# Patient Record
Sex: Female | Born: 1935
Health system: Southern US, Community
[De-identification: ages and names within clinical notes are randomized; demographics above are authoritative.]

## PROBLEM LIST (undated history)

## (undated) DIAGNOSIS — F32A Depression, unspecified: Secondary | ICD-10-CM

## (undated) DIAGNOSIS — F419 Anxiety disorder, unspecified: Secondary | ICD-10-CM

## (undated) DIAGNOSIS — M8580 Other specified disorders of bone density and structure, unspecified site: Secondary | ICD-10-CM

## (undated) DIAGNOSIS — M419 Scoliosis, unspecified: Secondary | ICD-10-CM

## (undated) DIAGNOSIS — Z8719 Personal history of other diseases of the digestive system: Secondary | ICD-10-CM

## (undated) DIAGNOSIS — K52832 Lymphocytic colitis: Secondary | ICD-10-CM

## (undated) DIAGNOSIS — I10 Essential (primary) hypertension: Secondary | ICD-10-CM

## (undated) DIAGNOSIS — K219 Gastro-esophageal reflux disease without esophagitis: Secondary | ICD-10-CM

## (undated) DIAGNOSIS — G8929 Other chronic pain: Secondary | ICD-10-CM

## (undated) DIAGNOSIS — D472 Monoclonal gammopathy: Secondary | ICD-10-CM

## (undated) DIAGNOSIS — M549 Dorsalgia, unspecified: Secondary | ICD-10-CM

## (undated) DIAGNOSIS — E785 Hyperlipidemia, unspecified: Secondary | ICD-10-CM

## (undated) DIAGNOSIS — F329 Major depressive disorder, single episode, unspecified: Secondary | ICD-10-CM

## (undated) HISTORY — DX: Other specified disorders of bone density and structure, unspecified site: M85.80

## (undated) HISTORY — DX: Hyperlipidemia, unspecified: E78.5

## (undated) HISTORY — DX: Gastro-esophageal reflux disease without esophagitis: K21.9

## (undated) HISTORY — DX: Scoliosis, unspecified: M41.9

## (undated) HISTORY — PX: HERNIA REPAIR: SHX51

## (undated) HISTORY — PX: NECK SURGERY: SHX720

## (undated) HISTORY — DX: Personal history of other diseases of the digestive system: Z87.19

## (undated) HISTORY — DX: Monoclonal gammopathy: D47.2

## (undated) HISTORY — PX: NISSEN FUNDOPLICATION: SHX2091

## (undated) HISTORY — DX: Lymphocytic colitis: K52.832

---

## 1971-07-27 HISTORY — PX: ABDOMINAL HYSTERECTOMY: SHX81

## 2011-07-08 DIAGNOSIS — K52832 Lymphocytic colitis: Secondary | ICD-10-CM | POA: Insufficient documentation

## 2011-07-22 DIAGNOSIS — Z87898 Personal history of other specified conditions: Secondary | ICD-10-CM | POA: Insufficient documentation

## 2011-07-29 DIAGNOSIS — E538 Deficiency of other specified B group vitamins: Secondary | ICD-10-CM | POA: Diagnosis not present

## 2011-08-02 DIAGNOSIS — G609 Hereditary and idiopathic neuropathy, unspecified: Secondary | ICD-10-CM | POA: Diagnosis not present

## 2011-08-02 DIAGNOSIS — R55 Syncope and collapse: Secondary | ICD-10-CM | POA: Diagnosis not present

## 2011-08-02 DIAGNOSIS — R269 Unspecified abnormalities of gait and mobility: Secondary | ICD-10-CM | POA: Diagnosis not present

## 2011-08-03 DIAGNOSIS — Z85828 Personal history of other malignant neoplasm of skin: Secondary | ICD-10-CM | POA: Diagnosis not present

## 2011-08-03 DIAGNOSIS — R21 Rash and other nonspecific skin eruption: Secondary | ICD-10-CM | POA: Diagnosis not present

## 2011-08-03 DIAGNOSIS — L570 Actinic keratosis: Secondary | ICD-10-CM | POA: Diagnosis not present

## 2011-08-04 DIAGNOSIS — R55 Syncope and collapse: Secondary | ICD-10-CM | POA: Diagnosis not present

## 2011-08-08 DIAGNOSIS — Z8719 Personal history of other diseases of the digestive system: Secondary | ICD-10-CM | POA: Insufficient documentation

## 2011-08-08 HISTORY — DX: Personal history of other diseases of the digestive system: Z87.19

## 2011-08-12 DIAGNOSIS — R55 Syncope and collapse: Secondary | ICD-10-CM | POA: Diagnosis not present

## 2011-08-18 DIAGNOSIS — R55 Syncope and collapse: Secondary | ICD-10-CM | POA: Diagnosis not present

## 2011-08-20 DIAGNOSIS — R269 Unspecified abnormalities of gait and mobility: Secondary | ICD-10-CM | POA: Diagnosis not present

## 2011-08-20 DIAGNOSIS — R55 Syncope and collapse: Secondary | ICD-10-CM | POA: Diagnosis not present

## 2011-08-20 DIAGNOSIS — G609 Hereditary and idiopathic neuropathy, unspecified: Secondary | ICD-10-CM | POA: Diagnosis not present

## 2011-08-23 DIAGNOSIS — I10 Essential (primary) hypertension: Secondary | ICD-10-CM | POA: Diagnosis not present

## 2011-08-23 DIAGNOSIS — R55 Syncope and collapse: Secondary | ICD-10-CM | POA: Diagnosis not present

## 2011-08-24 DIAGNOSIS — I10 Essential (primary) hypertension: Secondary | ICD-10-CM | POA: Diagnosis not present

## 2011-09-06 DIAGNOSIS — I10 Essential (primary) hypertension: Secondary | ICD-10-CM | POA: Diagnosis not present

## 2011-09-06 DIAGNOSIS — I672 Cerebral atherosclerosis: Secondary | ICD-10-CM | POA: Diagnosis not present

## 2011-09-06 DIAGNOSIS — R404 Transient alteration of awareness: Secondary | ICD-10-CM | POA: Diagnosis not present

## 2011-09-06 DIAGNOSIS — R269 Unspecified abnormalities of gait and mobility: Secondary | ICD-10-CM | POA: Diagnosis not present

## 2011-09-06 DIAGNOSIS — M5137 Other intervertebral disc degeneration, lumbosacral region: Secondary | ICD-10-CM | POA: Diagnosis not present

## 2011-09-07 DIAGNOSIS — D485 Neoplasm of uncertain behavior of skin: Secondary | ICD-10-CM | POA: Diagnosis not present

## 2011-09-07 DIAGNOSIS — L918 Other hypertrophic disorders of the skin: Secondary | ICD-10-CM | POA: Diagnosis not present

## 2011-09-07 DIAGNOSIS — R21 Rash and other nonspecific skin eruption: Secondary | ICD-10-CM | POA: Diagnosis not present

## 2011-09-20 DIAGNOSIS — T887XXA Unspecified adverse effect of drug or medicament, initial encounter: Secondary | ICD-10-CM | POA: Diagnosis not present

## 2011-09-27 DIAGNOSIS — E538 Deficiency of other specified B group vitamins: Secondary | ICD-10-CM | POA: Diagnosis not present

## 2011-11-08 DIAGNOSIS — Z87898 Personal history of other specified conditions: Secondary | ICD-10-CM | POA: Diagnosis not present

## 2011-11-08 DIAGNOSIS — I10 Essential (primary) hypertension: Secondary | ICD-10-CM | POA: Diagnosis not present

## 2011-11-10 DIAGNOSIS — E042 Nontoxic multinodular goiter: Secondary | ICD-10-CM | POA: Diagnosis not present

## 2011-11-10 DIAGNOSIS — R946 Abnormal results of thyroid function studies: Secondary | ICD-10-CM | POA: Diagnosis not present

## 2011-11-11 DIAGNOSIS — K5289 Other specified noninfective gastroenteritis and colitis: Secondary | ICD-10-CM | POA: Diagnosis not present

## 2011-11-11 DIAGNOSIS — I10 Essential (primary) hypertension: Secondary | ICD-10-CM | POA: Diagnosis not present

## 2011-11-11 DIAGNOSIS — N390 Urinary tract infection, site not specified: Secondary | ICD-10-CM | POA: Diagnosis not present

## 2011-11-11 DIAGNOSIS — F329 Major depressive disorder, single episode, unspecified: Secondary | ICD-10-CM | POA: Diagnosis not present

## 2011-11-15 DIAGNOSIS — R946 Abnormal results of thyroid function studies: Secondary | ICD-10-CM | POA: Diagnosis not present

## 2011-11-15 DIAGNOSIS — E042 Nontoxic multinodular goiter: Secondary | ICD-10-CM | POA: Diagnosis not present

## 2011-12-13 DIAGNOSIS — Z79899 Other long term (current) drug therapy: Secondary | ICD-10-CM | POA: Diagnosis not present

## 2011-12-13 DIAGNOSIS — I10 Essential (primary) hypertension: Secondary | ICD-10-CM | POA: Diagnosis not present

## 2011-12-14 DIAGNOSIS — E538 Deficiency of other specified B group vitamins: Secondary | ICD-10-CM | POA: Diagnosis not present

## 2011-12-14 DIAGNOSIS — D472 Monoclonal gammopathy: Secondary | ICD-10-CM | POA: Diagnosis not present

## 2011-12-14 DIAGNOSIS — E8809 Other disorders of plasma-protein metabolism, not elsewhere classified: Secondary | ICD-10-CM | POA: Diagnosis not present

## 2011-12-15 DIAGNOSIS — Z85828 Personal history of other malignant neoplasm of skin: Secondary | ICD-10-CM | POA: Diagnosis not present

## 2011-12-15 DIAGNOSIS — L570 Actinic keratosis: Secondary | ICD-10-CM | POA: Diagnosis not present

## 2011-12-15 DIAGNOSIS — C44320 Squamous cell carcinoma of skin of unspecified parts of face: Secondary | ICD-10-CM | POA: Diagnosis not present

## 2011-12-21 DIAGNOSIS — C9000 Multiple myeloma not having achieved remission: Secondary | ICD-10-CM | POA: Diagnosis not present

## 2011-12-24 DIAGNOSIS — C9000 Multiple myeloma not having achieved remission: Secondary | ICD-10-CM | POA: Diagnosis not present

## 2011-12-24 DIAGNOSIS — E860 Dehydration: Secondary | ICD-10-CM | POA: Diagnosis not present

## 2011-12-24 DIAGNOSIS — R110 Nausea: Secondary | ICD-10-CM | POA: Diagnosis not present

## 2011-12-24 DIAGNOSIS — Z5111 Encounter for antineoplastic chemotherapy: Secondary | ICD-10-CM | POA: Diagnosis not present

## 2011-12-27 DIAGNOSIS — H2510 Age-related nuclear cataract, unspecified eye: Secondary | ICD-10-CM | POA: Diagnosis not present

## 2011-12-27 DIAGNOSIS — H2589 Other age-related cataract: Secondary | ICD-10-CM | POA: Diagnosis not present

## 2011-12-27 DIAGNOSIS — H40039 Anatomical narrow angle, unspecified eye: Secondary | ICD-10-CM | POA: Diagnosis not present

## 2012-01-04 DIAGNOSIS — H40039 Anatomical narrow angle, unspecified eye: Secondary | ICD-10-CM | POA: Diagnosis not present

## 2012-01-04 DIAGNOSIS — H3540 Unspecified peripheral retinal degeneration: Secondary | ICD-10-CM | POA: Diagnosis not present

## 2012-01-04 DIAGNOSIS — H35019 Changes in retinal vascular appearance, unspecified eye: Secondary | ICD-10-CM | POA: Diagnosis not present

## 2012-02-07 DIAGNOSIS — E042 Nontoxic multinodular goiter: Secondary | ICD-10-CM | POA: Diagnosis not present

## 2012-02-10 DIAGNOSIS — M899 Disorder of bone, unspecified: Secondary | ICD-10-CM | POA: Diagnosis not present

## 2012-02-10 DIAGNOSIS — M949 Disorder of cartilage, unspecified: Secondary | ICD-10-CM | POA: Diagnosis not present

## 2012-02-10 DIAGNOSIS — E538 Deficiency of other specified B group vitamins: Secondary | ICD-10-CM | POA: Diagnosis not present

## 2012-02-10 DIAGNOSIS — C9000 Multiple myeloma not having achieved remission: Secondary | ICD-10-CM | POA: Diagnosis not present

## 2012-02-21 DIAGNOSIS — I10 Essential (primary) hypertension: Secondary | ICD-10-CM | POA: Diagnosis not present

## 2012-02-21 DIAGNOSIS — M5137 Other intervertebral disc degeneration, lumbosacral region: Secondary | ICD-10-CM | POA: Diagnosis not present

## 2012-02-21 DIAGNOSIS — I672 Cerebral atherosclerosis: Secondary | ICD-10-CM | POA: Diagnosis not present

## 2012-02-21 DIAGNOSIS — R269 Unspecified abnormalities of gait and mobility: Secondary | ICD-10-CM | POA: Diagnosis not present

## 2012-02-21 DIAGNOSIS — G4710 Hypersomnia, unspecified: Secondary | ICD-10-CM | POA: Diagnosis not present

## 2012-03-09 DIAGNOSIS — G4710 Hypersomnia, unspecified: Secondary | ICD-10-CM | POA: Diagnosis not present

## 2012-03-20 DIAGNOSIS — R946 Abnormal results of thyroid function studies: Secondary | ICD-10-CM | POA: Diagnosis not present

## 2012-03-20 DIAGNOSIS — E042 Nontoxic multinodular goiter: Secondary | ICD-10-CM | POA: Diagnosis not present

## 2012-04-06 DIAGNOSIS — C9000 Multiple myeloma not having achieved remission: Secondary | ICD-10-CM | POA: Diagnosis not present

## 2012-04-06 DIAGNOSIS — E538 Deficiency of other specified B group vitamins: Secondary | ICD-10-CM | POA: Diagnosis not present

## 2012-04-07 DIAGNOSIS — Z1231 Encounter for screening mammogram for malignant neoplasm of breast: Secondary | ICD-10-CM | POA: Diagnosis not present

## 2012-04-10 DIAGNOSIS — G4710 Hypersomnia, unspecified: Secondary | ICD-10-CM | POA: Diagnosis not present

## 2012-04-10 DIAGNOSIS — M5137 Other intervertebral disc degeneration, lumbosacral region: Secondary | ICD-10-CM | POA: Diagnosis not present

## 2012-04-10 DIAGNOSIS — I10 Essential (primary) hypertension: Secondary | ICD-10-CM | POA: Diagnosis not present

## 2012-04-10 DIAGNOSIS — I672 Cerebral atherosclerosis: Secondary | ICD-10-CM | POA: Diagnosis not present

## 2012-04-10 DIAGNOSIS — R269 Unspecified abnormalities of gait and mobility: Secondary | ICD-10-CM | POA: Diagnosis not present

## 2012-04-24 DIAGNOSIS — G4710 Hypersomnia, unspecified: Secondary | ICD-10-CM | POA: Diagnosis not present

## 2012-05-01 DIAGNOSIS — Z23 Encounter for immunization: Secondary | ICD-10-CM | POA: Diagnosis not present

## 2012-05-09 DIAGNOSIS — I10 Essential (primary) hypertension: Secondary | ICD-10-CM | POA: Diagnosis not present

## 2012-05-09 DIAGNOSIS — G4700 Insomnia, unspecified: Secondary | ICD-10-CM | POA: Diagnosis not present

## 2012-05-09 DIAGNOSIS — F411 Generalized anxiety disorder: Secondary | ICD-10-CM | POA: Diagnosis not present

## 2012-05-15 DIAGNOSIS — R269 Unspecified abnormalities of gait and mobility: Secondary | ICD-10-CM | POA: Diagnosis not present

## 2012-05-15 DIAGNOSIS — G4710 Hypersomnia, unspecified: Secondary | ICD-10-CM | POA: Diagnosis not present

## 2012-05-15 DIAGNOSIS — I10 Essential (primary) hypertension: Secondary | ICD-10-CM | POA: Diagnosis not present

## 2012-05-15 DIAGNOSIS — H8190 Unspecified disorder of vestibular function, unspecified ear: Secondary | ICD-10-CM | POA: Diagnosis not present

## 2012-05-15 DIAGNOSIS — M5137 Other intervertebral disc degeneration, lumbosacral region: Secondary | ICD-10-CM | POA: Diagnosis not present

## 2012-05-15 DIAGNOSIS — I672 Cerebral atherosclerosis: Secondary | ICD-10-CM | POA: Diagnosis not present

## 2012-06-06 DIAGNOSIS — D472 Monoclonal gammopathy: Secondary | ICD-10-CM | POA: Diagnosis not present

## 2012-06-06 DIAGNOSIS — C9000 Multiple myeloma not having achieved remission: Secondary | ICD-10-CM | POA: Diagnosis not present

## 2012-06-06 DIAGNOSIS — E538 Deficiency of other specified B group vitamins: Secondary | ICD-10-CM | POA: Diagnosis not present

## 2012-06-13 DIAGNOSIS — Z85828 Personal history of other malignant neoplasm of skin: Secondary | ICD-10-CM | POA: Diagnosis not present

## 2012-06-15 DIAGNOSIS — C9000 Multiple myeloma not having achieved remission: Secondary | ICD-10-CM | POA: Diagnosis not present

## 2012-06-15 DIAGNOSIS — D472 Monoclonal gammopathy: Secondary | ICD-10-CM | POA: Diagnosis not present

## 2012-06-15 DIAGNOSIS — E538 Deficiency of other specified B group vitamins: Secondary | ICD-10-CM | POA: Diagnosis not present

## 2012-07-04 DIAGNOSIS — H2589 Other age-related cataract: Secondary | ICD-10-CM | POA: Diagnosis not present

## 2012-07-04 DIAGNOSIS — H40039 Anatomical narrow angle, unspecified eye: Secondary | ICD-10-CM | POA: Diagnosis not present

## 2012-07-04 DIAGNOSIS — H2510 Age-related nuclear cataract, unspecified eye: Secondary | ICD-10-CM | POA: Diagnosis not present

## 2012-07-06 DIAGNOSIS — H8190 Unspecified disorder of vestibular function, unspecified ear: Secondary | ICD-10-CM | POA: Diagnosis not present

## 2012-07-10 DIAGNOSIS — H6120 Impacted cerumen, unspecified ear: Secondary | ICD-10-CM | POA: Diagnosis not present

## 2012-07-10 DIAGNOSIS — H902 Conductive hearing loss, unspecified: Secondary | ICD-10-CM | POA: Diagnosis not present

## 2012-07-13 DIAGNOSIS — D485 Neoplasm of uncertain behavior of skin: Secondary | ICD-10-CM | POA: Diagnosis not present

## 2012-07-13 DIAGNOSIS — L821 Other seborrheic keratosis: Secondary | ICD-10-CM | POA: Diagnosis not present

## 2012-07-13 DIAGNOSIS — D492 Neoplasm of unspecified behavior of bone, soft tissue, and skin: Secondary | ICD-10-CM | POA: Diagnosis not present

## 2012-08-03 DIAGNOSIS — H8190 Unspecified disorder of vestibular function, unspecified ear: Secondary | ICD-10-CM | POA: Diagnosis not present

## 2012-08-14 DIAGNOSIS — E538 Deficiency of other specified B group vitamins: Secondary | ICD-10-CM | POA: Diagnosis not present

## 2012-08-14 DIAGNOSIS — H2510 Age-related nuclear cataract, unspecified eye: Secondary | ICD-10-CM | POA: Diagnosis not present

## 2012-08-14 DIAGNOSIS — H2000 Unspecified acute and subacute iridocyclitis: Secondary | ICD-10-CM | POA: Diagnosis not present

## 2012-08-14 DIAGNOSIS — C9000 Multiple myeloma not having achieved remission: Secondary | ICD-10-CM | POA: Diagnosis not present

## 2012-08-14 DIAGNOSIS — H04129 Dry eye syndrome of unspecified lacrimal gland: Secondary | ICD-10-CM | POA: Diagnosis not present

## 2012-08-14 DIAGNOSIS — H2589 Other age-related cataract: Secondary | ICD-10-CM | POA: Diagnosis not present

## 2012-08-15 DIAGNOSIS — K589 Irritable bowel syndrome without diarrhea: Secondary | ICD-10-CM | POA: Diagnosis not present

## 2012-08-15 DIAGNOSIS — Z79899 Other long term (current) drug therapy: Secondary | ICD-10-CM | POA: Diagnosis not present

## 2012-08-17 DIAGNOSIS — R269 Unspecified abnormalities of gait and mobility: Secondary | ICD-10-CM | POA: Diagnosis not present

## 2012-08-17 DIAGNOSIS — I672 Cerebral atherosclerosis: Secondary | ICD-10-CM | POA: Diagnosis not present

## 2012-08-17 DIAGNOSIS — G4710 Hypersomnia, unspecified: Secondary | ICD-10-CM | POA: Diagnosis not present

## 2012-08-17 DIAGNOSIS — I10 Essential (primary) hypertension: Secondary | ICD-10-CM | POA: Diagnosis not present

## 2012-08-17 DIAGNOSIS — M5137 Other intervertebral disc degeneration, lumbosacral region: Secondary | ICD-10-CM | POA: Diagnosis not present

## 2012-08-17 DIAGNOSIS — H8190 Unspecified disorder of vestibular function, unspecified ear: Secondary | ICD-10-CM | POA: Diagnosis not present

## 2012-08-21 DIAGNOSIS — Z87898 Personal history of other specified conditions: Secondary | ICD-10-CM | POA: Diagnosis not present

## 2012-08-21 DIAGNOSIS — R011 Cardiac murmur, unspecified: Secondary | ICD-10-CM | POA: Diagnosis not present

## 2012-08-21 DIAGNOSIS — W19XXXA Unspecified fall, initial encounter: Secondary | ICD-10-CM | POA: Insufficient documentation

## 2012-08-21 DIAGNOSIS — I10 Essential (primary) hypertension: Secondary | ICD-10-CM | POA: Diagnosis not present

## 2012-08-22 DIAGNOSIS — H2000 Unspecified acute and subacute iridocyclitis: Secondary | ICD-10-CM | POA: Diagnosis not present

## 2012-08-29 DIAGNOSIS — Z87898 Personal history of other specified conditions: Secondary | ICD-10-CM | POA: Diagnosis not present

## 2012-08-29 DIAGNOSIS — R011 Cardiac murmur, unspecified: Secondary | ICD-10-CM | POA: Diagnosis not present

## 2012-10-20 DIAGNOSIS — Q675 Congenital deformity of spine: Secondary | ICD-10-CM | POA: Diagnosis not present

## 2012-10-20 DIAGNOSIS — F411 Generalized anxiety disorder: Secondary | ICD-10-CM | POA: Diagnosis not present

## 2012-10-20 DIAGNOSIS — I10 Essential (primary) hypertension: Secondary | ICD-10-CM | POA: Diagnosis not present

## 2012-10-20 DIAGNOSIS — Z23 Encounter for immunization: Secondary | ICD-10-CM | POA: Diagnosis not present

## 2012-12-05 DIAGNOSIS — D472 Monoclonal gammopathy: Secondary | ICD-10-CM | POA: Diagnosis not present

## 2012-12-05 DIAGNOSIS — C9000 Multiple myeloma not having achieved remission: Secondary | ICD-10-CM | POA: Diagnosis not present

## 2012-12-05 DIAGNOSIS — M810 Age-related osteoporosis without current pathological fracture: Secondary | ICD-10-CM | POA: Diagnosis not present

## 2012-12-05 DIAGNOSIS — E538 Deficiency of other specified B group vitamins: Secondary | ICD-10-CM | POA: Diagnosis not present

## 2012-12-12 DIAGNOSIS — M545 Low back pain: Secondary | ICD-10-CM | POA: Diagnosis not present

## 2012-12-19 DIAGNOSIS — D472 Monoclonal gammopathy: Secondary | ICD-10-CM | POA: Diagnosis not present

## 2012-12-19 DIAGNOSIS — C9000 Multiple myeloma not having achieved remission: Secondary | ICD-10-CM | POA: Diagnosis not present

## 2012-12-19 DIAGNOSIS — M810 Age-related osteoporosis without current pathological fracture: Secondary | ICD-10-CM | POA: Diagnosis not present

## 2012-12-19 DIAGNOSIS — E538 Deficiency of other specified B group vitamins: Secondary | ICD-10-CM | POA: Diagnosis not present

## 2012-12-25 ENCOUNTER — Ambulatory Visit: Payer: Self-pay | Admitting: Family Medicine

## 2012-12-25 DIAGNOSIS — M4120 Other idiopathic scoliosis, site unspecified: Secondary | ICD-10-CM | POA: Diagnosis not present

## 2012-12-25 DIAGNOSIS — M25559 Pain in unspecified hip: Secondary | ICD-10-CM | POA: Diagnosis not present

## 2012-12-25 DIAGNOSIS — M545 Low back pain, unspecified: Secondary | ICD-10-CM | POA: Diagnosis not present

## 2012-12-28 DIAGNOSIS — F341 Dysthymic disorder: Secondary | ICD-10-CM | POA: Diagnosis not present

## 2013-01-04 DIAGNOSIS — R1013 Epigastric pain: Secondary | ICD-10-CM | POA: Diagnosis not present

## 2013-01-04 DIAGNOSIS — M545 Low back pain: Secondary | ICD-10-CM | POA: Diagnosis not present

## 2013-01-08 ENCOUNTER — Ambulatory Visit: Payer: Self-pay | Admitting: Family Medicine

## 2013-01-08 DIAGNOSIS — R1013 Epigastric pain: Secondary | ICD-10-CM | POA: Diagnosis not present

## 2013-01-10 DIAGNOSIS — R1013 Epigastric pain: Secondary | ICD-10-CM | POA: Diagnosis not present

## 2013-01-16 DIAGNOSIS — F341 Dysthymic disorder: Secondary | ICD-10-CM | POA: Diagnosis not present

## 2013-01-16 DIAGNOSIS — G4700 Insomnia, unspecified: Secondary | ICD-10-CM | POA: Diagnosis not present

## 2013-01-16 DIAGNOSIS — K219 Gastro-esophageal reflux disease without esophagitis: Secondary | ICD-10-CM | POA: Diagnosis not present

## 2013-01-25 DIAGNOSIS — F411 Generalized anxiety disorder: Secondary | ICD-10-CM | POA: Diagnosis not present

## 2013-01-25 DIAGNOSIS — F329 Major depressive disorder, single episode, unspecified: Secondary | ICD-10-CM | POA: Diagnosis not present

## 2013-02-01 DIAGNOSIS — H2510 Age-related nuclear cataract, unspecified eye: Secondary | ICD-10-CM | POA: Diagnosis not present

## 2013-02-01 DIAGNOSIS — H2589 Other age-related cataract: Secondary | ICD-10-CM | POA: Diagnosis not present

## 2013-02-01 DIAGNOSIS — H35019 Changes in retinal vascular appearance, unspecified eye: Secondary | ICD-10-CM | POA: Diagnosis not present

## 2013-02-08 DIAGNOSIS — K219 Gastro-esophageal reflux disease without esophagitis: Secondary | ICD-10-CM | POA: Diagnosis not present

## 2013-02-22 DIAGNOSIS — F329 Major depressive disorder, single episode, unspecified: Secondary | ICD-10-CM | POA: Diagnosis not present

## 2013-02-22 DIAGNOSIS — F411 Generalized anxiety disorder: Secondary | ICD-10-CM | POA: Diagnosis not present

## 2013-04-02 DIAGNOSIS — F329 Major depressive disorder, single episode, unspecified: Secondary | ICD-10-CM | POA: Diagnosis not present

## 2013-04-02 DIAGNOSIS — F411 Generalized anxiety disorder: Secondary | ICD-10-CM | POA: Diagnosis not present

## 2013-04-04 DIAGNOSIS — Z23 Encounter for immunization: Secondary | ICD-10-CM | POA: Diagnosis not present

## 2013-05-11 DIAGNOSIS — R0789 Other chest pain: Secondary | ICD-10-CM | POA: Diagnosis not present

## 2013-06-18 DIAGNOSIS — M545 Low back pain: Secondary | ICD-10-CM | POA: Diagnosis not present

## 2013-06-18 DIAGNOSIS — J329 Chronic sinusitis, unspecified: Secondary | ICD-10-CM | POA: Diagnosis not present

## 2013-06-18 DIAGNOSIS — G8929 Other chronic pain: Secondary | ICD-10-CM | POA: Diagnosis not present

## 2013-06-19 ENCOUNTER — Ambulatory Visit: Payer: Self-pay | Admitting: Family Medicine

## 2013-06-19 DIAGNOSIS — G8929 Other chronic pain: Secondary | ICD-10-CM | POA: Diagnosis not present

## 2013-06-19 DIAGNOSIS — M4120 Other idiopathic scoliosis, site unspecified: Secondary | ICD-10-CM | POA: Diagnosis not present

## 2013-06-19 DIAGNOSIS — M545 Low back pain, unspecified: Secondary | ICD-10-CM | POA: Diagnosis not present

## 2013-06-19 DIAGNOSIS — M47814 Spondylosis without myelopathy or radiculopathy, thoracic region: Secondary | ICD-10-CM | POA: Diagnosis not present

## 2013-06-28 DIAGNOSIS — R0789 Other chest pain: Secondary | ICD-10-CM | POA: Diagnosis not present

## 2013-06-28 DIAGNOSIS — I359 Nonrheumatic aortic valve disorder, unspecified: Secondary | ICD-10-CM | POA: Diagnosis not present

## 2013-06-28 DIAGNOSIS — F321 Major depressive disorder, single episode, moderate: Secondary | ICD-10-CM | POA: Diagnosis not present

## 2013-06-28 DIAGNOSIS — R55 Syncope and collapse: Secondary | ICD-10-CM | POA: Diagnosis not present

## 2013-06-28 DIAGNOSIS — I119 Hypertensive heart disease without heart failure: Secondary | ICD-10-CM | POA: Diagnosis not present

## 2013-06-28 DIAGNOSIS — F411 Generalized anxiety disorder: Secondary | ICD-10-CM | POA: Diagnosis not present

## 2013-07-10 DIAGNOSIS — H6120 Impacted cerumen, unspecified ear: Secondary | ICD-10-CM | POA: Diagnosis not present

## 2013-07-10 DIAGNOSIS — J0110 Acute frontal sinusitis, unspecified: Secondary | ICD-10-CM | POA: Diagnosis not present

## 2013-07-30 DIAGNOSIS — I209 Angina pectoris, unspecified: Secondary | ICD-10-CM | POA: Diagnosis not present

## 2013-07-30 DIAGNOSIS — R55 Syncope and collapse: Secondary | ICD-10-CM | POA: Diagnosis not present

## 2013-07-30 DIAGNOSIS — I059 Rheumatic mitral valve disease, unspecified: Secondary | ICD-10-CM | POA: Diagnosis not present

## 2013-07-30 DIAGNOSIS — R0789 Other chest pain: Secondary | ICD-10-CM | POA: Diagnosis not present

## 2013-07-30 DIAGNOSIS — I119 Hypertensive heart disease without heart failure: Secondary | ICD-10-CM | POA: Diagnosis not present

## 2013-07-30 DIAGNOSIS — I359 Nonrheumatic aortic valve disorder, unspecified: Secondary | ICD-10-CM | POA: Diagnosis not present

## 2013-07-30 DIAGNOSIS — R0602 Shortness of breath: Secondary | ICD-10-CM | POA: Diagnosis not present

## 2013-08-20 DIAGNOSIS — M545 Low back pain, unspecified: Secondary | ICD-10-CM | POA: Diagnosis not present

## 2013-08-20 DIAGNOSIS — R0989 Other specified symptoms and signs involving the circulatory and respiratory systems: Secondary | ICD-10-CM | POA: Diagnosis not present

## 2013-09-06 DIAGNOSIS — H2510 Age-related nuclear cataract, unspecified eye: Secondary | ICD-10-CM | POA: Diagnosis not present

## 2013-09-06 DIAGNOSIS — H2589 Other age-related cataract: Secondary | ICD-10-CM | POA: Diagnosis not present

## 2013-10-31 DIAGNOSIS — H2510 Age-related nuclear cataract, unspecified eye: Secondary | ICD-10-CM | POA: Diagnosis not present

## 2013-11-22 DIAGNOSIS — M4120 Other idiopathic scoliosis, site unspecified: Secondary | ICD-10-CM | POA: Diagnosis not present

## 2013-11-22 DIAGNOSIS — F341 Dysthymic disorder: Secondary | ICD-10-CM | POA: Diagnosis not present

## 2013-11-22 DIAGNOSIS — G8929 Other chronic pain: Secondary | ICD-10-CM | POA: Diagnosis not present

## 2013-11-22 DIAGNOSIS — M545 Low back pain, unspecified: Secondary | ICD-10-CM | POA: Diagnosis not present

## 2014-02-25 DIAGNOSIS — I10 Essential (primary) hypertension: Secondary | ICD-10-CM | POA: Diagnosis not present

## 2014-02-25 DIAGNOSIS — R198 Other specified symptoms and signs involving the digestive system and abdomen: Secondary | ICD-10-CM | POA: Diagnosis not present

## 2014-03-11 DIAGNOSIS — I10 Essential (primary) hypertension: Secondary | ICD-10-CM | POA: Diagnosis not present

## 2014-03-21 DIAGNOSIS — L820 Inflamed seborrheic keratosis: Secondary | ICD-10-CM | POA: Diagnosis not present

## 2014-03-21 DIAGNOSIS — L578 Other skin changes due to chronic exposure to nonionizing radiation: Secondary | ICD-10-CM | POA: Diagnosis not present

## 2014-03-21 DIAGNOSIS — Z85828 Personal history of other malignant neoplasm of skin: Secondary | ICD-10-CM | POA: Diagnosis not present

## 2014-03-21 DIAGNOSIS — Z1283 Encounter for screening for malignant neoplasm of skin: Secondary | ICD-10-CM | POA: Diagnosis not present

## 2014-03-21 DIAGNOSIS — L570 Actinic keratosis: Secondary | ICD-10-CM | POA: Diagnosis not present

## 2014-04-23 DIAGNOSIS — Z23 Encounter for immunization: Secondary | ICD-10-CM | POA: Diagnosis not present

## 2014-05-13 DIAGNOSIS — M25472 Effusion, left ankle: Secondary | ICD-10-CM | POA: Diagnosis not present

## 2014-05-14 ENCOUNTER — Ambulatory Visit: Payer: Self-pay | Admitting: Family Medicine

## 2014-05-14 DIAGNOSIS — M25472 Effusion, left ankle: Secondary | ICD-10-CM | POA: Diagnosis not present

## 2014-05-14 DIAGNOSIS — M25572 Pain in left ankle and joints of left foot: Secondary | ICD-10-CM | POA: Diagnosis not present

## 2014-05-14 DIAGNOSIS — M7989 Other specified soft tissue disorders: Secondary | ICD-10-CM | POA: Diagnosis not present

## 2014-05-16 DIAGNOSIS — R600 Localized edema: Secondary | ICD-10-CM | POA: Diagnosis not present

## 2014-05-16 DIAGNOSIS — I10 Essential (primary) hypertension: Secondary | ICD-10-CM | POA: Diagnosis not present

## 2014-05-23 DIAGNOSIS — M79605 Pain in left leg: Secondary | ICD-10-CM | POA: Diagnosis not present

## 2014-05-23 DIAGNOSIS — M25572 Pain in left ankle and joints of left foot: Secondary | ICD-10-CM | POA: Diagnosis not present

## 2014-05-23 DIAGNOSIS — T148 Other injury of unspecified body region: Secondary | ICD-10-CM | POA: Diagnosis not present

## 2014-05-28 ENCOUNTER — Ambulatory Visit: Payer: Self-pay | Admitting: Family Medicine

## 2014-05-28 DIAGNOSIS — M79662 Pain in left lower leg: Secondary | ICD-10-CM | POA: Diagnosis not present

## 2014-05-28 DIAGNOSIS — M79605 Pain in left leg: Secondary | ICD-10-CM | POA: Diagnosis not present

## 2014-05-28 DIAGNOSIS — M25572 Pain in left ankle and joints of left foot: Secondary | ICD-10-CM | POA: Diagnosis not present

## 2014-05-31 ENCOUNTER — Emergency Department: Payer: Self-pay | Admitting: Emergency Medicine

## 2014-05-31 DIAGNOSIS — I10 Essential (primary) hypertension: Secondary | ICD-10-CM | POA: Diagnosis not present

## 2014-05-31 DIAGNOSIS — R112 Nausea with vomiting, unspecified: Secondary | ICD-10-CM | POA: Diagnosis not present

## 2014-05-31 DIAGNOSIS — R51 Headache: Secondary | ICD-10-CM | POA: Diagnosis not present

## 2014-05-31 DIAGNOSIS — Z79899 Other long term (current) drug therapy: Secondary | ICD-10-CM | POA: Diagnosis not present

## 2014-05-31 DIAGNOSIS — Z79891 Long term (current) use of opiate analgesic: Secondary | ICD-10-CM | POA: Diagnosis not present

## 2014-05-31 LAB — CBC WITH DIFFERENTIAL/PLATELET
BASOS PCT: 0.4 %
Basophil #: 0 10*3/uL (ref 0.0–0.1)
EOS PCT: 0.1 %
Eosinophil #: 0 10*3/uL (ref 0.0–0.7)
HCT: 46.8 % (ref 35.0–47.0)
HGB: 15.7 g/dL (ref 12.0–16.0)
LYMPHS ABS: 1.3 10*3/uL (ref 1.0–3.6)
LYMPHS PCT: 14.7 %
MCH: 33.8 pg (ref 26.0–34.0)
MCHC: 33.6 g/dL (ref 32.0–36.0)
MCV: 101 fL — ABNORMAL HIGH (ref 80–100)
MONO ABS: 0.3 x10 3/mm (ref 0.2–0.9)
MONOS PCT: 3.7 %
NEUTROS ABS: 7.1 10*3/uL — AB (ref 1.4–6.5)
Neutrophil %: 81.1 %
PLATELETS: 272 10*3/uL (ref 150–440)
RBC: 4.65 10*6/uL (ref 3.80–5.20)
RDW: 13.2 % (ref 11.5–14.5)
WBC: 8.7 10*3/uL (ref 3.6–11.0)

## 2014-05-31 LAB — COMPREHENSIVE METABOLIC PANEL
ALBUMIN: 3.4 g/dL (ref 3.4–5.0)
ALK PHOS: 65 U/L
ANION GAP: 9 (ref 7–16)
BILIRUBIN TOTAL: 0.5 mg/dL (ref 0.2–1.0)
BUN: 22 mg/dL — ABNORMAL HIGH (ref 7–18)
CHLORIDE: 99 mmol/L (ref 98–107)
Calcium, Total: 9.4 mg/dL (ref 8.5–10.1)
Co2: 29 mmol/L (ref 21–32)
Creatinine: 0.75 mg/dL (ref 0.60–1.30)
EGFR (African American): >60
Glucose: 132 mg/dL — ABNORMAL HIGH (ref 65–99)
OSMOLALITY: 279 (ref 275–301)
POTASSIUM: 4 mmol/L (ref 3.5–5.1)
SGOT(AST): 22 U/L (ref 15–37)
SGPT (ALT): 18 U/L
Sodium: 137 mmol/L (ref 136–145)
Total Protein: 7.6 g/dL (ref 6.4–8.2)

## 2014-05-31 LAB — TROPONIN I: Troponin-I: <0.02 ng/mL

## 2014-05-31 LAB — LIPASE, BLOOD: Lipase: 58 U/L — ABNORMAL LOW (ref 73–393)

## 2014-06-11 DIAGNOSIS — B351 Tinea unguium: Secondary | ICD-10-CM | POA: Diagnosis not present

## 2014-06-11 DIAGNOSIS — F418 Other specified anxiety disorders: Secondary | ICD-10-CM | POA: Diagnosis not present

## 2014-07-01 ENCOUNTER — Encounter: Payer: Self-pay | Admitting: Podiatry

## 2014-07-01 ENCOUNTER — Ambulatory Visit (INDEPENDENT_AMBULATORY_CARE_PROVIDER_SITE_OTHER): Payer: Medicare Other | Admitting: Podiatry

## 2014-07-01 VITALS — BP 119/72 | HR 97 | Resp 16 | Ht <= 58 in | Wt 161.0 lb

## 2014-07-01 DIAGNOSIS — B351 Tinea unguium: Secondary | ICD-10-CM | POA: Diagnosis not present

## 2014-07-01 DIAGNOSIS — M79676 Pain in unspecified toe(s): Secondary | ICD-10-CM | POA: Diagnosis not present

## 2014-07-01 NOTE — Progress Notes (Signed)
   Subjective:    Patient ID: Cindy Estes, female    DOB: 02-Mar-1924, 80 y.o.   MRN: [id number]  HPI Comments: All of her toenails need a cut. They are thick and have been like this for a while. They hurt with shoes. i have tried nail fungus stuff over the counter. My daughter trims my nails.     Review of Systems  Cardiovascular: Positive for leg swelling.  Genitourinary: Positive for frequency.  Musculoskeletal: Positive for back pain.       Difficulty walking Muscle pain  Skin:       Change in nails  Hematological: Bruises/bleeds easily.  All other systems reviewed and are negative.      Objective:   Physical Exam: I have reviewed her past medical history medications allergies surgery social history and review of systems. Pulses are strongly palpable bilateral. Neurologic sensorium is intact per Semmes-Weinstein monofilament. Deep tendon reflexes are intact bilateral muscle strength +5 over 5 dorsiflexion plantar flexors and inverters and everters on the musculature is intact. Orthopedic evaluation demonstrates all joints distal to the ankle have full range of motion without crepitation. She has pain on palpation and range of motion of the lesser toes due to hammertoe deformities. She also has thick yellow dystrophic onychomycotic nails were sharply incurvated margins. No signs of clinical bacterial infection for one infection can be ruled out.        Assessment & Plan:  Assessment: Hammertoe deformities with pain limb secondary to onychomycosis bilateral.  Plan: Debridement of all reactive hyperkeratosis and debridement of nails 1 through 5 bilateral.

## 2014-08-06 DIAGNOSIS — M545 Low back pain: Secondary | ICD-10-CM | POA: Diagnosis not present

## 2014-08-06 DIAGNOSIS — E538 Deficiency of other specified B group vitamins: Secondary | ICD-10-CM | POA: Diagnosis not present

## 2014-08-06 DIAGNOSIS — R238 Other skin changes: Secondary | ICD-10-CM | POA: Diagnosis not present

## 2014-08-06 DIAGNOSIS — G8929 Other chronic pain: Secondary | ICD-10-CM | POA: Diagnosis not present

## 2014-08-06 DIAGNOSIS — F418 Other specified anxiety disorders: Secondary | ICD-10-CM | POA: Diagnosis not present

## 2014-09-30 ENCOUNTER — Ambulatory Visit (INDEPENDENT_AMBULATORY_CARE_PROVIDER_SITE_OTHER): Payer: Medicare Other | Admitting: Podiatry

## 2014-09-30 DIAGNOSIS — M79676 Pain in unspecified toe(s): Secondary | ICD-10-CM | POA: Diagnosis not present

## 2014-09-30 DIAGNOSIS — B351 Tinea unguium: Secondary | ICD-10-CM

## 2014-09-30 NOTE — Progress Notes (Signed)
S: Pt.. Returns noting thick/uncomfortable nails.  O: Exam reveals multiple thick/dystrophic/mycotic tender nails 1-5 B;      Pulses intact with some mild pitting edema in ankles. No ulcers or skin breakdown.  A: multiple elongated/painful mycotic nails  1-5 B.  P: debridement of painful mycotic nails 1-5 B. No iatrogenic bleeding seen.

## 2014-10-25 DIAGNOSIS — R011 Cardiac murmur, unspecified: Secondary | ICD-10-CM | POA: Diagnosis not present

## 2014-10-25 DIAGNOSIS — M545 Low back pain: Secondary | ICD-10-CM | POA: Diagnosis not present

## 2014-10-25 DIAGNOSIS — N3941 Urge incontinence: Secondary | ICD-10-CM | POA: Diagnosis not present

## 2014-10-25 DIAGNOSIS — G8929 Other chronic pain: Secondary | ICD-10-CM | POA: Diagnosis not present

## 2014-11-11 DIAGNOSIS — R312 Other microscopic hematuria: Secondary | ICD-10-CM | POA: Diagnosis not present

## 2014-11-11 DIAGNOSIS — R32 Unspecified urinary incontinence: Secondary | ICD-10-CM | POA: Diagnosis not present

## 2014-11-11 DIAGNOSIS — N362 Urethral caruncle: Secondary | ICD-10-CM | POA: Diagnosis not present

## 2014-11-11 DIAGNOSIS — E669 Obesity, unspecified: Secondary | ICD-10-CM | POA: Diagnosis not present

## 2014-11-11 DIAGNOSIS — I10 Essential (primary) hypertension: Secondary | ICD-10-CM | POA: Diagnosis not present

## 2014-11-25 DIAGNOSIS — G8929 Other chronic pain: Secondary | ICD-10-CM | POA: Diagnosis not present

## 2014-11-25 DIAGNOSIS — M545 Low back pain: Secondary | ICD-10-CM | POA: Diagnosis not present

## 2014-12-02 DIAGNOSIS — I10 Essential (primary) hypertension: Secondary | ICD-10-CM | POA: Diagnosis not present

## 2014-12-02 DIAGNOSIS — R312 Other microscopic hematuria: Secondary | ICD-10-CM | POA: Diagnosis not present

## 2014-12-02 DIAGNOSIS — R32 Unspecified urinary incontinence: Secondary | ICD-10-CM | POA: Diagnosis not present

## 2014-12-02 DIAGNOSIS — E669 Obesity, unspecified: Secondary | ICD-10-CM | POA: Diagnosis not present

## 2014-12-02 DIAGNOSIS — N362 Urethral caruncle: Secondary | ICD-10-CM | POA: Diagnosis not present

## 2014-12-05 ENCOUNTER — Emergency Department
Admission: EM | Admit: 2014-12-05 | Discharge: 2014-12-05 | Disposition: A | Discharge status: Home / Self Care | Payer: Medicare Other | Attending: Emergency Medicine | Admitting: Emergency Medicine

## 2014-12-05 ENCOUNTER — Encounter: Payer: Self-pay | Admitting: Emergency Medicine

## 2014-12-05 DIAGNOSIS — G8929 Other chronic pain: Secondary | ICD-10-CM | POA: Insufficient documentation

## 2014-12-05 DIAGNOSIS — R110 Nausea: Secondary | ICD-10-CM | POA: Diagnosis not present

## 2014-12-05 DIAGNOSIS — R109 Unspecified abdominal pain: Secondary | ICD-10-CM | POA: Diagnosis not present

## 2014-12-05 DIAGNOSIS — K5900 Constipation, unspecified: Secondary | ICD-10-CM | POA: Diagnosis not present

## 2014-12-05 DIAGNOSIS — Z7982 Long term (current) use of aspirin: Secondary | ICD-10-CM | POA: Diagnosis not present

## 2014-12-05 DIAGNOSIS — Z79899 Other long term (current) drug therapy: Secondary | ICD-10-CM | POA: Diagnosis not present

## 2014-12-05 DIAGNOSIS — I10 Essential (primary) hypertension: Secondary | ICD-10-CM | POA: Diagnosis not present

## 2014-12-05 DIAGNOSIS — R1084 Generalized abdominal pain: Secondary | ICD-10-CM | POA: Insufficient documentation

## 2014-12-05 HISTORY — DX: Anxiety disorder, unspecified: F41.9

## 2014-12-05 HISTORY — DX: Other chronic pain: G89.29

## 2014-12-05 HISTORY — DX: Major depressive disorder, single episode, unspecified: F32.9

## 2014-12-05 HISTORY — DX: Dorsalgia, unspecified: M54.9

## 2014-12-05 HISTORY — DX: Depression, unspecified: F32.A

## 2014-12-05 HISTORY — DX: Essential (primary) hypertension: I10

## 2014-12-05 NOTE — ED Provider Notes (Signed)
[facility] Emergency Department Provider Note  ____________________________________________  Time seen: Approximately 6:22 PM  I have reviewed the triage vital signs and the nursing notes.   HISTORY  Chief Complaint Constipation  History by patient and her daughter.  HPI Cindy Estes is a 81 y.o. female with a history that includes severe scoliosis on chronic pain medicines who resents with several days of decreased frequency of bowel movements and some abdominal discomfort that she describes as moderate this morning but is much better now.  She recently increased the amount of outpatient pain medicine she takes and was placed on a stool softener, but this was only over the last several days.  She had a bowel movement yesterday and the day before that but it was small volume.  She states that she feels much better now and her abdomen is not hurting.  She has not had any other symptoms and has no other complaints at this time.   Past Medical History  Diagnosis Date  . Hypertension   . Depression unk  . Anxiety unk  . Chronic back pain unk    There are no active problems to display for this patient.   History reviewed. No pertinent past surgical history.  Current Outpatient Rx  Name  Route  Sig  Dispense  Refill  . acetaminophen-codeine (TYLENOL #3) 300-30 MG per tablet   Oral   Take 1 tablet by mouth as needed for moderate pain.         Marland Kitchen ALPRAZolam (XANAX) 0.5 MG tablet   Oral   Take by mouth.         Marland Kitchen aspirin EC 81 MG tablet   Oral   Take by mouth.         . Cholecalciferol (VITAMIN D-1000 MAX ST) 1000 UNITS tablet   Oral   Take by mouth.         . valsartan-hydrochlorothiazide (DIOVAN-HCT) 160-12.5 MG per tablet   Oral   Take by mouth.         . venlafaxine XR (EFFEXOR-XR) 75 MG 24 hr capsule                 Allergies Review of patient's allergies indicates no known allergies.  No family history on  file.  Social History History  Substance Use Topics  . Smoking status: Never Smoker   . Smokeless tobacco: Not on file  . Alcohol Use: No    Review of Systems Constitutional: No fever/chills Eyes: No visual changes. ENT: No sore throat. Cardiovascular: Denies chest pain. Respiratory: Denies shortness of breath. Gastrointestinal: No abdominal pain currently but she did have some earlier this morning.  No nausea, no vomiting.  No diarrhea.  No constipation. Genitourinary: Negative for dysuria. Musculoskeletal: Negative for back pain. Skin: Negative for rash. Neurological: Chronic right-sided sciatica  10-point ROS otherwise negative.  ____________________________________________   PHYSICAL EXAM:  VITAL SIGNS: ED Triage Vitals  Enc Vitals Group     BP 12/05/14 1405 144/73 mmHg     Pulse Rate 12/05/14 1405 95     Resp 12/05/14 1405 20     Temp 12/05/14 1405 97.9 F (36.6 C)     Temp Source 12/05/14 1405 Oral     SpO2 12/05/14 1405 96 %     Weight 12/05/14 1405 165 lb (74.844 kg)     Height 12/05/14 1405 4\' 10"  (1.473 m)     Head Cir --      Peak Flow --  Pain Score 12/05/14 1406 5     Pain Loc --      Pain Edu? --      Excl. in [place]? --     Constitutional: Alert and oriented. Well appearing and in no acute distress. Eyes: Conjunctivae are normal. PERRL. EOMI. Head: Atraumatic. Nose: No congestion/rhinnorhea. Mouth/Throat: Mucous membranes are moist.  Oropharynx non-erythematous. Neck: No stridor.   Cardiovascular: Normal rate, regular rhythm. Grossly normal heart sounds.  Good peripheral circulation. Respiratory: Normal respiratory effort.  No retractions. Lungs CTAB. Gastrointestinal: Soft and nontender. No distention. No abdominal bruits. No CVA tenderness. Musculoskeletal: No lower extremity tenderness nor edema.  No joint effusions.  Severe scoliosis Neurologic:  Normal speech and language. No gross focal neurologic deficits are appreciated. Speech is  normal. No gait instability. Skin:  Skin is warm, dry and intact. No rash noted. Psychiatric: Mood and affect are normal. Speech and behavior are normal.  ____________________________________________   LABS (all labs ordered are listed, but only abnormal results are displayed)  Labs Reviewed - No data to display ____________________________________________   PROCEDURES  Procedure(s) performed: None  Critical Care performed: No  ____________________________________________   INITIAL IMPRESSION / ASSESSMENT AND PLAN / ED COURSE  Pertinent labs & imaging results that were available during my care of the patient were reviewed by me and considered in my medical decision making (see chart for details).  When I evaluated the patient she stated that she felt much better.  Her abdominal exam was reassuring.  I do not believe that she needs a CT scan, though I offered one, but her daughter agrees that it is not necessary.  I offered rectal exam with disimpaction, plain films, oral laxatives, and/or an enema, and the patient and her daughter declined all interventions.  I will discharge them with my usual constipation and abdominal pain information and return precautions.  She will follow up with her primary care doctor at the next available appointment. ____________________________________________   FINAL CLINICAL IMPRESSION(S) / ED DIAGNOSES  Final diagnoses:  Constipation, unspecified constipation type  Generalized abdominal pain     Hinda Kehr, MD 12/05/14 1740

## 2014-12-05 NOTE — Discharge Instructions (Signed)
You were seen in the emergency department today for constipation.  We recommend that you use one or more of the following over-the-counter medications in the order described:   1)  Colace (or Dulcolax) 100 mg:  This is a stool softener, and you may take it once or twice a day as needed. 2)  Senna tablets:  This is a bowel stimulant that will help "push" out your stool. It is the next step to add after you have tried a stool softener. 3)  Miralax (powder):  This medication works by drawing additional fluid into your intestines and helps to flush out your stool.  Mix the powder with water or juice according to label instructions.  It may help if the Colace and Senna are not sufficient, but you must be sure to use the recommended amount of water or juice when you mix up the powder. Remember that narcotic pain medications are constipating, so avoid them or minimize their use.  Drink plenty of fluids.  Please return to the Emergency Department immediately if you develop new or worsening symptoms that concern you, such as (but not limited to) fever > 101 degrees, severe abdominal pain, or persistent vomiting.   Abdominal Pain Many things can cause abdominal pain. Usually, abdominal pain is not caused by a disease and will improve without treatment. It can often be observed and treated at home. Your health care provider will do a physical exam and possibly order blood tests and X-rays to help determine the seriousness of your pain. However, in many cases, more time must pass before a clear cause of the pain can be found. Before that point, your health care provider may not know if you need more testing or further treatment. HOME CARE INSTRUCTIONS  Monitor your abdominal pain for any changes. The following actions may help to alleviate any discomfort you are experiencing:  Only take over-the-counter or prescription medicines as directed by your health care provider.  Do not take laxatives unless directed to  do so by your health care provider.  Try a clear liquid diet (broth, tea, or water) as directed by your health care provider. Slowly move to a bland diet as tolerated. SEEK MEDICAL CARE IF:  You have unexplained abdominal pain.  You have abdominal pain associated with nausea or diarrhea.  You have pain when you urinate or have a bowel movement.  You experience abdominal pain that wakes you in the night.  You have abdominal pain that is worsened or improved by eating food.  You have abdominal pain that is worsened with eating fatty foods.  You have a fever. SEEK IMMEDIATE MEDICAL CARE IF:   Your pain does not go away within 2 hours.  You keep throwing up (vomiting).  Your pain is felt only in portions of the abdomen, such as the right side or the left lower portion of the abdomen.  You pass bloody or black tarry stools. MAKE SURE YOU:  Understand these instructions.   Will watch your condition.   Will get help right away if you are not doing well or get worse.  Document Released: 04/21/2005 Document Revised: 07/17/2013 Document Reviewed: 03/21/2013 [facility] Patient Information 2015 [place], [place]. This information is not intended to replace advice given to you by your health care provider. Make sure you discuss any questions you have with your health care provider.  Constipation Constipation is when a person:  Poops (has a bowel movement) less than 3 times a week.  Has a hard  time pooping.  Has poop that is dry, hard, or bigger than normal. HOME CARE   Eat foods with a lot of fiber in them. This includes fruits, vegetables, beans, and whole grains such as brown rice.  Avoid fatty foods and foods with a lot of sugar. This includes french fries, hamburgers, cookies, candy, and soda.  If you are not getting enough fiber from food, take products with added fiber in them (supplements).  Drink enough fluid to keep your pee (urine) clear or pale yellow.  Exercise on  a regular basis, or as told by your doctor.  Go to the restroom when you feel like you need to poop. Do not hold it.  Only take medicine as told by your doctor. Do not take medicines that help you poop (laxatives) without talking to your doctor first. GET HELP RIGHT AWAY IF:   You have bright red blood in your poop (stool).  Your constipation lasts more than 4 days or gets worse.  You have belly (abdominal) or butt (rectal) pain.  You have thin poop (as thin as a pencil).  You lose weight, and it cannot be explained. MAKE SURE YOU:   Understand these instructions.  Will watch your condition.  Will get help right away if you are not doing well or get worse. Document Released: 12/29/2007 Document Revised: 07/17/2013 Document Reviewed: 04/23/2013 [facility] Patient Information 2015 [place], [place]. This information is not intended to replace advice given to you by your health care provider. Make sure you discuss any questions you have with your health care provider.

## 2014-12-05 NOTE — ED Notes (Signed)
Brought in via [employer].  States she has not had bowel movement for several days. Has been taking some pain meds recently

## 2015-01-20 ENCOUNTER — Ambulatory Visit (INDEPENDENT_AMBULATORY_CARE_PROVIDER_SITE_OTHER): Payer: Medicare Other | Admitting: Podiatry

## 2015-01-20 DIAGNOSIS — M79676 Pain in unspecified toe(s): Secondary | ICD-10-CM

## 2015-01-20 DIAGNOSIS — B351 Tinea unguium: Secondary | ICD-10-CM | POA: Diagnosis not present

## 2015-01-20 NOTE — Progress Notes (Signed)
S: Pt.. Returns noting thick/uncomfortable nails.  O: Exam reveals multiple thick/dystrophic/mycotic tender nails 1-5 B;      Pulses intact with some mild pitting edema in ankles. No ulcers or skin breakdown.  A: multiple elongated/painful mycotic nails  1-5 B.  P: debridement of painful mycotic nails 1-5 B. No iatrogenic bleeding seen.

## 2015-01-31 DIAGNOSIS — H2513 Age-related nuclear cataract, bilateral: Secondary | ICD-10-CM | POA: Diagnosis not present

## 2015-02-04 ENCOUNTER — Other Ambulatory Visit: Payer: Self-pay | Admitting: Family Medicine

## 2015-02-04 DIAGNOSIS — F419 Anxiety disorder, unspecified: Principal | ICD-10-CM

## 2015-02-04 DIAGNOSIS — F329 Major depressive disorder, single episode, unspecified: Secondary | ICD-10-CM

## 2015-02-11 DIAGNOSIS — L821 Other seborrheic keratosis: Secondary | ICD-10-CM | POA: Diagnosis not present

## 2015-02-11 DIAGNOSIS — X32XXXA Exposure to sunlight, initial encounter: Secondary | ICD-10-CM | POA: Diagnosis not present

## 2015-02-11 DIAGNOSIS — L570 Actinic keratosis: Secondary | ICD-10-CM | POA: Diagnosis not present

## 2015-02-14 ENCOUNTER — Other Ambulatory Visit: Payer: Self-pay | Admitting: Family Medicine

## 2015-02-14 DIAGNOSIS — G8929 Other chronic pain: Secondary | ICD-10-CM

## 2015-02-14 DIAGNOSIS — M545 Low back pain: Principal | ICD-10-CM

## 2015-02-19 ENCOUNTER — Telehealth: Payer: Self-pay | Admitting: Family Medicine

## 2015-02-19 NOTE — Telephone Encounter (Signed)
Dawn from [facility] states that they never received the tylenol 3 prescription please refax [phone]

## 2015-02-19 NOTE — Telephone Encounter (Signed)
Spoke with patient and let her know that prescription has been printed and is here at front desk ready for pickup also informed patient to bring a photo ID. Patient verbalized understanding.

## 2015-02-26 ENCOUNTER — Telehealth: Payer: Self-pay | Admitting: Family Medicine

## 2015-02-26 NOTE — Telephone Encounter (Signed)
[employer] and spoke with Pharmacist confirmed refill for Tylenol #3 300-30mg  has been refilled

## 2015-02-26 NOTE — Telephone Encounter (Signed)
Dawn from [facility] that they received a message that a new prescription for Tylenol 4 was to follow however they never received the fax. Is it possible to refax the prescription to [phone]

## 2015-03-07 ENCOUNTER — Encounter: Payer: Self-pay | Admitting: Family Medicine

## 2015-03-07 ENCOUNTER — Ambulatory Visit (INDEPENDENT_AMBULATORY_CARE_PROVIDER_SITE_OTHER): Payer: Medicare Other | Admitting: Family Medicine

## 2015-03-07 VITALS — BP 140/78 | HR 75 | Temp 97.9°F | Resp 19 | Ht <= 58 in | Wt 164.2 lb

## 2015-03-07 DIAGNOSIS — F419 Anxiety disorder, unspecified: Secondary | ICD-10-CM | POA: Diagnosis not present

## 2015-03-07 MED ORDER — ALPRAZOLAM 1 MG PO TABS: 1.0000 mg | ORAL_TABLET | Freq: Every evening | ORAL | Status: DC | PRN

## 2015-03-07 NOTE — Progress Notes (Signed)
Name: Cindy Estes   MRN: [id number]    DOB: 09-Oct-1923   Date:03/07/2015       Progress Note  Subjective  Chief Complaint  Chief Complaint  Patient presents with  . Follow-up    3 mo/ medication refill  . Depression  . Hypertension    Anxiety Presents for follow-up visit. Symptoms include insomnia, nervous/anxious behavior and panic (1 episode of panic attack at [facility] last week.). Patient reports no chest pain, irritability or palpitations. The severity of symptoms is moderate. The quality of sleep is fair.   Her past medical history is significant for anxiety/panic attacks. Past treatments include benzodiazephines. Compliance with prior treatments has been good.      Past Medical History  Diagnosis Date  . Hypertension   . Depression unk  . Anxiety unk  . Chronic back pain unk    Past Surgical History  Procedure Laterality Date  . Abdominal hysterectomy  1973    Family History  Problem Relation Age of Onset  . Dementia Father   . Heart disease Maternal Grandmother   . Diabetes Maternal Grandfather     Social History   Social History  . Marital Status: Widowed    Spouse Name: N/A  . Number of Children: N/A  . Years of Education: N/A   Occupational History  . Not on file.   Social History Main Topics  . Smoking status: Never Smoker   . Smokeless tobacco: Not on file  . Alcohol Use: No  . Drug Use: Not on file  . Sexual Activity: Not on file   Other Topics Concern  . Not on file   Social History Narrative     Current outpatient prescriptions:  .  acetaminophen-codeine (TYLENOL #3) 300-30 MG per tablet, Take 1 tablet by mouth every 8 (eight) hours as needed for moderate pain or severe pain., Disp: 90 tablet, Rfl: 0 .  ALPRAZolam (XANAX) 1 MG tablet, Take 1 tablet by mouth daily., Disp: , Rfl:  .  aspirin EC 81 MG tablet, Take by mouth., Disp: , Rfl:  .  Cholecalciferol (VITAMIN D-1000 MAX ST) 1000 UNITS tablet, Take by mouth.,  Disp: , Rfl:  .  valsartan-hydrochlorothiazide (DIOVAN-HCT) 160-12.5 MG per tablet, Take by mouth., Disp: , Rfl:  .  venlafaxine XR (EFFEXOR-XR) 75 MG 24 hr capsule, Take 1 capsule (75 mg total) by mouth at bedtime., Disp: 90 capsule, Rfl: 1  No Known Allergies   Review of Systems  Constitutional: Negative for irritability.  Cardiovascular: Negative for chest pain and palpitations.  Musculoskeletal: Positive for back pain.  Psychiatric/Behavioral: Positive for depression. The patient is nervous/anxious and has insomnia.       Objective  Filed Vitals:   03/07/15 1049  BP: 140/78  Pulse: 75  Temp: 97.9 F (36.6 C)  TempSrc: Oral  Resp: 19  Height: 4\' 10"  (1.473 m)  Weight: 164 lb 3.2 oz (74.481 kg)  SpO2: 94%    Physical Exam  Constitutional: She is oriented to person, place, and time and well-developed, well-nourished, and in no distress.  Cardiovascular: Normal rate and regular rhythm.   Pulmonary/Chest: Effort normal and breath sounds normal.  Neurological: She is alert and oriented to person, place, and time.  Nursing note and vitals reviewed.  Assessment & Plan  1. Anxiety Symptoms are stable and controlled on alprazolam 1 mg at bedtime as needed. Patient is aware of the dependence potential of benzodiazepines. She is taking the medication as directed. Refills provided  and follow-up in 3 months. - ALPRAZolam (XANAX) 1 MG tablet; Take 1 tablet (1 mg total) by mouth at bedtime as needed for anxiety.  Dispense: 30 tablet; Refill: 2 .  Taria Castrillo [facility] 03/07/2015 11:15 AM

## 2015-04-17 DIAGNOSIS — L821 Other seborrheic keratosis: Secondary | ICD-10-CM | POA: Diagnosis not present

## 2015-04-22 ENCOUNTER — Ambulatory Visit: Payer: Medicare Other

## 2015-04-22 ENCOUNTER — Ambulatory Visit (INDEPENDENT_AMBULATORY_CARE_PROVIDER_SITE_OTHER): Payer: Medicare Other | Admitting: Podiatry

## 2015-04-22 DIAGNOSIS — B351 Tinea unguium: Secondary | ICD-10-CM

## 2015-04-22 DIAGNOSIS — M2040 Other hammer toe(s) (acquired), unspecified foot: Secondary | ICD-10-CM

## 2015-04-22 DIAGNOSIS — M79676 Pain in unspecified toe(s): Secondary | ICD-10-CM

## 2015-04-22 NOTE — Progress Notes (Signed)
Subjective: 81 y.o. returns the office today for painful, elongated, thickened toenails which she is unable to trim herself. Denies any redness or drainage around the nails. Denies any acute changes since last appointment and no new complaints today. Denies any systemic complaints such as fevers, chills, nausea, vomiting.   Objective: AAO 3, NAD DP/PT pulses palpable, CRT less than 3 seconds Nails hypertrophic, dystrophic, elongated, brittle, discolored 10. There is tenderness overlying the nails 1-5 bilaterally. There is no surrounding erythema or drainage along the nail sites. No open lesions or pre-ulcerative lesions are identified. No other areas of tenderness bilateral lower extremities. No overlying edema, erythema, increased warmth. Hammertoe contractures bilaterally. There is slight erythema around the dorsal PIPJ of the left second toe from irritation in shoe gear. No open lesion. No pain with calf compression, swelling, warmth, erythema.  Assessment: Patient presents with symptomatic onychomycosis; hammertoe  Plan: -Treatment options including alternatives, risks, complications were discussed -Nails sharply debrided 10 without complication/bleeding. -Offloading pads dispensed for hammertoes. -Discussed daily foot inspection. If there are any changes, to call the office immediately.  -Follow-up in 3 months or sooner if any problems are to arise. In the meantime, encouraged to call the office with any questions, concerns, changes symptoms.  Celesta Gentile, DPM

## 2015-04-28 ENCOUNTER — Other Ambulatory Visit: Payer: Self-pay | Admitting: Family Medicine

## 2015-05-16 DIAGNOSIS — Z23 Encounter for immunization: Secondary | ICD-10-CM | POA: Diagnosis not present

## 2015-06-09 ENCOUNTER — Encounter: Payer: Self-pay | Admitting: Family Medicine

## 2015-06-09 ENCOUNTER — Ambulatory Visit (INDEPENDENT_AMBULATORY_CARE_PROVIDER_SITE_OTHER): Payer: Medicare Other | Admitting: Family Medicine

## 2015-06-09 VITALS — BP 138/80 | HR 89 | Temp 98.5°F | Resp 19 | Ht <= 58 in | Wt 164.5 lb

## 2015-06-09 DIAGNOSIS — M549 Dorsalgia, unspecified: Secondary | ICD-10-CM

## 2015-06-09 DIAGNOSIS — I10 Essential (primary) hypertension: Secondary | ICD-10-CM | POA: Insufficient documentation

## 2015-06-09 DIAGNOSIS — E538 Deficiency of other specified B group vitamins: Secondary | ICD-10-CM | POA: Insufficient documentation

## 2015-06-09 DIAGNOSIS — G8929 Other chronic pain: Secondary | ICD-10-CM | POA: Insufficient documentation

## 2015-06-09 DIAGNOSIS — F419 Anxiety disorder, unspecified: Secondary | ICD-10-CM | POA: Diagnosis not present

## 2015-06-09 DIAGNOSIS — R0789 Other chest pain: Secondary | ICD-10-CM | POA: Insufficient documentation

## 2015-06-09 DIAGNOSIS — M419 Scoliosis, unspecified: Secondary | ICD-10-CM

## 2015-06-09 DIAGNOSIS — M5440 Lumbago with sciatica, unspecified side: Secondary | ICD-10-CM | POA: Insufficient documentation

## 2015-06-09 DIAGNOSIS — M4124 Other idiopathic scoliosis, thoracic region: Secondary | ICD-10-CM

## 2015-06-09 DIAGNOSIS — R011 Cardiac murmur, unspecified: Secondary | ICD-10-CM | POA: Insufficient documentation

## 2015-06-09 DIAGNOSIS — N3941 Urge incontinence: Secondary | ICD-10-CM | POA: Insufficient documentation

## 2015-06-09 DIAGNOSIS — M545 Low back pain: Secondary | ICD-10-CM

## 2015-06-09 NOTE — Progress Notes (Signed)
Name: Cindy Estes   MRN: [id number]    DOB: 05/27/1924   Date:06/09/2015       Progress Note  Subjective  Chief Complaint  Chief Complaint  Patient presents with  . Follow-up    3 mo  . Anxiety  . Hypertension    Anxiety Presents for follow-up visit. The problem has been unchanged. Symptoms include insomnia and nervous/anxious behavior. Patient reports no chest pain, depressed mood, excessive worry, palpitations, panic or shortness of breath. Primary symptoms comment: symptoms controlled as long as she is on medication.   Her past medical history is significant for anxiety/panic attacks. There is no history of depression. Past treatments include benzodiazephines. The treatment provided significant relief. Compliance with prior treatments has been good.  Hypertension This is a chronic problem. The problem is unchanged. The problem is controlled. Associated symptoms include anxiety. Pertinent negatives include no blurred vision, chest pain, headaches, orthopnea, palpitations, shortness of breath or sweats. Past treatments include angiotensin blockers and diuretics.  Back Pain This is a chronic problem. Episode onset: chronic, thoracic scoliosis. The problem occurs daily. The problem is unchanged. The pain is present in the thoracic spine (thoracic scoliosis). The pain does not radiate. The pain is moderate. The symptoms are aggravated by lying down. Pertinent negatives include no bladder incontinence, bowel incontinence, chest pain, headaches or leg pain. She has tried analgesics (Tylenol # 3) for the symptoms.      Past Medical History  Diagnosis Date  . Hypertension   . Depression unk  . Anxiety unk  . Chronic back pain unk    Past Surgical History  Procedure Laterality Date  . Abdominal hysterectomy  1973    Family History  Problem Relation Age of Onset  . Dementia Father   . Heart disease Maternal Grandmother   . Diabetes Maternal Grandfather     Social History    Social History  . Marital Status: Widowed    Spouse Name: N/A  . Number of Children: N/A  . Years of Education: N/A   Occupational History  . Not on file.   Social History Main Topics  . Smoking status: Never Smoker   . Smokeless tobacco: Not on file  . Alcohol Use: No  . Drug Use: Not on file  . Sexual Activity: Not on file   Other Topics Concern  . Not on file   Social History Narrative     Current outpatient prescriptions:  .  acetaminophen-codeine (TYLENOL #3) 300-30 MG tablet, TAKE 1 TABLET EVERY 12 HOURS AS NEEDED FOR PAIN, Disp: 60 tablet, Rfl: 2 .  ALPRAZolam (XANAX) 1 MG tablet, Take 1 tablet (1 mg total) by mouth at bedtime as needed for anxiety., Disp: 30 tablet, Rfl: 2 .  aspirin EC 81 MG tablet, Take by mouth., Disp: , Rfl:  .  Cholecalciferol (VITAMIN D-1000 MAX ST) 1000 UNITS tablet, Take by mouth., Disp: , Rfl:  .  venlafaxine XR (EFFEXOR-XR) 75 MG 24 hr capsule, Take 1 capsule (75 mg total) by mouth at bedtime., Disp: 90 capsule, Rfl: 1 .  valsartan-hydrochlorothiazide (DIOVAN-HCT) 160-12.5 MG per tablet, Take by mouth., Disp: , Rfl:   No Known Allergies   Review of Systems  Eyes: Negative for blurred vision and double vision.  Respiratory: Negative for shortness of breath.   Cardiovascular: Negative for chest pain, palpitations and orthopnea.  Gastrointestinal: Negative for bowel incontinence.  Genitourinary: Negative for bladder incontinence.  Musculoskeletal: Positive for back pain and joint pain.  Neurological: Negative for  headaches.  Psychiatric/Behavioral: Negative for depression. The patient is nervous/anxious and has insomnia.     Objective  Filed Vitals:   06/09/15 1035  BP: 138/80  Pulse: 89  Temp: 98.5 F (36.9 C)  TempSrc: Oral  Resp: 19  Height: 4\' 10"  (1.473 m)  Weight: 164 lb 8 oz (74.617 kg)  SpO2: 96%    Physical Exam  Constitutional: She is oriented to person, place, and time and well-developed, well-nourished, and  in no distress.  HENT:  Head: Normocephalic and atraumatic.  Cardiovascular: Normal rate, regular rhythm and normal heart sounds.   No murmur heard. Pulmonary/Chest: Effort normal and breath sounds normal. She has no wheezes. She has no rales.  Musculoskeletal:  Pain in the right middle and lower back  Neurological: She is alert and oriented to person, place, and time.  Skin: She is not diaphoretic.  Psychiatric: Mood, memory, affect and judgment normal.  Nursing note and vitals reviewed.   Assessment & Plan  1. Essential hypertension BP stable and controlled on present therapy.  2. Anxiety Stable. Takes alprazolam 1 mg at bedtime for anxiety and resulting insomnia. No adverse effects reported.  3. Chronic back pain Persistent and recurrent secondary to scoliosis. Discussed various options for optimal relief including referral to neurosurgery additional non surgical interventions.she will try to schedule appointment.  4. Scoliosis of thoracic spine    Courtez Twaddle [facility] 06/09/2015 11:14 AM

## 2015-06-27 ENCOUNTER — Other Ambulatory Visit: Payer: Self-pay | Admitting: Family Medicine

## 2015-07-14 ENCOUNTER — Other Ambulatory Visit: Payer: Self-pay | Admitting: Family Medicine

## 2015-07-20 ENCOUNTER — Emergency Department
Admission: EM | Admit: 2015-07-20 | Discharge: 2015-07-20 | Disposition: A | Discharge status: Home / Self Care | Payer: Medicare Other | Attending: Emergency Medicine | Admitting: Emergency Medicine

## 2015-07-20 ENCOUNTER — Emergency Department: Payer: Medicare Other

## 2015-07-20 DIAGNOSIS — Y9389 Activity, other specified: Secondary | ICD-10-CM | POA: Insufficient documentation

## 2015-07-20 DIAGNOSIS — Y998 Other external cause status: Secondary | ICD-10-CM | POA: Diagnosis not present

## 2015-07-20 DIAGNOSIS — W01198A Fall on same level from slipping, tripping and stumbling with subsequent striking against other object, initial encounter: Secondary | ICD-10-CM | POA: Insufficient documentation

## 2015-07-20 DIAGNOSIS — R220 Localized swelling, mass and lump, head: Secondary | ICD-10-CM | POA: Diagnosis not present

## 2015-07-20 DIAGNOSIS — S0181XA Laceration without foreign body of other part of head, initial encounter: Secondary | ICD-10-CM | POA: Insufficient documentation

## 2015-07-20 DIAGNOSIS — IMO0002 Reserved for concepts with insufficient information to code with codable children: Secondary | ICD-10-CM

## 2015-07-20 DIAGNOSIS — Z7982 Long term (current) use of aspirin: Secondary | ICD-10-CM | POA: Diagnosis not present

## 2015-07-20 DIAGNOSIS — Y92009 Unspecified place in unspecified non-institutional (private) residence as the place of occurrence of the external cause: Secondary | ICD-10-CM | POA: Insufficient documentation

## 2015-07-20 DIAGNOSIS — S0990XA Unspecified injury of head, initial encounter: Secondary | ICD-10-CM

## 2015-07-20 DIAGNOSIS — W19XXXA Unspecified fall, initial encounter: Secondary | ICD-10-CM

## 2015-07-20 DIAGNOSIS — Z79899 Other long term (current) drug therapy: Secondary | ICD-10-CM | POA: Insufficient documentation

## 2015-07-20 MED ORDER — TETANUS-DIPHTH-ACELL PERTUSSIS 5-2.5-18.5 LF-MCG/0.5 IM SUSP
INTRAMUSCULAR | Status: AC
  Administered 2015-07-20: 0.5 mL via INTRAMUSCULAR
  Filled 2015-07-20: qty 0.5

## 2015-07-20 MED ORDER — LIDOCAINE-EPINEPHRINE 1 %-1:100000 IJ SOLN: 10.0000 mL | Freq: Once | INTRAMUSCULAR | Status: DC

## 2015-07-20 MED ORDER — TETANUS-DIPHTHERIA TOXOIDS TD 5-2 LFU IM INJ: 0.5000 mL | INJECTION | Freq: Once | INTRAMUSCULAR | Status: DC

## 2015-07-20 MED ORDER — LIDOCAINE-EPINEPHRINE (PF) 1 %-1:200000 IJ SOLN
INTRAMUSCULAR | Status: AC
  Administered 2015-07-20: 30 mL via INTRADERMAL
  Filled 2015-07-20: qty 30

## 2015-07-20 MED ORDER — LIDOCAINE-EPINEPHRINE (PF) 1 %-1:200000 IJ SOLN
30.0000 mL | Freq: Once | INTRAMUSCULAR | Status: AC
  Administered 2015-07-20: 30 mL via INTRADERMAL

## 2015-07-20 NOTE — Discharge Instructions (Signed)
1. Suture removal in 5 days. 2. Your tetanus has been updated and will be good for another 10 years. 3. Return to the ER for worsening symptoms, persistent vomiting, lethargy or other concerns.  Head Injury, Adult You have a head injury. Headaches and throwing up (vomiting) are common after a head injury. It should be easy to wake up from sleeping. Sometimes you must stay in the hospital. Most problems happen within the first 24 hours. Side effects may occur up to 7-10 days after the injury.  WHAT ARE THE TYPES OF HEAD INJURIES? Head injuries can be as minor as a bump. Some head injuries can be more severe. More severe head injuries include:  A jarring injury to the brain (concussion).  A bruise of the brain (contusion). This mean there is bleeding in the brain that can cause swelling.  A cracked skull (skull fracture).  Bleeding in the brain that collects, clots, and forms a bump (hematoma). WHEN SHOULD I GET HELP RIGHT AWAY?   You are confused or sleepy.  You cannot be woken up.  You feel sick to your stomach (nauseous) or keep throwing up (vomiting).  Your dizziness or unsteadiness is getting worse.  You have very bad, lasting headaches that are not helped by medicine. Take medicines only as told by your doctor.  You cannot use your arms or legs like normal.  You cannot walk.  You notice changes in the black spots in the center of the colored part of your eye (pupil).  You have clear or bloody fluid coming from your nose or ears.  You have trouble seeing. During the next 24 hours after the injury, you must stay with someone who can watch you. This person should get help right away (call 911 in the [place] if you start to shake and are not able to control it (have seizures), you pass out, or you are unable to wake up. HOW CAN I PREVENT A HEAD INJURY IN THE FUTURE?  Wear seat belts.  Wear a helmet while bike riding and playing sports like football.  Stay away from dangerous  activities around the house. WHEN CAN I RETURN TO NORMAL ACTIVITIES AND ATHLETICS? See your doctor before doing these activities. You should not do normal activities or play contact sports until 1 week after the following symptoms have stopped:  Headache that does not go away.  Dizziness.  Poor attention.  Confusion.  Memory problems.  Sickness to your stomach or throwing up.  Tiredness.  Fussiness.  Bothered by bright lights or loud noises.  Anxiousness or depression.  Restless sleep. MAKE SURE YOU:   Understand these instructions.  Will watch your condition.  Will get help right away if you are not doing well or get worse.   This information is not intended to replace advice given to you by your health care provider. Make sure you discuss any questions you have with your health care provider.   Document Released: 06/24/2008 Document Revised: 08/02/2014 Document Reviewed: 03/19/2013 Elsevier Interactive Patient Education [employer].

## 2015-07-20 NOTE — ED Provider Notes (Signed)
[facility] Emergency Department Provider Note  ____________________________________________  Time seen: Approximately 2:45 AM  I have reviewed the triage vital signs and the nursing notes.   HISTORY  Chief Complaint Fall and Head Laceration    HPI Cindy Estes is a 81 y.o. female who presents to the ED from home with a chief complaint of fall with forehead laceration. Patient states she tripped over her bath mat and struck her forehead on the commode. Denies LOC, vision changes, headache, neck pain. She was ambulatory after her fall. Denies recent fever, chills, chest pain, shortness of breath, abdominal pain, nausea, vomiting, diarrhea. Unknown date of tetanus shot.   Past Medical History  Diagnosis Date  . Hypertension   . Depression unk  . Anxiety unk  . Chronic back pain unk    Patient Active Problem List   Diagnosis Date Noted  . Hypertension 06/09/2015  . Chronic back pain 06/09/2015  . Scoliosis of thoracic spine 06/09/2015  . Cardiac murmur 06/09/2015  . Atypical chest pain 06/09/2015  . Chronic LBP 06/09/2015  . Anxiety and depression 06/09/2015  . Low back pain with sciatica 06/09/2015  . Urge incontinence 06/09/2015  . B12 deficiency 06/09/2015  . Anxiety 03/07/2015    Past Surgical History  Procedure Laterality Date  . Abdominal hysterectomy  1973    Current Outpatient Rx  Name  Route  Sig  Dispense  Refill  . acetaminophen-codeine (TYLENOL #3) 300-30 MG tablet      TAKE 1 TABLET EVERY 12 HOURS AS NEEDED FOR PAIN   60 tablet   2     FAX REQUEST TO FOLLOW - CONTROLLED SUBSTANCE   . ALPRAZolam (XANAX) 1 MG tablet   Oral   Take 1 tablet (1 mg total) by mouth at bedtime as needed for anxiety.   30 tablet   2   . aspirin EC 81 MG tablet   Oral   Take by mouth.         . Cholecalciferol (VITAMIN D-1000 MAX ST) 1000 UNITS tablet   Oral   Take by mouth.         . valsartan-hydrochlorothiazide (DIOVAN-HCT)  160-12.5 MG tablet      TAKE 1 TABLET EVERY DAY USUALLY IN THE MORNING   90 tablet   1   . venlafaxine XR (EFFEXOR-XR) 75 MG 24 hr capsule   Oral   Take 1 capsule (75 mg total) by mouth at bedtime.   90 capsule   1     Allergies Review of patient's allergies indicates no known allergies.  Family History  Problem Relation Age of Onset  . Dementia Father   . Heart disease Maternal Grandmother   . Diabetes Maternal Grandfather     Social History Social History  Substance Use Topics  . Smoking status: Never Smoker   . Smokeless tobacco: Not on file  . Alcohol Use: No    Review of Systems Constitutional: No fever/chills Eyes: No visual changes. ENT: Positive for for head laceration. No sore throat. Cardiovascular: Denies chest pain. Respiratory: Denies shortness of breath. Gastrointestinal: No abdominal pain.  No nausea, no vomiting.  No diarrhea.  No constipation. Genitourinary: Negative for dysuria. Musculoskeletal: Negative for back pain. Skin: Negative for rash. Neurological: Negative for headaches, focal weakness or numbness.  10-point ROS otherwise negative.  ____________________________________________   PHYSICAL EXAM:  VITAL SIGNS: ED Triage Vitals  Enc Vitals Group     BP 07/20/15 0041 178/94 mmHg  Pulse Rate 07/20/15 0041 108     Resp 07/20/15 0041 20     Temp 07/20/15 0041 98.4 F (36.9 C)     Temp Source 07/20/15 0041 Oral     SpO2 07/20/15 0041 97 %     Weight 07/20/15 0041 164 lb (74.39 kg)     Height 07/20/15 0041 4\' 10"  (1.473 m)     Head Cir --      Peak Flow --      Pain Score 07/20/15 0042 4     Pain Loc --      Pain Edu? --      Excl. in [place]? --     Constitutional: Alert and oriented. Well appearing and in no acute distress. Eyes: Conjunctivae are normal. PERRL. EOMI. Head: Approximately 6 cm jagged horizontal laceration to left forehead. No active bleeding. Nose: No congestion/rhinnorhea. Mouth/Throat: Mucous membranes are  moist.  Oropharynx non-erythematous. Neck: No stridor.  No cervical spine tenderness to palpation.  No step-offs or deformities.  No carotid bruits. Cardiovascular: Normal rate, regular rhythm. Grossly normal heart sounds.  Good peripheral circulation. Respiratory: Normal respiratory effort.  No retractions. Lungs CTAB. Gastrointestinal: Soft and nontender. No distention. No abdominal bruits. No CVA tenderness. Musculoskeletal: Pelvis stable. Full range of motion bilateral hips without pain. No lower extremity tenderness nor edema.  No joint effusions. Neurologic:  Normal speech and language. No gross focal neurologic deficits are appreciated.  Skin:  Skin is warm, dry and intact. No rash noted. Psychiatric: Mood and affect are normal. Speech and behavior are normal.  ____________________________________________   LABS (all labs ordered are listed, but only abnormal results are displayed)  Labs Reviewed - No data to display ____________________________________________  EKG  ED ECG REPORT I, Chenelle Benning J, the attending physician, personally viewed and interpreted this ECG.   Date: 07/20/2015  EKG Time: 0230  Rate: 100  Rhythm: normal EKG, normal sinus rhythm  Axis: Normal  Intervals:none  ST&T Change: Nonspecific  ____________________________________________  RADIOLOGY  CT head without contrast interpreted per Dr. Radene Knee: 1. No evidence of traumatic intracranial injury or fracture. 2. Soft tissue swelling overlying the left frontal calvarium. 3. Mild cortical volume loss and scattered small vessel ischemic microangiopathy. ____________________________________________   PROCEDURES  Procedure(s) performed:   LACERATION REPAIR Performed by: Cindy Estes Authorized by: Cindy Estes Consent: Verbal consent obtained. Risks and benefits: risks, benefits and alternatives were discussed Consent given by: patient Patient identity confirmed: provided demographic data Prepped and  Draped in normal sterile fashion Wound explored  Laceration Location: left forehead  Laceration Length: 6cm  No Foreign Bodies seen or palpated  Anesthesia: local infiltration  Local anesthetic: lidocaine 1% with epinephrine  Anesthetic total: 8 ml  Irrigation method: syringe Amount of cleaning: standard  Skin closure: 5-0 nylon   Number of sutures: continuous  Technique: standard sterile  Patient tolerance: Patient tolerated the procedure well with no immediate complications. Small amount of bleeding on suturing which was controlled with Surgicel and direct pressure.  Critical Care performed: No  ____________________________________________   INITIAL IMPRESSION / ASSESSMENT AND PLAN / ED COURSE  Pertinent labs & imaging results that were available during my care of the patient were reviewed by me and considered in my medical decision making (see chart for details).  81 year old female with forehead laceration s/p mechanical fall. No intracranial hemorrhage noted on CT head. Will update tetanus; tolerated suture repair well. Strict return precautions given. Patient and daughter verbalize understanding and agree with plan of care.  ____________________________________________   FINAL CLINICAL IMPRESSION(S) / ED DIAGNOSES  Final diagnoses:  Fall, initial encounter  Laceration  Head injury, initial encounter      Cindy Blanch, MD 07/20/15 [phone]

## 2015-07-20 NOTE — ED Notes (Addendum)
Pt reports she tripped over the bath mat and hit her head on the toilet. Pt denies pain to head and/or extremities. Pt has approx 3" laceration on left forehead, with redness/swelling to area.  Pt also has redness on interior aspect of left antecubital area. Pt denies pain to the area.  Pt reports she is not on blood thinners. Pt reports 2 other falls in the last 6 months.

## 2015-07-20 NOTE — ED Notes (Signed)
Reviewed d/c instructions, follow-up care, pain management, and when to have sutures removed with pt and pt's daughter. Pt and daughter verbalized understanding.

## 2015-07-20 NOTE — ED Notes (Signed)
Pt from home following a fall; pt tripped on the bathroom rug and hit the the center of her forehead; laceration dressed before arrival; [place] first responder had called ahead for family saying laceration will bleed freely when dressing removed; pt denies loss of consciousness; denies neck pain; pt alert and oriented x 3;

## 2015-07-22 ENCOUNTER — Ambulatory Visit (INDEPENDENT_AMBULATORY_CARE_PROVIDER_SITE_OTHER): Payer: Medicare Other | Admitting: Sports Medicine

## 2015-07-22 ENCOUNTER — Encounter: Payer: Self-pay | Admitting: Sports Medicine

## 2015-07-22 DIAGNOSIS — M79676 Pain in unspecified toe(s): Secondary | ICD-10-CM

## 2015-07-22 DIAGNOSIS — B351 Tinea unguium: Secondary | ICD-10-CM | POA: Diagnosis not present

## 2015-07-22 DIAGNOSIS — M2040 Other hammer toe(s) (acquired), unspecified foot: Secondary | ICD-10-CM

## 2015-07-22 NOTE — Progress Notes (Signed)
Patient ID: Cindy Estes, female   DOB: 04/15/1924, 81 y.o.   MRN: [id number] Subjective: Cindy Estes is a 81 y.o. female patient seen today in office with complaint of painful thickened and elongated toenails; unable to trim. Patient denies history of Diabetes, Neuropathy, or Vascular disease. Patient has no other pedal complaints at this time.   Patient Active Problem List   Diagnosis Date Noted  . Hypertension 06/09/2015  . Chronic back pain 06/09/2015  . Scoliosis of thoracic spine 06/09/2015  . Cardiac murmur 06/09/2015  . Atypical chest pain 06/09/2015  . Chronic LBP 06/09/2015  . Anxiety and depression 06/09/2015  . Low back pain with sciatica 06/09/2015  . Urge incontinence 06/09/2015  . B12 deficiency 06/09/2015  . Anxiety 03/07/2015   Current Outpatient Prescriptions on File Prior to Visit  Medication Sig Dispense Refill  . acetaminophen-codeine (TYLENOL #3) 300-30 MG tablet TAKE 1 TABLET EVERY 12 HOURS AS NEEDED FOR PAIN 60 tablet 2  . ALPRAZolam (XANAX) 1 MG tablet TAKE ONE TABLET AT BEDTIME IF NEEDED FORANXIETY 30 tablet 2  . aspirin EC 81 MG tablet Take by mouth.    . Cholecalciferol (VITAMIN D-1000 MAX ST) 1000 UNITS tablet Take by mouth.    . valsartan-hydrochlorothiazide (DIOVAN-HCT) 160-12.5 MG tablet TAKE 1 TABLET EVERY DAY USUALLY IN THE MORNING 90 tablet 1  . venlafaxine XR (EFFEXOR-XR) 75 MG 24 hr capsule Take 1 capsule (75 mg total) by mouth at bedtime. 90 capsule 1   No current facility-administered medications on file prior to visit.   No Known Allergies   Objective: Physical Exam  General: Well developed, nourished, no acute distress, awake, alert and oriented x 3 with bandage on her head s/p fall on Christmas day went to ER with stitches in place; no acute symptoms, no visual disturbance or headache.   Vascular: Dorsalis pedis artery 1/4 bilateral, Posterior tibial artery 1/4 bilateral, skin temperature warm to warm proximal to distal bilateral lower  extremities, + varicosities, No pedal hair present bilateral.  Neurological: Gross sensation present via light touch bilateral.   Dermatological: Skin is warm, dry, and supple bilateral, Nails 1-10 are tender, long, thick, and discolored with mild subungal debris, no webspace macerations present bilateral, no open lesions present bilateral, no callus/corns/hyperkeratotic tissue present bilateral. No signs of infection bilateral.  Musculoskeletal: Asymptomatic hammertoe boney deformities noted bilateral. Muscular strength within normal limits without pain or limitation on range of motion. No pain with calf compression bilateral.  Assessment and Plan:  Problem List Items Addressed This Visit    None    Visit Diagnoses    Dermatophytosis of nail    -  Primary    Hammertoe, unspecified laterality        Pain of toe, unspecified laterality          -Examined patient.  -Discussed treatment options for painful mycotic nails. -Mechanically debrided and reduced mycotic nails with sterile nail nipper and dremel nail file without incident. -Recommend good supportive shoes daily for foot type and use of rolling walker for ambulation assistance -Patient to return in 3 months for follow up evaluation or sooner if symptoms worsen.  Landis Martins, DPM

## 2015-08-05 ENCOUNTER — Other Ambulatory Visit: Payer: Self-pay | Admitting: Family Medicine

## 2015-08-05 DIAGNOSIS — F419 Anxiety disorder, unspecified: Secondary | ICD-10-CM

## 2015-08-09 ENCOUNTER — Other Ambulatory Visit: Payer: Self-pay | Admitting: Family Medicine

## 2015-08-12 ENCOUNTER — Other Ambulatory Visit: Payer: Self-pay | Admitting: Family Medicine

## 2015-08-14 ENCOUNTER — Other Ambulatory Visit: Payer: Self-pay | Admitting: Family Medicine

## 2015-08-18 ENCOUNTER — Other Ambulatory Visit: Payer: Self-pay | Admitting: Family Medicine

## 2015-08-20 NOTE — Telephone Encounter (Signed)
Prescription was refilled on 08/14/2015, patient's daughter has already gotten prescription on 08/19/2015

## 2015-09-11 ENCOUNTER — Ambulatory Visit (INDEPENDENT_AMBULATORY_CARE_PROVIDER_SITE_OTHER): Payer: Medicare Other | Admitting: Family Medicine

## 2015-09-11 ENCOUNTER — Encounter: Payer: Self-pay | Admitting: Family Medicine

## 2015-09-11 VITALS — BP 148/68 | HR 107 | Temp 97.5°F | Resp 20 | Ht <= 58 in | Wt 166.1 lb

## 2015-09-11 DIAGNOSIS — I10 Essential (primary) hypertension: Secondary | ICD-10-CM | POA: Diagnosis not present

## 2015-09-11 DIAGNOSIS — G8929 Other chronic pain: Secondary | ICD-10-CM | POA: Diagnosis not present

## 2015-09-11 DIAGNOSIS — F418 Other specified anxiety disorders: Secondary | ICD-10-CM | POA: Diagnosis not present

## 2015-09-11 DIAGNOSIS — F32A Depression, unspecified: Secondary | ICD-10-CM

## 2015-09-11 DIAGNOSIS — F329 Major depressive disorder, single episode, unspecified: Secondary | ICD-10-CM

## 2015-09-11 DIAGNOSIS — M549 Dorsalgia, unspecified: Secondary | ICD-10-CM

## 2015-09-11 DIAGNOSIS — F419 Anxiety disorder, unspecified: Secondary | ICD-10-CM

## 2015-09-11 NOTE — Progress Notes (Signed)
Name: Cindy Estes   MRN: [id number]    DOB: 26-Apr-1924   Date:09/11/2015       Progress Note  Subjective  Chief Complaint  Chief Complaint  Patient presents with  . Follow-up    3 mo  . Hypertension    Hypertension This is a chronic problem. The problem is controlled. Pertinent negatives include no blurred vision, chest pain, headaches or palpitations. Past treatments include angiotensin blockers and diuretics. There is no history of kidney disease, CAD/MI or CVA.     Past Medical History  Diagnosis Date  . Hypertension   . Depression unk  . Anxiety unk  . Chronic back pain unk    Past Surgical History  Procedure Laterality Date  . Abdominal hysterectomy  1973    Family History  Problem Relation Age of Onset  . Dementia Father   . Heart disease Maternal Grandmother   . Diabetes Maternal Grandfather     Social History   Social History  . Marital Status: Widowed    Spouse Name: N/A  . Number of Children: N/A  . Years of Education: N/A   Occupational History  . Not on file.   Social History Main Topics  . Smoking status: Never Smoker   . Smokeless tobacco: Not on file  . Alcohol Use: No  . Drug Use: Not on file  . Sexual Activity: Not on file   Other Topics Concern  . Not on file   Social History Narrative     Current outpatient prescriptions:  .  acetaminophen-codeine (TYLENOL #3) 300-30 MG tablet, TAKE 1 TABLET EVERY 12 HOURS AS NEEDED FOR PAIN, Disp: 60 tablet, Rfl: 2 .  ALPRAZolam (XANAX) 1 MG tablet, TAKE ONE TABLET AT BEDTIME IF NEEDED FORANXIETY, Disp: 30 tablet, Rfl: 2 .  aspirin EC 81 MG tablet, Take by mouth., Disp: , Rfl:  .  Cholecalciferol (VITAMIN D-1000 MAX ST) 1000 UNITS tablet, Take by mouth., Disp: , Rfl:  .  valsartan-hydrochlorothiazide (DIOVAN-HCT) 160-12.5 MG tablet, TAKE 1 TABLET EVERY DAY USUALLY IN THE MORNING, Disp: 90 tablet, Rfl: 1 .  venlafaxine XR (EFFEXOR-XR) 75 MG 24 hr capsule, TAKE ONE CAPSULE AT BEDTIME, Disp: 90  capsule, Rfl: 3  No Known Allergies   Review of Systems  Eyes: Negative for blurred vision.  Cardiovascular: Negative for chest pain and palpitations.  Neurological: Negative for headaches.    Objective  Filed Vitals:   09/11/15 1411  BP: 148/68  Pulse: 107  Temp: 97.5 F (36.4 C)  TempSrc: Oral  Resp: 20  Height: 4\' 10"  (1.473 m)  Weight: 166 lb 1.6 oz (75.342 kg)  SpO2: 96%    Physical Exam  Constitutional: She is oriented to person, place, and time and well-developed, well-nourished, and in no distress.  HENT:  Head: Normocephalic and atraumatic.  Cardiovascular: Normal rate, regular rhythm, S1 normal and S2 normal.   Murmur heard.  Systolic murmur is present with a grade of 2/6  Pulmonary/Chest: Effort normal and breath sounds normal.  Neurological: She is alert and oriented to person, place, and time.  Nursing note and vitals reviewed.    Assessment & Plan  1. Essential hypertension BP stable and controlled on present therapy  2. Anxiety and depression Symptoms stable on alprazolam and Effexor.  3. Chronic back pain Controlled on Tylenol 3 taken twice daily as needed   Kourtnie Sachs [facility] 09/11/2015 2:25 PM

## 2015-10-21 ENCOUNTER — Ambulatory Visit: Payer: Medicare Other | Admitting: Sports Medicine

## 2015-10-31 ENCOUNTER — Encounter: Payer: Self-pay | Admitting: Sports Medicine

## 2015-10-31 ENCOUNTER — Ambulatory Visit (INDEPENDENT_AMBULATORY_CARE_PROVIDER_SITE_OTHER): Payer: Medicare Other | Admitting: Sports Medicine

## 2015-10-31 DIAGNOSIS — G4700 Insomnia, unspecified: Secondary | ICD-10-CM | POA: Insufficient documentation

## 2015-10-31 DIAGNOSIS — K219 Gastro-esophageal reflux disease without esophagitis: Secondary | ICD-10-CM | POA: Insufficient documentation

## 2015-10-31 DIAGNOSIS — T07XXXA Unspecified multiple injuries, initial encounter: Secondary | ICD-10-CM | POA: Insufficient documentation

## 2015-10-31 DIAGNOSIS — J988 Other specified respiratory disorders: Secondary | ICD-10-CM | POA: Insufficient documentation

## 2015-10-31 DIAGNOSIS — M2040 Other hammer toe(s) (acquired), unspecified foot: Secondary | ICD-10-CM

## 2015-10-31 DIAGNOSIS — R0982 Postnasal drip: Secondary | ICD-10-CM | POA: Insufficient documentation

## 2015-10-31 DIAGNOSIS — B351 Tinea unguium: Secondary | ICD-10-CM | POA: Diagnosis not present

## 2015-10-31 DIAGNOSIS — Z87898 Personal history of other specified conditions: Secondary | ICD-10-CM | POA: Insufficient documentation

## 2015-10-31 DIAGNOSIS — Z23 Encounter for immunization: Secondary | ICD-10-CM | POA: Insufficient documentation

## 2015-10-31 DIAGNOSIS — R198 Other specified symptoms and signs involving the digestive system and abdomen: Secondary | ICD-10-CM | POA: Insufficient documentation

## 2015-10-31 DIAGNOSIS — L853 Xerosis cutis: Secondary | ICD-10-CM

## 2015-10-31 DIAGNOSIS — M419 Scoliosis, unspecified: Secondary | ICD-10-CM | POA: Insufficient documentation

## 2015-10-31 DIAGNOSIS — IMO0002 Reserved for concepts with insufficient information to code with codable children: Secondary | ICD-10-CM | POA: Insufficient documentation

## 2015-10-31 DIAGNOSIS — M79676 Pain in unspecified toe(s): Secondary | ICD-10-CM | POA: Diagnosis not present

## 2015-10-31 NOTE — Progress Notes (Signed)
Patient ID: Cindy Estes, female   DOB: 1923/09/01, 82 y.o.   MRN: [id number]  Subjective: Cindy Estes is a 82 y.o. female patient seen today in office with complaint of painful thickened and elongated toenails; unable to trim. Patient denies any changes with medical history since last visit. Patient has no other pedal complaints at this time.   Patient Active Problem List   Diagnosis Date Noted  . Altered bowel function 10/31/2015  . Congestion of respiratory tract 10/31/2015  . Acid reflux 10/31/2015  . Gravida 1 10/31/2015  . Flu vaccine need 10/31/2015  . Cannot sleep 10/31/2015  . Lumbar scoliosis 10/31/2015  . Multiple bruises 10/31/2015  . Fungal infection of toenail 10/31/2015  . Parity 1 10/31/2015  . PND (post-nasal drip) 10/31/2015  . Hypertension 06/09/2015  . Chronic back pain 06/09/2015  . Scoliosis of thoracic spine 06/09/2015  . Cardiac murmur 06/09/2015  . Atypical chest pain 06/09/2015  . Chronic LBP 06/09/2015  . Anxiety and depression 06/09/2015  . Low back pain with sciatica 06/09/2015  . Urge incontinence 06/09/2015  . B12 deficiency 06/09/2015  . Anxiety 03/07/2015  . Edema leg 05/16/2014  . Fall in home 08/21/2012  . History of abdominal hernia 08/08/2011  . Personal history of other specified conditions 07/22/2011  . Lymphocytic colitis 07/08/2011   Current Outpatient Prescriptions on File Prior to Visit  Medication Sig Dispense Refill  . acetaminophen-codeine (TYLENOL #3) 300-30 MG tablet TAKE 1 TABLET EVERY 12 HOURS AS NEEDED FOR PAIN 60 tablet 2  . ALPRAZolam (XANAX) 1 MG tablet TAKE ONE TABLET AT BEDTIME IF NEEDED FORANXIETY 30 tablet 2  . aspirin EC 81 MG tablet Take by mouth.    . Cholecalciferol (VITAMIN D-1000 MAX ST) 1000 UNITS tablet Take by mouth.    . valsartan-hydrochlorothiazide (DIOVAN-HCT) 160-12.5 MG tablet TAKE 1 TABLET EVERY DAY USUALLY IN THE MORNING 90 tablet 1  . venlafaxine XR (EFFEXOR-XR) 75 MG 24 hr capsule TAKE ONE  CAPSULE AT BEDTIME 90 capsule 3   No current facility-administered medications on file prior to visit.   No Known Allergies   Objective: Physical Exam  General: Well developed, nourished, no acute distress, awake, alert and oriented x 3   Vascular: Dorsalis pedis artery 1/4 bilateral, Posterior tibial artery 1/4 bilateral, skin temperature warm to warm proximal to distal bilateral lower extremities, + varicosities, No pedal hair present bilateral.  Neurological: Gross sensation present via light touch bilateral.   Dermatological: Skin is warm, significantly dry, and supple bilateral, Nails 1-10 are tender, long, thick, and discolored with mild subungal debris, no webspace macerations present bilateral, no open lesions present bilateral, no callus/corns/hyperkeratotic tissue present bilateral. No signs of infection bilateral.  Musculoskeletal: Asymptomatic hammertoe boney deformities noted bilateral. Muscular strength within normal limits without pain or limitation on range of motion. No pain with calf compression bilateral.  Assessment and Plan:  Problem List Items Addressed This Visit    None    Visit Diagnoses    Dermatophytosis of nail    -  Primary    Dry skin        Hammertoe, unspecified laterality        Pain of toe, unspecified laterality          -Examined patient.  -Discussed treatment options for painful mycotic nails. -Mechanically debrided and reduced mycotic nails with sterile nail nipper and dremel nail file without incident. -Recommend daily skin emollients; bag balm -Recommend cont with good supportive shoes daily for foot  type and use of rolling walker for ambulation assistance -Patient to return in 3 months for follow up evaluation or sooner if symptoms worsen.  Landis Martins, DPM

## 2015-11-06 ENCOUNTER — Encounter: Payer: Self-pay | Admitting: Family Medicine

## 2015-11-06 ENCOUNTER — Ambulatory Visit (INDEPENDENT_AMBULATORY_CARE_PROVIDER_SITE_OTHER): Payer: Medicare Other | Admitting: Family Medicine

## 2015-11-06 VITALS — BP 116/68 | HR 105 | Temp 98.0°F | Resp 18 | Ht <= 58 in | Wt 152.4 lb

## 2015-11-06 DIAGNOSIS — R413 Other amnesia: Secondary | ICD-10-CM | POA: Diagnosis not present

## 2015-11-06 DIAGNOSIS — F329 Major depressive disorder, single episode, unspecified: Secondary | ICD-10-CM

## 2015-11-06 DIAGNOSIS — G8929 Other chronic pain: Secondary | ICD-10-CM | POA: Diagnosis not present

## 2015-11-06 DIAGNOSIS — M549 Dorsalgia, unspecified: Secondary | ICD-10-CM

## 2015-11-06 DIAGNOSIS — F32A Depression, unspecified: Secondary | ICD-10-CM

## 2015-11-06 DIAGNOSIS — F418 Other specified anxiety disorders: Secondary | ICD-10-CM | POA: Diagnosis not present

## 2015-11-06 DIAGNOSIS — F419 Anxiety disorder, unspecified: Principal | ICD-10-CM

## 2015-11-06 DIAGNOSIS — G4700 Insomnia, unspecified: Secondary | ICD-10-CM

## 2015-11-06 MED ORDER — ACETAMINOPHEN-CODEINE #3 300-30 MG PO TABS: 1.0000 | ORAL_TABLET | Freq: Two times a day (BID) | ORAL | Status: DC | PRN

## 2015-11-06 MED ORDER — ALPRAZOLAM 1 MG PO TABS: 1.0000 mg | ORAL_TABLET | Freq: Every evening | ORAL | Status: DC | PRN

## 2015-11-06 NOTE — Progress Notes (Signed)
Name: Cindy Estes   MRN: [id number]    DOB: [birth year]   Date:11/06/2015       Progress Note  Subjective  Chief Complaint  Chief Complaint  Patient presents with  . Medication Refill    2 month  . Back Pain    Patient states her pain is stable, but worst when she walks or stands for long periods of time.   . Insomnia    Patient says medication helps her sleep, it will get interrupted from back pain sometimes   . Memory Loss    Daughter is concerned with memory loss, patient was unable to inform me of what day it was and daughters states she will walk around with dirty clothes and forget short term things.     Back Pain This is a chronic problem. The problem is unchanged. The pain is present in the lumbar spine. The symptoms are aggravated by standing (Worse with walking).  Anxiety Presents for follow-up visit. The problem has been unchanged. Symptoms include insomnia and nervous/anxious behavior. Patient reports no depressed mood.   Past treatments include SSRIs and benzodiazephines.   Memory Impairment: Daughter has noticed subtle deterioration In patient's memory, patient does not appear concerned stating " I'll be turning 34 this year''    Past Medical History  Diagnosis Date  . Hypertension   . Depression unk  . Anxiety unk  . Chronic back pain unk    Past Surgical History  Procedure Laterality Date  . Abdominal hysterectomy  1973    Family History  Problem Relation Age of Onset  . Dementia Father   . Heart disease Maternal Grandmother   . Diabetes Maternal Grandfather     Social History   Social History  . Marital Status: Widowed    Spouse Name: N/A  . Number of Children: N/A  . Years of Education: N/A   Occupational History  . Not on file.   Social History Main Topics  . Smoking status: Never Smoker   . Smokeless tobacco: Not on file  . Alcohol Use: No  . Drug Use: Not on file  . Sexual Activity: Not on file   Other Topics Concern  . Not on  file   Social History Narrative     Current outpatient prescriptions:  .  acetaminophen-codeine (TYLENOL #3) 300-30 MG tablet, TAKE 1 TABLET EVERY 12 HOURS AS NEEDED FOR PAIN, Disp: 60 tablet, Rfl: 2 .  ALPRAZolam (XANAX) 1 MG tablet, TAKE ONE TABLET AT BEDTIME IF NEEDED FORANXIETY, Disp: 30 tablet, Rfl: 2 .  aspirin EC 81 MG tablet, Take by mouth., Disp: , Rfl:  .  Cholecalciferol (VITAMIN D-1000 MAX ST) 1000 UNITS tablet, Take by mouth., Disp: , Rfl:  .  valsartan-hydrochlorothiazide (DIOVAN-HCT) 160-12.5 MG tablet, TAKE 1 TABLET EVERY DAY USUALLY IN THE MORNING, Disp: 90 tablet, Rfl: 1 .  venlafaxine XR (EFFEXOR-XR) 75 MG 24 hr capsule, TAKE ONE CAPSULE AT BEDTIME, Disp: 90 capsule, Rfl: 3  No Known Allergies   Review of Systems  Musculoskeletal: Positive for back pain.  Psychiatric/Behavioral: The patient is nervous/anxious and has insomnia.     Objective  Filed Vitals:   11/06/15 1443  BP: 116/68  Pulse: 105  Temp: 98 F (36.7 C)  TempSrc: Oral  Resp: 18  Height: 4\' 10"  (1.473 m)  Weight: 152 lb 6.4 oz (69.128 kg)  SpO2: 94%    Physical Exam  Constitutional: She is well-developed, well-nourished, and in no distress.  Cardiovascular: Normal rate and  regular rhythm.   Pulmonary/Chest: Effort normal and breath sounds normal.  Musculoskeletal:       Lumbar back: She exhibits no tenderness, no pain and no spasm.       Back:  Neurological: She is alert.  Psychiatric: Mood, memory, affect and judgment normal.  Nursing note and vitals reviewed.      Assessment & Plan  1. Anxiety and depression Stable and responsive to alprazolam. Refills provided - ALPRAZolam (XANAX) 1 MG tablet; Take 1 tablet (1 mg total) by mouth at bedtime as needed for anxiety.  Dispense: 30 tablet; Refill: 2  2. Cannot sleep Taking Alprazolam for insomnia, which has worked, no reported side effects.  3. Chronic back pain Stable and responsive to Tylenol 3 taken twice daily as needed.  We will obtain a urine drug screen as part of controlled substances agreement. - acetaminophen-codeine (TYLENOL #3) 300-30 MG tablet; Take 1 tablet by mouth every 12 (twelve) hours as needed for moderate pain.  Dispense: 60 tablet; Refill: 2 - Drug Screen 12+Alcohol+CRT, Ur  4. Memory difficulties Patient will need and an MMSE to objectively evaluate and categorize her possible memory impairment. Patient and her daughter both think about this and will let me know at her next appointment.   Sanav Remer [facility] 11/06/2015 2:49 PM

## 2015-11-07 DIAGNOSIS — M549 Dorsalgia, unspecified: Secondary | ICD-10-CM | POA: Diagnosis not present

## 2015-11-07 DIAGNOSIS — G8929 Other chronic pain: Secondary | ICD-10-CM | POA: Diagnosis not present

## 2015-11-25 LAB — DRUG SCREEN 12+ALCOHOL+CRT, UR
Amphetamines, Urine: NEGATIVE ng/mL
BENZODIAZ UR QL: NEGATIVE ng/mL
Barbiturate: NEGATIVE ng/mL
COCAINE (METABOLITE): NEGATIVE ng/mL
Cannabinoids: NEGATIVE ng/mL
Creatinine, Urine: 128.8 mg/dL (ref 20.0–300.0)
ETHANOL U, QUAN: NEGATIVE %
MEPERIDINE: NEGATIVE ng/mL
Methadone: NEGATIVE ng/mL
OPIATE SCREEN URINE: POSITIVE ng/mL — AB
OXYCODONE+OXYMORPHONE UR QL SCN: NEGATIVE ng/mL
PHENCYCLIDINE: NEGATIVE ng/mL
PROPOXYPHENE: NEGATIVE ng/mL
Tramadol: NEGATIVE ng/mL

## 2015-11-26 ENCOUNTER — Encounter: Payer: Self-pay | Admitting: Emergency Medicine

## 2015-11-26 ENCOUNTER — Emergency Department
Admission: EM | Admit: 2015-11-26 | Discharge: 2015-11-26 | Disposition: A | Discharge status: Home / Self Care | Payer: Medicare Other | Attending: Emergency Medicine | Admitting: Emergency Medicine

## 2015-11-26 ENCOUNTER — Emergency Department: Payer: Medicare Other

## 2015-11-26 DIAGNOSIS — Z7982 Long term (current) use of aspirin: Secondary | ICD-10-CM | POA: Diagnosis not present

## 2015-11-26 DIAGNOSIS — I249 Acute ischemic heart disease, unspecified: Secondary | ICD-10-CM | POA: Insufficient documentation

## 2015-11-26 DIAGNOSIS — F329 Major depressive disorder, single episode, unspecified: Secondary | ICD-10-CM | POA: Diagnosis not present

## 2015-11-26 DIAGNOSIS — I10 Essential (primary) hypertension: Secondary | ICD-10-CM | POA: Insufficient documentation

## 2015-11-26 DIAGNOSIS — R0789 Other chest pain: Secondary | ICD-10-CM | POA: Diagnosis not present

## 2015-11-26 DIAGNOSIS — R079 Chest pain, unspecified: Secondary | ICD-10-CM | POA: Diagnosis present

## 2015-11-26 LAB — TROPONIN I
Troponin I: <0.03 ng/mL (ref <0.031)
Troponin I: <0.03 ng/mL (ref <0.031)

## 2015-11-26 LAB — COMPREHENSIVE METABOLIC PANEL
ALBUMIN: 3.8 g/dL (ref 3.5–5.0)
ALK PHOS: 55 U/L (ref 38–126)
ALT: 13 U/L — AB (ref 14–54)
AST: 20 U/L (ref 15–41)
Anion gap: 11 (ref 5–15)
BUN: 26 mg/dL — ABNORMAL HIGH (ref 6–20)
CALCIUM: 9.3 mg/dL (ref 8.9–10.3)
CHLORIDE: 100 mmol/L — AB (ref 101–111)
CO2: 28 mmol/L (ref 22–32)
CREATININE: 0.8 mg/dL (ref 0.44–1.00)
GFR calc Af Amer: >60 mL/min (ref >60)
GFR calc non Af Amer: >60 mL/min (ref >60)
GLUCOSE: 96 mg/dL (ref 65–99)
Potassium: 3.9 mmol/L (ref 3.5–5.1)
SODIUM: 139 mmol/L (ref 135–145)
Total Bilirubin: 1 mg/dL (ref 0.3–1.2)
Total Protein: 7.2 g/dL (ref 6.5–8.1)

## 2015-11-26 LAB — CBC
HEMATOCRIT: 41.1 % (ref 35.0–47.0)
HEMOGLOBIN: 14 g/dL (ref 12.0–16.0)
MCH: 34 pg (ref 26.0–34.0)
MCHC: 34 g/dL (ref 32.0–36.0)
MCV: 99.8 fL (ref 80.0–100.0)
Platelets: 249 10*3/uL (ref 150–440)
RBC: 4.11 MIL/uL (ref 3.80–5.20)
RDW: 13.2 % (ref 11.5–14.5)
WBC: 7.9 10*3/uL (ref 3.6–11.0)

## 2015-11-26 LAB — BRAIN NATRIURETIC PEPTIDE: B Natriuretic Peptide: 25 pg/mL (ref 0.0–100.0)

## 2015-11-26 MED ORDER — ASPIRIN 81 MG PO CHEW
324.0000 mg | CHEWABLE_TABLET | Freq: Once | ORAL | Status: AC
  Administered 2015-11-26: 324 mg via ORAL
  Filled 2015-11-26: qty 4

## 2015-11-26 MED ORDER — ONDANSETRON HCL 4 MG/2ML IJ SOLN
INTRAMUSCULAR | Status: AC
  Filled 2015-11-26: qty 2

## 2015-11-26 NOTE — Discharge Instructions (Signed)
You have been seen in the Emergency Department (ED) today for chest pain.  As we have discussed today’s test results are normal, but you may require further testing. ° °Please follow up with the recommended doctor as instructed above in these documents regarding today’s emergent visit and your recent symptoms to discuss further management.  Continue to take your regular medications. If you are not doing so already, please also take a daily baby aspirin (81 mg), at least until you follow up with your doctor. ° °Return to the Emergency Department (ED) if you experience any further chest pain/pressure/tightness, difficulty breathing, or sudden sweating, or other symptoms that concern you. ° ° °Chest Pain Observation °It is often hard to give a specific diagnosis for the cause of chest pain. Among other possibilities your symptoms might be caused by inadequate oxygen delivery to your heart (angina). Angina that is not treated or evaluated can lead to a heart attack (myocardial infarction) or death. °Blood tests, electrocardiograms, and X-rays may have been done to help determine a possible cause of your chest pain. After evaluation and observation, your health care provider has determined that it is unlikely your pain was caused by an unstable condition that requires hospitalization. However, a full evaluation of your pain may need to be completed, with additional diagnostic testing as directed. It is very important to keep your follow-up appointments. Not keeping your follow-up appointments could result in permanent heart damage, disability, or death. If there is any problem keeping your follow-up appointments, you must call your health care provider. °HOME CARE INSTRUCTIONS  °Due to the slight chance that your pain could be angina, it is important to follow your health care provider's treatment plan and also maintain a healthy lifestyle: °· Maintain or work toward achieving a healthy weight. °· Stay physically active  and exercise regularly. °· Decrease your salt intake. °· Eat a balanced, healthy diet. Talk to a dietitian to learn about heart-healthy foods. °· Increase your fiber intake by including whole grains, vegetables, fruits, and nuts in your diet. °· Avoid situations that cause stress, anger, or depression. °· Take medicines as advised by your health care provider. Report any side effects to your health care provider. Do not stop medicines or adjust the dosages on your own. °· Quit smoking. Do not use nicotine patches or gum until you check with your health care provider. °· Keep your blood pressure, blood sugar, and cholesterol levels within normal limits. °· Limit alcohol intake to no more than 1 drink per day for women who are not pregnant and 2 drinks per day for men. °· Do not abuse drugs. °SEEK IMMEDIATE MEDICAL CARE IF: °You have severe chest pain or pressure which may include symptoms such as: °· You feel pain or pressure in your arms, neck, jaw, or back. °· You have severe back or abdominal pain, feel sick to your stomach (nauseous), or throw up (vomit). °· You are sweating profusely. °· You are having a fast or irregular heartbeat. °· You feel short of breath while at rest. °· You notice increasing shortness of breath during rest, sleep, or with activity. °· You have chest pain that does not get better after rest or after taking your usual medicine. °· You wake from sleep with chest pain. °· You are unable to sleep because you cannot breathe. °· You develop a frequent cough or you are coughing up blood. °· You feel dizzy, faint, or experience extreme fatigue. °· You develop severe weakness, dizziness, fainting,   or chills. °Any of these symptoms may represent a serious problem that is an emergency. Do not wait to see if the symptoms will go away. Call your local emergency services (911 in the [place] Do not drive yourself to the hospital. °MAKE SURE YOU: °· Understand these instructions. °· Will watch your  condition. °· Will get help right away if you are not doing well or get worse. °  °This information is not intended to replace advice given to you by your health care provider. Make sure you discuss any questions you have with your health care provider. °  °Document Released: 08/14/2010 Document Revised: 07/17/2013 Document Reviewed: 01/11/2013 °Elsevier Interactive Patient Education ©2016 [employer] ° °

## 2015-11-26 NOTE — ED Notes (Addendum)
Pt presents to ED via EMS with c/o chest pain that woke her up from sleep. Redness noted at arms and around the nose. Pt denies chest pain now and no other symptoms. Alerts and oriented x4 at this time.

## 2015-11-26 NOTE — ED Provider Notes (Signed)
[facility] Emergency Department Provider Note  ____________________________________________  Time seen: Approximately 7:09 AM  I have reviewed the triage vital signs and the nursing notes.   HISTORY  Chief Complaint Chest Pain    HPI Cindy Estes is a 82 y.o. female previous history of hypertension, depression and chronic back pain.  Patient reports that she woke up about an hour ago and she has a difficult to describe feeling of pain in the left upper chest. It lasted for maybe 5 minutes, and she states it "scared her." She reports it went away on its own without doing anything.  She does not have a history of any chest pain with walking, prior history of heart disease. Denies any nausea, vomiting, radiation, or pain moving to the back. There is no ripping or tearing feeling. No numbness or weakness. She reports she feels absolutely fine right now. The pain lasted no more and 5 minutes, was not sharp, denies it was pressure, states his discomfort of hard to describe.  She did not taking medicine prior to coming emergency room stay.   Past Medical History  Diagnosis Date  . Hypertension   . Depression unk  . Anxiety unk  . Chronic back pain unk    Patient Active Problem List   Diagnosis Date Noted  . Memory difficulties 11/06/2015  . Altered bowel function 10/31/2015  . Congestion of respiratory tract 10/31/2015  . Acid reflux 10/31/2015  . Gravida 1 10/31/2015  . Flu vaccine need 10/31/2015  . Cannot sleep 10/31/2015  . Lumbar scoliosis 10/31/2015  . Multiple bruises 10/31/2015  . Fungal infection of toenail 10/31/2015  . Parity 1 10/31/2015  . PND (post-nasal drip) 10/31/2015  . Hypertension 06/09/2015  . Chronic back pain 06/09/2015  . Scoliosis of thoracic spine 06/09/2015  . Cardiac murmur 06/09/2015  . Atypical chest pain 06/09/2015  . Chronic LBP 06/09/2015  . Anxiety and depression 06/09/2015  . Low back pain with sciatica  06/09/2015  . Urge incontinence 06/09/2015  . B12 deficiency 06/09/2015  . Anxiety 03/07/2015  . Edema leg 05/16/2014  . Fall in home 08/21/2012  . History of abdominal hernia 08/08/2011  . Personal history of other specified conditions 07/22/2011  . Lymphocytic colitis 07/08/2011    Past Surgical History  Procedure Laterality Date  . Abdominal hysterectomy  1973    Current Outpatient Rx  Name  Route  Sig  Dispense  Refill  . acetaminophen-codeine (TYLENOL #3) 300-30 MG tablet   Oral   Take 1 tablet by mouth every 12 (twelve) hours as needed for moderate pain.   60 tablet   2   . ALPRAZolam (XANAX) 1 MG tablet   Oral   Take 1 tablet (1 mg total) by mouth at bedtime as needed for anxiety.   30 tablet   2   . aspirin EC 81 MG tablet   Oral   Take by mouth.         . calcium carbonate (TUMS - DOSED IN MG ELEMENTAL CALCIUM) 500 MG chewable tablet   Oral   Chew 1 tablet by mouth daily.         . cholecalciferol (VITAMIN D) 1000 units tablet   Oral   Take 1,000 Units by mouth daily.         . valsartan-hydrochlorothiazide (DIOVAN-HCT) 160-12.5 MG tablet   Oral   Take 1 tablet by mouth every morning.         . venlafaxine XR (EFFEXOR-XR)  75 MG 24 hr capsule   Oral   Take 75 mg by mouth at bedtime.           Allergies Review of patient's allergies indicates no known allergies.  Family History  Problem Relation Age of Onset  . Dementia Father   . Heart disease Maternal Grandmother   . Diabetes Maternal Grandfather     Social History Social History  Substance Use Topics  . Smoking status: Never Smoker   . Smokeless tobacco: None  . Alcohol Use: No    Review of Systems Constitutional: No fever/chills Eyes: No visual changes. ENT: No sore throat. Cardiovascular: See history of present illness. Respiratory: Denies shortness of breath. Gastrointestinal: No abdominal pain.  No nausea, no vomiting.  No diarrhea.  No constipation. Genitourinary:  Negative for dysuria. Musculoskeletal: Negative for back pain. Skin: Negative for rash. Patient's arms do look slightly red, she reports that that is normal for her. Neurological: Negative for headaches, focal weakness or numbness.  10-point ROS otherwise negative.  ____________________________________________   PHYSICAL EXAM:  VITAL SIGNS: ED Triage Vitals  Enc Vitals Group     BP 11/26/15 0647 151/76 mmHg     Pulse Rate 11/26/15 0647 78     Resp 11/26/15 0647 21     Temp 11/26/15 0647 97.6 F (36.4 C)     Temp Source 11/26/15 0647 Oral     SpO2 11/26/15 0647 94 %     Weight 11/26/15 0644 165 lb (74.844 kg)     Height 11/26/15 0644 4\' 10"  (1.473 m)     Head Cir --      Peak Flow --      Pain Score 11/26/15 0639 0     Pain Loc --      Pain Edu? --      Excl. in [place]? --    Constitutional: Alert and oriented. Well appearing and in no acute distress.Very pleasant. Well oriented. Normal alertness and speech. Eyes: Conjunctivae are normal. PERRL. EOMI. Head: Atraumatic. Nose: No congestion/rhinnorhea. Mouth/Throat: Mucous membranes are moist.  Oropharynx non-erythematous. Neck: No stridor.   Cardiovascular: Normal rate, regular rhythm. Grossly normal heart sounds except for a very faint systolic ejection murmur, patient reports she does have a history of a heart murmur.  Good peripheral circulation. Respiratory: Normal respiratory effort.  No retractions. Lungs CTAB. Gastrointestinal: Soft and nontender. No distention. No abdominal bruits. No CVA tenderness. Musculoskeletal: No lower extremity tenderness nor edema.  No joint effusions. Neurologic:  Normal speech and language. No gross focal neurologic deficits are appreciated. No gait instability. Skin:  Skin is warm, dry and intact. No rash noted other is just very mild erythema noted across the chest and upper arms, patient states this is normal. Psychiatric: Mood and affect are normal. Speech and behavior are normal. She is  calm and appropriate.  ____________________________________________   LABS (all labs ordered are listed, but only abnormal results are displayed)  Labs Reviewed  COMPREHENSIVE METABOLIC PANEL - Abnormal; Notable for the following:    Chloride 100 (*)    BUN 26 (*)    ALT 13 (*)    All other components within normal limits  CBC  TROPONIN I  BRAIN NATRIURETIC PEPTIDE  TROPONIN I   ____________________________________________  EKG  ED ECG REPORT I, Cruz Bong, the attending physician, personally viewed and interpreted this ECG.  Date: 11/26/2015 EKG Time: 645 Rate: 80 Rhythm: normal sinus rhythm QRS Axis: normal Intervals: normal ST/T Wave abnormalities: normal Conduction Disturbances:  none Narrative Interpretation: unremarkable  ____________________________________________  RADIOLOGY  DG Chest 2 View (Final result) Result time: 11/26/15 08:34:56   Final result by Rad Results In Interface (11/26/15 08:34:56)   Narrative:   CLINICAL DATA: Chest pain  EXAM: CHEST 2 VIEW  COMPARISON: None  FINDINGS: Heart size is normal. There is aortic atherosclerosis noted. No pleural effusion. Scar versus atelectasis noted in the left base. There is a marked scoliosis deformity. Advanced osteoarthritis is noted involving the glenohumeral joint on the right.  IMPRESSION: 1. Left base atelectasis versus scar. 2. Aortic atherosclerosis   Electronically Signed By: Kerby Moors M.D. On: 11/26/2015 08:34       ____________________________________________   PROCEDURES  Procedure(s) performed: None  Critical Care performed: No  ____________________________________________   INITIAL IMPRESSION / ASSESSMENT AND PLAN / ED COURSE  Pertinent labs & imaging results that were available during my care of the patient were reviewed by me and considered in my medical decision making (see chart for details).    Patient presents for evaluation of chest  discomfort that woke her from sleep. It resolved on its own and lasted less than 5 minutes. Seems it is quite atypical, did not radiate, did not described as a pressure, and went away. Her EKG here is very reassuring. She has no significant cardiac risk factors except for history of hypertension, age.  She denies any symptoms such as a ripping or tearing, shortness of breath, radiating pain, abdominal pain, or other concerns. I think the fact that her symptoms have completely resolved now, her EKG shows no ischemic abnormality, she bears no major cardiac risk factors to be reassuring. She has not a history of any exertional chest pain.  Plan to observe her in the emergency room, check first troponin the second troponin 3 hours.  Discussed with Dr. Ubaldo Glassing with cardiology who advises discharge after discussing EKG, presentation, and troponins. The patient does follow with their clinic, and they will set up close follow-up.  Discussed with the patient's daughter, as well as the patient were both agreeable with the plan for discharge. She remained symptom-free throughout her ER stay. No distress. No recurrent chest pain. She is felt to be low risk even though her age is quite advanced, by heart score she is still low wrist with only age, atypical symptoms, and a history of hypertension.  Return precautions and treatment recommendations and follow-up discussed with the patient who is agreeable with the plan.  ____________________________________________   FINAL CLINICAL IMPRESSION(S) / ED DIAGNOSES  Final diagnoses:  Chest pain with low risk of acute coronary syndrome      Delman Kitten, MD 11/26/15 1221

## 2015-12-09 ENCOUNTER — Encounter: Payer: Self-pay | Admitting: Family Medicine

## 2015-12-09 ENCOUNTER — Ambulatory Visit (INDEPENDENT_AMBULATORY_CARE_PROVIDER_SITE_OTHER): Payer: Medicare Other | Admitting: Family Medicine

## 2015-12-09 VITALS — BP 124/70 | HR 103 | Temp 98.1°F | Resp 16 | Ht <= 58 in | Wt 160.3 lb

## 2015-12-09 DIAGNOSIS — Z0000 Encounter for general adult medical examination without abnormal findings: Secondary | ICD-10-CM

## 2015-12-09 NOTE — Progress Notes (Signed)
Name: Cindy Estes   MRN: [id number]    DOB: 07/13/1924   Date:12/09/2015       Progress Note  Subjective  Chief Complaint  Chief Complaint  Patient presents with  . Annual Exam    HPI  Pt. Is here for an [employer]. For details on that Medicare annual wellness visit, please refer to the template which was completed at today's office visit.  Past Medical History  Diagnosis Date  . Hypertension   . Depression unk  . Anxiety unk  . Chronic back pain unk    Past Surgical History  Procedure Laterality Date  . Abdominal hysterectomy  1973    Family History  Problem Relation Age of Onset  . Dementia Father   . Heart disease Maternal Grandmother   . Diabetes Maternal Grandfather     Social History   Social History  . Marital Status: Widowed    Spouse Name: N/A  . Number of Children: N/A  . Years of Education: N/A   Occupational History  . Not on file.   Social History Main Topics  . Smoking status: Never Smoker   . Smokeless tobacco: Not on file  . Alcohol Use: No  . Drug Use: Not on file  . Sexual Activity: Not on file   Other Topics Concern  . Not on file   Social History Narrative     Current outpatient prescriptions:  .  acetaminophen-codeine (TYLENOL #3) 300-30 MG tablet, Take 1 tablet by mouth every 12 (twelve) hours as needed for moderate pain., Disp: 60 tablet, Rfl: 2 .  ALPRAZolam (XANAX) 1 MG tablet, Take 1 tablet (1 mg total) by mouth at bedtime as needed for anxiety., Disp: 30 tablet, Rfl: 2 .  aspirin EC 81 MG tablet, Take by mouth., Disp: , Rfl:  .  calcium carbonate (TUMS - DOSED IN MG ELEMENTAL CALCIUM) 500 MG chewable tablet, Chew 1 tablet by mouth daily., Disp: , Rfl:  .  cholecalciferol (VITAMIN D) 1000 units tablet, Take 1,000 Units by mouth daily., Disp: , Rfl:  .  valsartan-hydrochlorothiazide (DIOVAN-HCT) 160-12.5 MG tablet, Take 1 tablet by mouth every morning., Disp: , Rfl:  .  venlafaxine XR (EFFEXOR-XR) 75 MG 24 hr  capsule, Take 75 mg by mouth at bedtime., Disp: , Rfl:   No Known Allergies   ROS   Objective  Filed Vitals:   12/09/15 1407  BP: 124/70  Pulse: 103  Temp: 98.1 F (36.7 C)  TempSrc: Oral  Resp: 16  Height: 4\' 10"  (1.473 m)  Weight: 160 lb 4.8 oz (72.712 kg)  SpO2: 95%    Physical Exam  Constitutional: She is oriented to person, place, and time and well-developed, well-nourished, and in no distress.  HENT:  Head: Normocephalic and atraumatic.  Mouth/Throat: Posterior oropharyngeal erythema present.  Right ear canal occluded with cerumen Left ear canal erythematous, no cerumen  Eyes: Pupils are equal, round, and reactive to light.  Cardiovascular: Normal rate, regular rhythm, S1 normal and S2 normal.   Murmur heard.  Systolic murmur is present with a grade of 1/6  Pulmonary/Chest: Breath sounds normal.  Abdominal: Bowel sounds are normal. There is no tenderness.  Musculoskeletal:       Thoracic back: She exhibits no tenderness.       Lumbar back: She exhibits no tenderness.  Neurological: She is alert and oriented to person, place, and time.  Skin: Rash noted. Rash is macular.     Macula erythematous rash over the  right and left arms  Psychiatric: Mood, memory, affect and judgment normal.  Nursing note and vitals reviewed.    Assessment & Plan  1. Medicare annual wellness visit, initial Completed, template to be scanned in her chart.   Latika Kronick [facility] 12/09/2015 2:44 PM

## 2015-12-23 ENCOUNTER — Telehealth: Payer: Self-pay | Admitting: Family Medicine

## 2015-12-23 NOTE — Telephone Encounter (Signed)
Pt needs refill on Losartan. War.

## 2015-12-24 ENCOUNTER — Other Ambulatory Visit: Payer: Self-pay | Admitting: Family Medicine

## 2015-12-26 NOTE — Telephone Encounter (Signed)
Medication has been refilled and sent to [facility] on 12/24/2015

## 2016-01-29 ENCOUNTER — Ambulatory Visit (INDEPENDENT_AMBULATORY_CARE_PROVIDER_SITE_OTHER): Payer: Medicare Other | Admitting: Family Medicine

## 2016-01-29 ENCOUNTER — Encounter (INDEPENDENT_AMBULATORY_CARE_PROVIDER_SITE_OTHER): Payer: Self-pay

## 2016-01-29 ENCOUNTER — Encounter: Payer: Self-pay | Admitting: Family Medicine

## 2016-01-29 VITALS — BP 122/73 | HR 96 | Temp 97.9°F | Resp 16 | Ht <= 58 in | Wt 160.5 lb

## 2016-01-29 DIAGNOSIS — F418 Other specified anxiety disorders: Secondary | ICD-10-CM | POA: Diagnosis not present

## 2016-01-29 DIAGNOSIS — F99 Mental disorder, not otherwise specified: Secondary | ICD-10-CM

## 2016-01-29 DIAGNOSIS — G8929 Other chronic pain: Secondary | ICD-10-CM | POA: Diagnosis not present

## 2016-01-29 DIAGNOSIS — F419 Anxiety disorder, unspecified: Principal | ICD-10-CM

## 2016-01-29 DIAGNOSIS — R413 Other amnesia: Secondary | ICD-10-CM | POA: Diagnosis not present

## 2016-01-29 DIAGNOSIS — M549 Dorsalgia, unspecified: Secondary | ICD-10-CM

## 2016-01-29 DIAGNOSIS — F329 Major depressive disorder, single episode, unspecified: Secondary | ICD-10-CM

## 2016-01-29 MED ORDER — ACETAMINOPHEN-CODEINE #3 300-30 MG PO TABS: 1.0000 | ORAL_TABLET | Freq: Two times a day (BID) | ORAL | Status: DC | PRN

## 2016-01-29 MED ORDER — VENLAFAXINE HCL ER 75 MG PO CP24: 75.0000 mg | ORAL_CAPSULE | Freq: Every day | ORAL | Status: DC

## 2016-01-29 MED ORDER — ALPRAZOLAM 1 MG PO TABS: 1.0000 mg | ORAL_TABLET | Freq: Every evening | ORAL | Status: DC | PRN

## 2016-01-29 NOTE — Progress Notes (Signed)
Name: Cindy Estes   MRN: [id number]    DOB: September 25, 1923   Date:01/29/2016       Progress Note  Subjective  Chief Complaint  Chief Complaint  Patient presents with  . Follow-up    3 mo / memory test  . Medication Refill    venlafaxine 75 mg / xanax 1 mg / tylenol #3    Back Pain This is a chronic problem. The problem is unchanged. The pain is present in the lumbar spine and thoracic spine. The pain is at a severity of 8/10. The symptoms are aggravated by standing (Worse with walking).  Anxiety Presents for follow-up visit. The problem has been unchanged. Symptoms include insomnia and nervous/anxious behavior. Patient reports no depressed mood. The severity of symptoms is moderate.   Past treatments include SSRIs and benzodiazephines. Compliance with prior treatments has been good.  Depression        This is a chronic problem.The problem is unchanged.  Associated symptoms include insomnia.  Associated symptoms include no helplessness, no hopelessness, no decreased interest and not sad.  Past treatments include SNRIs - Serotonin and norepinephrine reuptake inhibitors.  Past medical history includes anxiety.    Memory Impairment: Daughter reports her mother's memory is gradually declining, more so with recent short-term memory. Sometimes she forgets and repeats things over and over during a conversation and sometimes forgets recent events that have taken place. No behavioral disturbance.Patient resides in assisted living facility   Past Medical History  Diagnosis Date  . Hypertension   . Depression unk  . Anxiety unk  . Chronic back pain unk    Past Surgical History  Procedure Laterality Date  . Abdominal hysterectomy  1973    Family History  Problem Relation Age of Onset  . Dementia Father   . Heart disease Maternal Grandmother   . Diabetes Maternal Grandfather     Social History   Social History  . Marital Status: Widowed    Spouse Name: N/A  . Number of Children: N/A   . Years of Education: N/A   Occupational History  . Not on file.   Social History Main Topics  . Smoking status: Never Smoker   . Smokeless tobacco: Not on file  . Alcohol Use: No  . Drug Use: Not on file  . Sexual Activity: Not on file   Other Topics Concern  . Not on file   Social History Narrative     Current outpatient prescriptions:  .  acetaminophen-codeine (TYLENOL #3) 300-30 MG tablet, Take 1 tablet by mouth every 12 (twelve) hours as needed for moderate pain., Disp: 60 tablet, Rfl: 2 .  ALPRAZolam (XANAX) 1 MG tablet, Take 1 tablet (1 mg total) by mouth at bedtime as needed for anxiety., Disp: 30 tablet, Rfl: 2 .  aspirin EC 81 MG tablet, Take by mouth., Disp: , Rfl:  .  calcium carbonate (TUMS - DOSED IN MG ELEMENTAL CALCIUM) 500 MG chewable tablet, Chew 1 tablet by mouth daily., Disp: , Rfl:  .  cholecalciferol (VITAMIN D) 1000 units tablet, Take 1,000 Units by mouth daily., Disp: , Rfl:  .  valsartan-hydrochlorothiazide (DIOVAN-HCT) 160-12.5 MG tablet, TAKE ONE TABLET EVERY MORNING, Disp: 90 tablet, Rfl: 1 .  venlafaxine XR (EFFEXOR-XR) 75 MG 24 hr capsule, Take 75 mg by mouth at bedtime., Disp: , Rfl:   No Known Allergies   Review of Systems  Musculoskeletal: Positive for back pain.  Psychiatric/Behavioral: Positive for depression. The patient is nervous/anxious and has insomnia.  Objective  Filed Vitals:   01/29/16 1050  BP: 122/73  Pulse: 96  Temp: 97.9 F (36.6 C)  TempSrc: Oral  Resp: 16  Height: 4\' 10"  (1.473 m)  Weight: 160 lb 8 oz (72.802 kg)  SpO2: 96%    Physical Exam  Constitutional: She is oriented to person, place, and time and well-developed, well-nourished, and in no distress.  HENT:  Head: Normocephalic and atraumatic.  Cardiovascular: Normal rate, regular rhythm and normal heart sounds.   No murmur heard. Pulmonary/Chest: Effort normal and breath sounds normal. She has no wheezes.  Musculoskeletal:       Lumbar back: She  exhibits no tenderness, no pain and no spasm.  Neurological: She is alert and oriented to person, place, and time.  Psychiatric: Mood, memory, affect and judgment normal.  Nursing note and vitals reviewed.     Assessment & Plan  1. Anxiety and depression Stable, responsive to alprazolam and venlafaxine - ALPRAZolam (XANAX) 1 MG tablet; Take 1 tablet (1 mg total) by mouth at bedtime as needed for anxiety.  Dispense: 30 tablet; Refill: 2 - venlafaxine XR (EFFEXOR-XR) 75 MG 24 hr capsule; Take 1 capsule (75 mg total) by mouth at bedtime.  Dispense: 90 capsule; Refill: 0  2. Chronic back pain Stable on Tylenol No. 3 taken every 12 hours as needed - acetaminophen-codeine (TYLENOL #3) 300-30 MG tablet; Take 1 tablet by mouth every 12 (twelve) hours as needed for moderate pain.  Dispense: 60 tablet; Refill: 2  3. Memory impairment   4. Abnormal MMSE Patient scored a 25 out of 30 for the Mini-Mental Status exam administered in our office today. Given her age, I believe this is not too concerning. This will serve as baseline for future evaluations.  Note: Total face-to-face time: Approximately 45 minutes. Greater than 50% spent in counseling and coordination of care. His included explaining and administering the MMSE, calculating the total score and explaining the significance of her score.   Dejanira Pamintuan [facility] 01/29/2016 11:03 AM

## 2016-01-30 ENCOUNTER — Encounter: Payer: Self-pay | Admitting: Sports Medicine

## 2016-01-30 ENCOUNTER — Ambulatory Visit (INDEPENDENT_AMBULATORY_CARE_PROVIDER_SITE_OTHER): Payer: Medicare Other | Admitting: Sports Medicine

## 2016-01-30 DIAGNOSIS — I739 Peripheral vascular disease, unspecified: Secondary | ICD-10-CM

## 2016-01-30 DIAGNOSIS — B351 Tinea unguium: Secondary | ICD-10-CM | POA: Diagnosis not present

## 2016-01-30 DIAGNOSIS — L853 Xerosis cutis: Secondary | ICD-10-CM

## 2016-01-30 DIAGNOSIS — M2040 Other hammer toe(s) (acquired), unspecified foot: Secondary | ICD-10-CM

## 2016-01-30 DIAGNOSIS — M79676 Pain in unspecified toe(s): Secondary | ICD-10-CM | POA: Diagnosis not present

## 2016-01-30 NOTE — Progress Notes (Signed)
Patient ID: Cindy Estes, female   DOB: Nov 27, 1923, 82 y.o.   MRN: [id number]  Subjective: Cindy Estes is a 82 y.o. female patient seen today in office with complaint of painful thickened and elongated toenails; unable to trim. Patient denies any changes with medical history since last visit. Patient has no other pedal complaints at this time.   Patient is assisted by daughter at this visit.  Patient Active Problem List   Diagnosis Date Noted  . Abnormal MMSE 01/29/2016  . Medicare annual wellness visit, initial 12/09/2015  . Memory difficulties 11/06/2015  . Altered bowel function 10/31/2015  . Congestion of respiratory tract 10/31/2015  . Acid reflux 10/31/2015  . Gravida 1 10/31/2015  . Flu vaccine need 10/31/2015  . Cannot sleep 10/31/2015  . Lumbar scoliosis 10/31/2015  . Multiple bruises 10/31/2015  . Fungal infection of toenail 10/31/2015  . Parity 1 10/31/2015  . PND (post-nasal drip) 10/31/2015  . Hypertension 06/09/2015  . Chronic back pain 06/09/2015  . Scoliosis of thoracic spine 06/09/2015  . Cardiac murmur 06/09/2015  . Atypical chest pain 06/09/2015  . Chronic LBP 06/09/2015  . Anxiety and depression 06/09/2015  . Low back pain with sciatica 06/09/2015  . Urge incontinence 06/09/2015  . B12 deficiency 06/09/2015  . Anxiety 03/07/2015  . Edema leg 05/16/2014  . Fall in home 08/21/2012  . History of abdominal hernia 08/08/2011  . Personal history of other specified conditions 07/22/2011  . Lymphocytic colitis 07/08/2011   Current Outpatient Prescriptions on File Prior to Visit  Medication Sig Dispense Refill  . acetaminophen-codeine (TYLENOL #3) 300-30 MG tablet Take 1 tablet by mouth every 12 (twelve) hours as needed for moderate pain. 60 tablet 2  . ALPRAZolam (XANAX) 1 MG tablet Take 1 tablet (1 mg total) by mouth at bedtime as needed for anxiety. 30 tablet 2  . aspirin EC 81 MG tablet Take by mouth.    . calcium carbonate (TUMS - DOSED IN MG ELEMENTAL  CALCIUM) 500 MG chewable tablet Chew 1 tablet by mouth daily.    . cholecalciferol (VITAMIN D) 1000 units tablet Take 1,000 Units by mouth daily.    . valsartan-hydrochlorothiazide (DIOVAN-HCT) 160-12.5 MG tablet TAKE ONE TABLET EVERY MORNING 90 tablet 1  . venlafaxine XR (EFFEXOR-XR) 75 MG 24 hr capsule Take 1 capsule (75 mg total) by mouth at bedtime. 90 capsule 0   No current facility-administered medications on file prior to visit.   No Known Allergies   Objective: Physical Exam  General: Well developed, nourished, no acute distress, awake, alert and oriented x 3   Vascular: Dorsalis pedis artery 1/4 bilateral, Posterior tibial artery 1/4 bilateral, skin temperature warm to warm proximal to distal bilateral lower extremities, + varicosities and brawny hyperpigmentation, No pedal hair present bilateral. Trace edema bilateral.   Neurological: Gross sensation present via light touch bilateral.   Dermatological: Skin is warm, significantly dry, and supple bilateral, Nails 1-10 are tender, long, thick, and discolored with mild subungal debris, no webspace macerations present bilateral, no open lesions present bilateral, no callus/corns/hyperkeratotic tissue present bilateral. No signs of infection bilateral.  Musculoskeletal: Asymptomatic hammertoe boney deformities noted bilateral. Muscular strength within normal limits without pain or limitation on range of motion. No pain with calf compression bilateral.  Assessment and Plan:  Problem List Items Addressed This Visit    None    Visit Diagnoses    Dermatophytosis of nail    -  Primary    Dry skin  Hammertoe, unspecified laterality        Pain of toe, unspecified laterality        PVD (peripheral vascular disease) ([place])          -Examined patient.  -Discussed treatment options for painful mycotic nails. -Mechanically debrided and reduced mycotic nails with sterile nail nipper and dremel nail file without  incident. -Recommend daily skin emollients; bag balm and soaking for nail changes and use of tea tree oil or menthol cream to nails -Recommend elevation to assist with edema control -Recommend cont with good supportive shoes daily for foot type and use of rolling walker for ambulation assistance -Patient to return in 3 months for follow up evaluation or sooner if symptoms worsen.  Landis Martins, DPM

## 2016-02-12 DIAGNOSIS — X32XXXA Exposure to sunlight, initial encounter: Secondary | ICD-10-CM | POA: Diagnosis not present

## 2016-02-12 DIAGNOSIS — D0461 Carcinoma in situ of skin of right upper limb, including shoulder: Secondary | ICD-10-CM | POA: Diagnosis not present

## 2016-02-12 DIAGNOSIS — D0439 Carcinoma in situ of skin of other parts of face: Secondary | ICD-10-CM | POA: Diagnosis not present

## 2016-02-12 DIAGNOSIS — L570 Actinic keratosis: Secondary | ICD-10-CM | POA: Diagnosis not present

## 2016-02-12 DIAGNOSIS — D485 Neoplasm of uncertain behavior of skin: Secondary | ICD-10-CM | POA: Diagnosis not present

## 2016-04-11 ENCOUNTER — Emergency Department
Admission: EM | Admit: 2016-04-11 | Discharge: 2016-04-11 | Disposition: A | Discharge status: Home / Self Care | Payer: Medicare Other | Attending: Emergency Medicine | Admitting: Emergency Medicine

## 2016-04-11 ENCOUNTER — Encounter: Payer: Self-pay | Admitting: Emergency Medicine

## 2016-04-11 DIAGNOSIS — R519 Headache, unspecified: Secondary | ICD-10-CM

## 2016-04-11 DIAGNOSIS — Z7982 Long term (current) use of aspirin: Secondary | ICD-10-CM | POA: Diagnosis not present

## 2016-04-11 DIAGNOSIS — I10 Essential (primary) hypertension: Secondary | ICD-10-CM | POA: Diagnosis not present

## 2016-04-11 DIAGNOSIS — R51 Headache: Secondary | ICD-10-CM | POA: Diagnosis not present

## 2016-04-11 DIAGNOSIS — Z79899 Other long term (current) drug therapy: Secondary | ICD-10-CM | POA: Diagnosis not present

## 2016-04-11 NOTE — ED Provider Notes (Signed)
[facility] Emergency Department Provider Note  ____________________________________________  Time seen: Approximately 7:49 AM  I have reviewed the triage vital signs and the nursing notes.   HISTORY  Chief Complaint Headache (Pt. states she woke this morning with pressure behind eyes.)    HPI Cindy Estes is a 82 y.o. female sent to the ED for an abnormal sensation behind her eyes when she woke up this morning. She denies headache. Denies chest pain shortness of breath abdominal pain back pain. She just reports "I feel little bit off" but is unable to relate any specific symptoms. Currently she has no headache or abnormal eye pain and feels back to normal. No recent illness. No falls. Otherwise at baseline.  Patient reports the headache she was having was not severe, not sudden onset as far she can tell and was overall minor in severity, bilateral frontal.  Past Medical History:  Diagnosis Date  . Anxiety unk  . Chronic back pain unk  . Depression unk  . Hypertension      Patient Active Problem List   Diagnosis Date Noted  . Abnormal MMSE 01/29/2016  . Medicare annual wellness visit, initial 12/09/2015  . Memory difficulties 11/06/2015  . Altered bowel function 10/31/2015  . Congestion of respiratory tract 10/31/2015  . Acid reflux 10/31/2015  . Gravida 1 10/31/2015  . Flu vaccine need 10/31/2015  . Cannot sleep 10/31/2015  . Lumbar scoliosis 10/31/2015  . Multiple bruises 10/31/2015  . Fungal infection of toenail 10/31/2015  . Parity 1 10/31/2015  . PND (post-nasal drip) 10/31/2015  . Hypertension 06/09/2015  . Chronic back pain 06/09/2015  . Scoliosis of thoracic spine 06/09/2015  . Cardiac murmur 06/09/2015  . Atypical chest pain 06/09/2015  . Chronic LBP 06/09/2015  . Anxiety and depression 06/09/2015  . Low back pain with sciatica 06/09/2015  . Urge incontinence 06/09/2015  . B12 deficiency 06/09/2015  . Anxiety 03/07/2015  .  Edema leg 05/16/2014  . Fall in home 08/21/2012  . History of abdominal hernia 08/08/2011  . Personal history of other specified conditions 07/22/2011  . Lymphocytic colitis 07/08/2011     Past Surgical History:  Procedure Laterality Date  . ABDOMINAL HYSTERECTOMY  1973     Prior to Admission medications   Medication Sig Start Date End Date Taking? Authorizing Provider  acetaminophen-codeine (TYLENOL #3) 300-30 MG tablet Take 1 tablet by mouth every 12 (twelve) hours as needed for moderate pain. 01/29/16   Roselee Nova, MD  ALPRAZolam Duanne Moron) 1 MG tablet Take 1 tablet (1 mg total) by mouth at bedtime as needed for anxiety. 01/29/16   Roselee Nova, MD  aspirin EC 81 MG tablet Take by mouth.    Historical Provider, MD  calcium carbonate (TUMS - DOSED IN MG ELEMENTAL CALCIUM) 500 MG chewable tablet Chew 1 tablet by mouth daily.    Historical Provider, MD  cholecalciferol (VITAMIN D) 1000 units tablet Take 1,000 Units by mouth daily.    Historical Provider, MD  valsartan-hydrochlorothiazide (DIOVAN-HCT) 160-12.5 MG tablet TAKE ONE TABLET EVERY MORNING 12/24/15   Roselee Nova, MD  venlafaxine XR (EFFEXOR-XR) 75 MG 24 hr capsule Take 1 capsule (75 mg total) by mouth at bedtime. 01/29/16   Roselee Nova, MD     Allergies Review of patient's allergies indicates no known allergies.   Family History  Problem Relation Age of Onset  . Dementia Father   . Heart disease Maternal Grandmother   .  Diabetes Maternal Grandfather     Social History Social History  Substance Use Topics  . Smoking status: Never Smoker  . Smokeless tobacco: Never Used  . Alcohol use No    Review of Systems  Constitutional:   No fever or chills.  ENT:   No sore throat. No rhinorrhea. Cardiovascular:   No chest pain. Respiratory:   No dyspnea or cough. Gastrointestinal:   Negative for abdominal pain, vomiting and diarrhea.  Genitourinary:   Negative for dysuria or difficulty  urinating.  Neurological:   Mild headache this morning now resolved 10-point ROS otherwise negative.  ____________________________________________   PHYSICAL EXAM:  VITAL SIGNS: ED Triage Vitals  Enc Vitals Group     BP 04/11/16 0644 (!) 166/80     Pulse Rate 04/11/16 0644 81     Resp 04/11/16 0644 16     Temp 04/11/16 0644 98 F (36.7 C)     Temp Source 04/11/16 0644 Oral     SpO2 04/11/16 0644 95 %     Weight 04/11/16 0645 166 lb 3.2 oz (75.4 kg)     Height 04/11/16 0645 4\' 10"  (1.473 m)     Head Circumference --      Peak Flow --      Pain Score --      Pain Loc --      Pain Edu? --      Excl. in [place]? --     Vital signs reviewed, nursing assessments reviewed.   Constitutional:   Alert and oriented. Well appearing and in no distress. Eyes:   No scleral icterus. No conjunctival pallor. PERRL. EOMI.  No nystagmus. ENT   Head:   Normocephalic and atraumatic.   Nose:   No congestion/rhinnorhea. No septal hematoma   Mouth/Throat:   MMM, no pharyngeal erythema. No peritonsillar mass.    Neck:   No stridor. No SubQ emphysema. No meningismus. Hematological/Lymphatic/Immunilogical:   No cervical lymphadenopathy. Cardiovascular:   RRR. Symmetric bilateral radial and DP pulses.  No murmurs.  Respiratory:   Normal respiratory effort without tachypnea nor retractions. Breath sounds are clear and equal bilaterally. No wheezes/rales/rhonchi. Gastrointestinal:   Soft and nontender. Non distended. There is no CVA tenderness.  No rebound, rigidity, or guarding. Genitourinary:   deferred Musculoskeletal:   Nontender with normal range of motion in all extremities. No joint effusions.  No lower extremity tenderness.  No edema. Neurologic:   Normal speech and language.  CN 2-10 normal. Motor grossly intact. No gross focal neurologic deficits are appreciated.  Skin:    Skin is warm, dry and intact. No rash noted.  No petechiae, purpura, or  bullae.  ____________________________________________    LABS (pertinent positives/negatives) (all labs ordered are listed, but only abnormal results are displayed) Labs Reviewed - No data to display ____________________________________________   EKG    ____________________________________________    RADIOLOGY    ____________________________________________   PROCEDURES Procedures  ____________________________________________   INITIAL IMPRESSION / ASSESSMENT AND PLAN / ED COURSE  Pertinent labs & imaging results that were available during my care of the patient were reviewed by me and considered in my medical decision making (see chart for details).  Patient well appearing no acute distress. Only complaint is of a minor bilateral frontal headache which is now resolved. Exam is reassuring.Considering the patient's symptoms, medical history, and physical examination today, I have low suspicion for ischemic stroke, intracranial hemorrhage, meningitis, encephalitis, carotid or vertebral dissection, venous sinus thrombosis, MS, intracranial hypertension, glaucoma, CRAO, CRVO,  or temporal arteritis. We'll discharge home to follow up with primary care tomorrow to reassess progression of symptoms.     Clinical Course   ____________________________________________   FINAL CLINICAL IMPRESSION(S) / ED DIAGNOSES  Final diagnoses:  Nonintractable headache, unspecified chronicity pattern, unspecified headache type       Portions of this note were generated with dragon dictation software. Dictation errors may occur despite best attempts at proofreading.    Carrie Mew, MD 04/11/16 [phone]

## 2016-04-11 NOTE — ED Notes (Addendum)
Pt. States she woke this a.m. With pressure behind eyes.   Pt. Had elevated Blood pressure(190/91) when EMS arrived.   Pt. Lives at [facility] living.  Pt. States "My eyes feel hot".

## 2016-04-11 NOTE — ED Notes (Addendum)
Spoke with Lona at [facility] [phone]) who stated they will not have transportation for pt until after 0800. Stated they will call ED when they are able to arrange transport. Pt updated.

## 2016-04-11 NOTE — ED Triage Notes (Signed)
Pt. States she woke this a.m with a headache.  Pt. States it has resolved itself.

## 2016-04-11 NOTE — ED Notes (Signed)
Patient's discharge and follow up information reviewed with patient by ED nursing staff and patient given the opportunity to ask questions pertaining to ED visit and discharge plan of care. Patient advised that should symptoms not continue to improve, resolve entirely, or should new symptoms develop then a follow up visit with their PCP or a return visit to the ED may be warranted. Patient verbalized consent and understanding of discharge plan of care including potential need for further evaluation. Patient discharged in stable condition per attending ED physician on duty.   Pt returning to [facility] [place] ([facility]) with ODS officer Atlee Abide.

## 2016-04-28 DIAGNOSIS — Z23 Encounter for immunization: Secondary | ICD-10-CM | POA: Diagnosis not present

## 2016-05-03 ENCOUNTER — Encounter: Payer: Self-pay | Admitting: Family Medicine

## 2016-05-03 ENCOUNTER — Ambulatory Visit (INDEPENDENT_AMBULATORY_CARE_PROVIDER_SITE_OTHER): Payer: Medicare Other | Admitting: Family Medicine

## 2016-05-03 VITALS — BP 146/82 | HR 97 | Temp 97.9°F | Resp 16 | Ht <= 58 in | Wt 160.0 lb

## 2016-05-03 DIAGNOSIS — I10 Essential (primary) hypertension: Secondary | ICD-10-CM

## 2016-05-03 DIAGNOSIS — M546 Pain in thoracic spine: Secondary | ICD-10-CM

## 2016-05-03 DIAGNOSIS — F418 Other specified anxiety disorders: Secondary | ICD-10-CM | POA: Diagnosis not present

## 2016-05-03 DIAGNOSIS — F419 Anxiety disorder, unspecified: Secondary | ICD-10-CM

## 2016-05-03 DIAGNOSIS — G8929 Other chronic pain: Secondary | ICD-10-CM | POA: Diagnosis not present

## 2016-05-03 DIAGNOSIS — F329 Major depressive disorder, single episode, unspecified: Secondary | ICD-10-CM

## 2016-05-03 MED ORDER — ALPRAZOLAM 1 MG PO TABS: 1.0000 mg | ORAL_TABLET | Freq: Every evening | ORAL | 2 refills | Status: DC | PRN

## 2016-05-03 MED ORDER — ACETAMINOPHEN-CODEINE #3 300-30 MG PO TABS: 1.0000 | ORAL_TABLET | Freq: Two times a day (BID) | ORAL | 2 refills | Status: DC | PRN

## 2016-05-03 MED ORDER — VENLAFAXINE HCL ER 75 MG PO CP24: 75.0000 mg | ORAL_CAPSULE | Freq: Every day | ORAL | 0 refills | Status: DC

## 2016-05-03 MED ORDER — VALSARTAN-HYDROCHLOROTHIAZIDE 160-12.5 MG PO TABS: 1.0000 | ORAL_TABLET | Freq: Every morning | ORAL | 1 refills | Status: DC

## 2016-05-03 NOTE — Progress Notes (Signed)
Name: Cindy Estes   MRN: [id number]    DOB: 01-03-1924   Date:05/03/2016       Progress Note  Subjective  Chief Complaint  Chief Complaint  Patient presents with  . Follow-up    3 months    HPI  Hypertension: Pt. Presents for medication refill and follow up. Her blood pressure is elevated today, taking Diovan-HCT regularly, denies any symptoms including chest pain, dyspnea, or blurred vision.  She feels well otherwise.  Anxiety: Pt. Presents for medication refill and follow up on anxiety. She takes Alprazolam 1mg  at bedtime as needed for sleep,  She gets good sleep. However, she has occasional anxiety during the day for which she usually does not take anything.  Chronic Low Back Pain: Pt. Has chronic low back pain, history of scoliosis, pain rated at 5/10 right now. Pain is worse with walking, standing for long periods. She takes Tylenol # 3 every 12 hours as needed.     Past Medical History:  Diagnosis Date  . Anxiety unk  . Chronic back pain unk  . Depression unk  . Hypertension     Past Surgical History:  Procedure Laterality Date  . ABDOMINAL HYSTERECTOMY  1973    Family History  Problem Relation Age of Onset  . Dementia Father   . Heart disease Maternal Grandmother   . Diabetes Maternal Grandfather     Social History   Social History  . Marital status: Widowed    Spouse name: N/A  . Number of children: N/A  . Years of education: N/A   Occupational History  . Not on file.   Social History Main Topics  . Smoking status: Never Smoker  . Smokeless tobacco: Never Used  . Alcohol use No  . Drug use: Unknown  . Sexual activity: Not on file   Other Topics Concern  . Not on file   Social History Narrative  . No narrative on file     Current Outpatient Prescriptions:  .  acetaminophen-codeine (TYLENOL #3) 300-30 MG tablet, Take 1 tablet by mouth every 12 (twelve) hours as needed for moderate pain., Disp: 60 tablet, Rfl: 2 .  ALPRAZolam (XANAX) 1  MG tablet, Take 1 tablet (1 mg total) by mouth at bedtime as needed for anxiety., Disp: 30 tablet, Rfl: 2 .  aspirin EC 81 MG tablet, Take by mouth., Disp: , Rfl:  .  calcium carbonate (TUMS - DOSED IN MG ELEMENTAL CALCIUM) 500 MG chewable tablet, Chew 1 tablet by mouth daily., Disp: , Rfl:  .  cholecalciferol (VITAMIN D) 1000 units tablet, Take 1,000 Units by mouth daily., Disp: , Rfl:  .  valsartan-hydrochlorothiazide (DIOVAN-HCT) 160-12.5 MG tablet, TAKE ONE TABLET EVERY MORNING, Disp: 90 tablet, Rfl: 1 .  venlafaxine XR (EFFEXOR-XR) 75 MG 24 hr capsule, Take 1 capsule (75 mg total) by mouth at bedtime., Disp: 90 capsule, Rfl: 0  No Known Allergies   Review of Systems  Constitutional: Negative for chills and fever.  Eyes: Negative for blurred vision.  Respiratory: Negative for shortness of breath.   Cardiovascular: Negative for chest pain.  Musculoskeletal: Positive for back pain.  Neurological: Negative for headaches.  Psychiatric/Behavioral: Positive for depression. The patient is nervous/anxious.       Objective  Vitals:   05/03/16 1338  BP: (!) 146/82  Pulse: 97  Resp: 16  Temp: 97.9 F (36.6 C)  TempSrc: Oral  SpO2: 98%  Weight: 160 lb (72.6 kg)  Height: 4\' 10"  (1.473 m)  Physical Exam  Constitutional: She is oriented to person, place, and time and well-developed, well-nourished, and in no distress.  HENT:  Head: Normocephalic and atraumatic.  Cardiovascular: Normal rate, regular rhythm, S1 normal, S2 normal and normal heart sounds.   No murmur heard. Pulmonary/Chest: Effort normal and breath sounds normal. She has no wheezes.  Musculoskeletal:       Right ankle: She exhibits swelling. Tenderness.       Left ankle: She exhibits swelling. Tenderness.  Neurological: She is alert and oriented to person, place, and time.  Psychiatric: Mood, memory, affect and judgment normal.  Nursing note and vitals reviewed.     Assessment & Plan  1. Anxiety and  depression Stable, taking alprazolam at bedtime as needed and Effexor 75 mg daily, refills provided - venlafaxine XR (EFFEXOR-XR) 75 MG 24 hr capsule; Take 1 capsule (75 mg total) by mouth at bedtime.  Dispense: 90 capsule; Refill: 0 - ALPRAZolam (XANAX) 1 MG tablet; Take 1 tablet (1 mg total) by mouth at bedtime as needed for anxiety.  Dispense: 30 tablet; Refill: 2  2. Chronic midline thoracic back pain Chronic, has history of scoliosis. Taking Tylenol 3 every 12 hours as needed. - acetaminophen-codeine (TYLENOL #3) 300-30 MG tablet; Take 1 tablet by mouth every 12 (twelve) hours as needed for moderate pain.  Dispense: 60 tablet; Refill: 2  3. Essential hypertension  - valsartan-hydrochlorothiazide (DIOVAN-HCT) 160-12.5 MG tablet; Take 1 tablet by mouth every morning.  Dispense: 90 tablet; Refill: 1   Kyon Bentler [facility] 05/03/2016 1:57 PM

## 2016-05-31 ENCOUNTER — Ambulatory Visit: Payer: Medicare Other | Admitting: Family Medicine

## 2016-06-02 ENCOUNTER — Ambulatory Visit (INDEPENDENT_AMBULATORY_CARE_PROVIDER_SITE_OTHER): Payer: Medicare Other | Admitting: Family Medicine

## 2016-06-02 ENCOUNTER — Encounter: Payer: Self-pay | Admitting: Family Medicine

## 2016-06-02 VITALS — BP 140/68 | HR 102 | Temp 98.0°F | Resp 17 | Ht <= 58 in | Wt 158.5 lb

## 2016-06-02 DIAGNOSIS — R011 Cardiac murmur, unspecified: Secondary | ICD-10-CM

## 2016-06-02 DIAGNOSIS — F329 Major depressive disorder, single episode, unspecified: Secondary | ICD-10-CM

## 2016-06-02 DIAGNOSIS — F418 Other specified anxiety disorders: Secondary | ICD-10-CM

## 2016-06-02 DIAGNOSIS — F419 Anxiety disorder, unspecified: Principal | ICD-10-CM

## 2016-06-02 MED ORDER — ALPRAZOLAM 1 MG PO TABS: ORAL_TABLET | ORAL | 2 refills | Status: DC

## 2016-06-02 NOTE — Progress Notes (Signed)
Name: Cindy Estes   MRN: [id number]    DOB: 1923-12-06   Date:06/02/2016       Progress Note  Subjective  Chief Complaint  Chief Complaint  Patient presents with  . Referral    Discuss referral    HPI  Pt. Presents with her daughter to discuss what appears to be frequent panic attacks in the morning at the assisted living facility where the patient is currently residing. She has been waking up feeling anxious and jittery, has been taking Alprazolam 1 mg in the morning to help with panic attacks. In addition, she has also been feeling depressed, not wanting to participate in usual activities and has expressed to her that she wants to die, although she denies that any SI.   Past Medical History:  Diagnosis Date  . Anxiety unk  . Chronic back pain unk  . Depression unk  . Hypertension     Past Surgical History:  Procedure Laterality Date  . ABDOMINAL HYSTERECTOMY  1973    Family History  Problem Relation Age of Onset  . Dementia Father   . Heart disease Maternal Grandmother   . Diabetes Maternal Grandfather     Social History   Social History  . Marital status: Widowed    Spouse name: N/A  . Number of children: N/A  . Years of education: N/A   Occupational History  . Not on file.   Social History Main Topics  . Smoking status: Never Smoker  . Smokeless tobacco: Never Used  . Alcohol use No  . Drug use: Unknown  . Sexual activity: Not on file   Other Topics Concern  . Not on file   Social History Narrative  . No narrative on file     Current Outpatient Prescriptions:  .  acetaminophen-codeine (TYLENOL #3) 300-30 MG tablet, Take 1 tablet by mouth every 12 (twelve) hours as needed for moderate pain., Disp: 60 tablet, Rfl: 2 .  ALPRAZolam (XANAX) 1 MG tablet, Take 1 tablet (1 mg total) by mouth at bedtime as needed for anxiety., Disp: 30 tablet, Rfl: 2 .  aspirin EC 81 MG tablet, Take by mouth., Disp: , Rfl:  .  calcium carbonate (TUMS - DOSED IN MG  ELEMENTAL CALCIUM) 500 MG chewable tablet, Chew 1 tablet by mouth daily., Disp: , Rfl:  .  cholecalciferol (VITAMIN D) 1000 units tablet, Take 1,000 Units by mouth daily., Disp: , Rfl:  .  valsartan-hydrochlorothiazide (DIOVAN-HCT) 160-12.5 MG tablet, Take 1 tablet by mouth every morning., Disp: 90 tablet, Rfl: 1 .  venlafaxine XR (EFFEXOR-XR) 75 MG 24 hr capsule, Take 1 capsule (75 mg total) by mouth at bedtime., Disp: 90 capsule, Rfl: 0  No Known Allergies   ROS  Please see history of present illness for complete review of systems  Objective  Vitals:   06/02/16 1501  BP: 140/68  Pulse: (!) 102  Resp: 17  Temp: 98 F (36.7 C)  TempSrc: Oral  SpO2: 95%  Weight: 158 lb 8 oz (71.9 kg)  Height: 4\' 10"  (1.473 m)    Physical Exam  Constitutional: She is oriented to person, place, and time and well-developed, well-nourished, and in no distress.  HENT:  Head: Normocephalic.  Cardiovascular: Normal rate, regular rhythm, S1 normal and S2 normal.   Murmur heard.  Systolic murmur is present with a grade of 2/6  Holosystolic blowing murmur 2/6  Pulmonary/Chest: Breath sounds normal. She has no wheezes.  Neurological: She is alert and oriented to person,  place, and time.  Psychiatric: Mood, memory, affect and judgment normal.  Nursing note and vitals reviewed.     Assessment & Plan  1. Anxiety and depression Increase Alprazolam to 0.5 mg in the morning, continue with 1 mg at night. Refills provided. May consider referral to psychiatry if patient continues to have further episodes of panic attacks and/or depression. - ALPRAZolam (XANAX) 1 MG tablet; 1 mg at bedtime, 0.5 mg q AM PRN Anxiety  Dispense: 45 tablet; Refill: 2  2. Heart murmur Referral to cardiology for - Ambulatory referral to Cardiology  [facility] 06/02/2016 3:20 PM

## 2016-06-15 ENCOUNTER — Ambulatory Visit: Payer: Medicare Other | Admitting: Podiatry

## 2016-06-16 DIAGNOSIS — H524 Presbyopia: Secondary | ICD-10-CM | POA: Diagnosis not present

## 2016-06-16 DIAGNOSIS — H2513 Age-related nuclear cataract, bilateral: Secondary | ICD-10-CM | POA: Diagnosis not present

## 2016-06-21 ENCOUNTER — Ambulatory Visit: Payer: Medicare Other | Admitting: Podiatry

## 2016-07-01 ENCOUNTER — Ambulatory Visit (INDEPENDENT_AMBULATORY_CARE_PROVIDER_SITE_OTHER): Payer: Medicare Other | Admitting: Podiatry

## 2016-07-01 ENCOUNTER — Encounter: Payer: Self-pay | Admitting: Podiatry

## 2016-07-01 DIAGNOSIS — L603 Nail dystrophy: Secondary | ICD-10-CM

## 2016-07-01 DIAGNOSIS — L608 Other nail disorders: Secondary | ICD-10-CM

## 2016-07-01 DIAGNOSIS — L84 Corns and callosities: Secondary | ICD-10-CM

## 2016-07-01 DIAGNOSIS — M79609 Pain in unspecified limb: Secondary | ICD-10-CM | POA: Diagnosis not present

## 2016-07-01 DIAGNOSIS — M79672 Pain in left foot: Secondary | ICD-10-CM | POA: Diagnosis not present

## 2016-07-01 DIAGNOSIS — B351 Tinea unguium: Secondary | ICD-10-CM | POA: Diagnosis not present

## 2016-07-01 DIAGNOSIS — M79671 Pain in right foot: Secondary | ICD-10-CM

## 2016-07-02 ENCOUNTER — Ambulatory Visit: Payer: Medicare Other | Admitting: Cardiovascular Disease

## 2016-07-04 NOTE — Progress Notes (Signed)
SUBJECTIVE Patient  presents to office today complaining of elongated, thickened nails. Pain while ambulating in shoes. Patient is unable to trim their own nails.   OBJECTIVE General Patient is awake, alert, and oriented x 3 and in no acute distress. Derm painful hyperkeratotic skin lesion noted overlying the dorsal aspect of the PIPJ second digit left foot cyst that with callus lesion due to hammertoe contracture deformity. Skin is dry and supple bilateral. Negative open lesions or macerations. Remaining integument unremarkable. Nails are tender, long, thickened and dystrophic with subungual debris, consistent with onychomycosis, 1-5 bilateral. No signs of infection noted. Vasc  DP and PT pedal pulses palpable bilaterally. Temperature gradient within normal limits.  Neuro Epicritic and protective threshold sensation diminished bilaterally.  Musculoskeletal Exam No symptomatic pedal deformities noted bilateral. Muscular strength within normal limits.  ASSESSMENT 1. Onychodystrophic nails 1-5 bilateral with hyperkeratosis of nails.  2. Onychomycosis of nail due to dermatophyte bilateral 3. Pain in foot bilateral 4. Painful callus lesion second digit left foot secondary to hammertoe deformity 5. Hammertoe second digit left foot  PLAN OF CARE 1. Patient evaluated today.  2. Instructed to maintain good pedal hygiene and foot care.  3. Mechanical debridement of nails 1-5 bilaterally performed using a nail nipper. Filed with dremel without incident.  4. Excisional debridement of painful callus lesion was performed to the second digit left foot using a chisel blade without incident or bleeding 5. Return to clinic in 3 mos.   Edrick Kins, DPM [facility]  Dr. Edrick Kins, [place]                                        [place], [place] [address]                Office [phone]  Fax [phone]

## 2016-07-13 ENCOUNTER — Encounter: Payer: Self-pay | Admitting: Internal Medicine

## 2016-07-13 ENCOUNTER — Ambulatory Visit (INDEPENDENT_AMBULATORY_CARE_PROVIDER_SITE_OTHER): Payer: Medicare Other | Admitting: Internal Medicine

## 2016-07-13 VITALS — BP 122/84 | HR 87 | Ht <= 58 in | Wt 155.5 lb

## 2016-07-13 DIAGNOSIS — R011 Cardiac murmur, unspecified: Secondary | ICD-10-CM

## 2016-07-13 DIAGNOSIS — M7989 Other specified soft tissue disorders: Secondary | ICD-10-CM | POA: Diagnosis not present

## 2016-07-13 DIAGNOSIS — I10 Essential (primary) hypertension: Secondary | ICD-10-CM | POA: Diagnosis not present

## 2016-07-13 NOTE — Progress Notes (Signed)
New Outpatient Visit Date: 07/13/2016  Referring Provider: Roselee Nova, MD [address] [place], [place] [address]  Chief Complaint: Heart murmur  HPI:  Cindy Estes is a 82 y.o. year-old female with history of hypertension, MGUS, GERD, lymphocytic colitis, chronic back pain, depression, and anxiety, who has been referred by Dr. Manuella Ghazi for evaluation of a heart murmur.  The patient was previously followed by Dr. Nehemiah Massed, having last been seen in 04/2014.  At that time a "2+ MR murmur" was noted.  The patient is without complaints today, denying chest pain, shortness of breath, and lightheadedness.  Her daughter points out ankle edema, typically worse on the right though it will fluctuate from side to side at times. She also has reddish discoloration of her feet at times. She denies pain in her legs both at rest and with walking. She ambulates well with a rolling walker. She consumes one cup of coffee per day. She has had anxiety spells in the past with accompanying racing of the heart, though these have been well controlled by her current medical regimen. She has not had any falls.  --------------------------------------------------------------------------------------------------  Cardiovascular History & Procedures: Cardiovascular Problems:  Heart murmur  Risk Factors:  Hypertension and age > 35  Cath/PCI:  None  CV Surgery:  None  EP Procedures and Devices:  None  Non-Invasive Evaluation(s):  TTE (08/29/12, [facility]): Normal LV size with mild LVH and sigmoid septum.  LVEF > 55% with grade 1 diastolic dysfunction.  Mild left atrial enlargement.  Normal RV size and function.  Mild aortic sclerosis with trivial AR.  Trivial MR, TR, and PR.  Dynamic intracavitary gradient noted (18 mmHg resting; 48 mmHg with Valsalva)  Recent CV Pertinent Labs: Lab Results  Component Value Date   BNP 25.0 11/26/2015   K 3.9 11/26/2015   K 4.0 05/31/2014   BUN 26 (H)  11/26/2015   BUN 22 (H) 05/31/2014   CREATININE 0.80 11/26/2015   CREATININE 0.75 05/31/2014    --------------------------------------------------------------------------------------------------  Past Medical History:  Diagnosis Date  . Anxiety unk  . Chronic back pain unk  . Depression unk  . Hypertension   . Scoliosis    born with     Past Surgical History:  Procedure Laterality Date  . ABDOMINAL HYSTERECTOMY  1973    Outpatient Encounter Prescriptions as of 07/13/2016  Medication Sig  . acetaminophen-codeine (TYLENOL #3) 300-30 MG tablet Take 1 tablet by mouth every 12 (twelve) hours as needed for moderate pain.  Marland Kitchen ALPRAZolam (XANAX) 1 MG tablet 1 mg at bedtime, 0.5 mg q AM PRN Anxiety  . aspirin EC 81 MG tablet Take by mouth.  . calcium carbonate (TUMS - DOSED IN MG ELEMENTAL CALCIUM) 500 MG chewable tablet Chew 1 tablet by mouth daily.  . cholecalciferol (VITAMIN D) 1000 units tablet Take 1,000 Units by mouth daily.  . valsartan-hydrochlorothiazide (DIOVAN-HCT) 160-12.5 MG tablet Take 1 tablet by mouth every morning.  . venlafaxine XR (EFFEXOR-XR) 75 MG 24 hr capsule Take 1 capsule (75 mg total) by mouth at bedtime.   No facility-administered encounter medications on file as of 07/13/2016.     Allergies: Patient has no known allergies.  Social History   Social History  . Marital status: Widowed    Spouse name: N/A  . Number of children: N/A  . Years of education: N/A   Occupational History  . Not on file.   Social History Main Topics  . Smoking status: Never Smoker  .  Smokeless tobacco: Never Used  . Alcohol use No  . Drug use: No  . Sexual activity: Not on file   Other Topics Concern  . Not on file   Social History Narrative  . No narrative on file    Family History  Problem Relation Age of Onset  . Dementia Father   . Heart disease Maternal Grandmother   . Diabetes Maternal Grandfather   . Other Mother     Died during childbirth  . Heart  disease Maternal Uncle     Review of Systems: A 12-system review of systems was performed and was negative except as noted in the HPI.  --------------------------------------------------------------------------------------------------  Physical Exam: BP 122/84 (BP Location: Right Arm, Patient Position: Sitting, Cuff Size: Normal)   Pulse 87   Ht 4\' 10"  (1.473 m)   Wt 155 lb 8 oz (70.5 kg)   BMI 32.50 kg/m   General:  Well-developed, well-nourished elderly woman seated comfortably in the exam room. She is accompanied by her daughter. HEENT: No conjunctival pallor or scleral icterus.  Moist mucous membranes.  OP clear. Neck: Supple without lymphadenopathy, thyromegaly, JVD, or HJR.  No carotid bruit. Lungs: Normal work of breathing.  Clear to auscultation bilaterally without wheezes or crackles. Heart: Regular rate and rhythm 2/6 systolic murmur loudest at the right upper sternal border without radiation.  No appreciable change with Valsalva. No rubs or gallops. Non-displaced PMI. Abd: Bowel sounds present.  Soft, NT/ND without hepatosplenomegaly Ext: Trace right ankle edema.  Radial, PT, and DP pulses are 2+ bilaterally Skin: Both feet have somewhat reddish discoloration with notable dry and cracked skin involving both lower extremities. Neuro: CNIII-XII intact.  Strength and fine-touch sensation intact in upper and lower extremities bilaterally. Psych: Normal mood and affect.  EKG:  Normal sinus rhythm with low voltage in the precordial leads. No significant abnormalities or changes from prior tracing from 11/27/15.  Lab Results  Component Value Date   WBC 7.9 11/26/2015   HGB 14.0 11/26/2015   HCT 41.1 11/26/2015   MCV 99.8 11/26/2015   PLT 249 11/26/2015    Lab Results  Component Value Date   NA 139 11/26/2015   K 3.9 11/26/2015   CL 100 (L) 11/26/2015   CO2 28 11/26/2015   BUN 26 (H) 11/26/2015   CREATININE 0.80 11/26/2015   GLUCOSE 96 11/26/2015   ALT 13 (L)  11/26/2015   --------------------------------------------------------------------------------------------------  ASSESSMENT AND PLAN: Heart murmur Systolic heart murmur loudest in the aortic valve region is appreciated on exam today. Echocardiogram in 2014 revealed aortic sclerosis with mild regurgitation of all 4 cardiac valves. The patient was also noted to have LVH with a sigmoid septum and dynamic intracavitary gradient, which also could cause a systolic murmur. As the patient is asymptomatic, intervention is not warranted at this time. We will repeat an echocardiogram to further evaluate for significant structural abnormality leading to her heart murmur.  Lower extremity edema I suspect this is most likely dependent in nature. I have recommended the patient try wearing compression stockings to see if this helps improve the swelling and discoloration of her feet and ankles.  Hypertension Blood pressure upper normal with diastolic pressure of 84 mmHg. No medication changes at this time.  Follow-up: Return to clinic in 3 months.  Nelva Bush, MD 07/13/2016 2:46 PM

## 2016-07-13 NOTE — Patient Instructions (Signed)
Medication Instructions:  Your physician recommends that you continue on your current medications as directed. Please refer to the Current Medication list given to you today.   Testing/Procedures: Your physician has requested that you have an echocardiogram. Echocardiography is a painless test that uses sound waves to create images of your heart. It provides your doctor with information about the size and shape of your heart and how well your heart's chambers and valves are working. This procedure takes approximately one hour. There are no restrictions for this procedure.   Follow-Up: Your physician recommends that you schedule a follow-up appointment in: 3 MONTHS WITH DR END.  Dr End has ordered to you wear knee high compression stockings at 15-20 mmHg compression of both legs. You will be given a prescription to take to your medical supply store of your choice.   If you need a refill on your cardiac medications before your next appointment, please call your pharmacy.

## 2016-07-15 DIAGNOSIS — Z08 Encounter for follow-up examination after completed treatment for malignant neoplasm: Secondary | ICD-10-CM | POA: Diagnosis not present

## 2016-07-15 DIAGNOSIS — L821 Other seborrheic keratosis: Secondary | ICD-10-CM | POA: Diagnosis not present

## 2016-07-15 DIAGNOSIS — Z85828 Personal history of other malignant neoplasm of skin: Secondary | ICD-10-CM | POA: Diagnosis not present

## 2016-08-04 ENCOUNTER — Ambulatory Visit (INDEPENDENT_AMBULATORY_CARE_PROVIDER_SITE_OTHER): Payer: Medicare Other | Admitting: Family Medicine

## 2016-08-04 ENCOUNTER — Encounter: Payer: Self-pay | Admitting: Family Medicine

## 2016-08-04 ENCOUNTER — Other Ambulatory Visit: Payer: Medicare Other

## 2016-08-04 DIAGNOSIS — J069 Acute upper respiratory infection, unspecified: Secondary | ICD-10-CM | POA: Diagnosis not present

## 2016-08-04 DIAGNOSIS — F418 Other specified anxiety disorders: Secondary | ICD-10-CM | POA: Diagnosis not present

## 2016-08-04 DIAGNOSIS — F329 Major depressive disorder, single episode, unspecified: Secondary | ICD-10-CM

## 2016-08-04 DIAGNOSIS — F419 Anxiety disorder, unspecified: Principal | ICD-10-CM

## 2016-08-04 MED ORDER — AMOXICILLIN-POT CLAVULANATE 875-125 MG PO TABS: 1.0000 | ORAL_TABLET | Freq: Two times a day (BID) | ORAL | 0 refills | Status: DC

## 2016-08-04 MED ORDER — BENZONATATE 200 MG PO CAPS: 200.0000 mg | ORAL_CAPSULE | Freq: Two times a day (BID) | ORAL | 0 refills | Status: DC | PRN

## 2016-08-04 MED ORDER — ALPRAZOLAM 1 MG PO TABS: ORAL_TABLET | ORAL | 2 refills | Status: DC

## 2016-08-04 NOTE — Progress Notes (Signed)
Name: Cindy Estes   MRN: [id number]    DOB: 20-Apr-1924   Date:08/04/2016       Progress Note  Subjective  Chief Complaint  Chief Complaint  Patient presents with  . Follow-up    3 mo  . Medication Refill    xanax    Cough  This is a new problem. The current episode started in the past 7 days. Associated symptoms include a sore throat. Pertinent negatives include no chills, ear congestion, fever, nasal congestion or shortness of breath. She has tried nothing for the symptoms.  Anxiety  Presents for follow-up visit. Symptoms include depressed mood and excessive worry. Patient reports no nervous/anxious behavior or shortness of breath. The severity of symptoms is moderate.      Past Medical History:  Diagnosis Date  . Anxiety unk  . Chronic back pain unk  . Depression unk  . Hypertension   . Scoliosis    born with     Past Surgical History:  Procedure Laterality Date  . ABDOMINAL HYSTERECTOMY  1973    Family History  Problem Relation Age of Onset  . Dementia Father   . Heart disease Maternal Grandmother   . Diabetes Maternal Grandfather   . Other Mother     Died during childbirth  . Heart disease Maternal Uncle     Social History   Social History  . Marital status: Widowed    Spouse name: N/A  . Number of children: N/A  . Years of education: N/A   Occupational History  . Not on file.   Social History Main Topics  . Smoking status: Never Smoker  . Smokeless tobacco: Never Used  . Alcohol use No  . Drug use: No  . Sexual activity: Not on file   Other Topics Concern  . Not on file   Social History Narrative  . No narrative on file     Current Outpatient Prescriptions:  .  acetaminophen-codeine (TYLENOL #3) 300-30 MG tablet, Take 1 tablet by mouth every 12 (twelve) hours as needed for moderate pain., Disp: 60 tablet, Rfl: 2 .  ALPRAZolam (XANAX) 1 MG tablet, 1 mg at bedtime, 0.5 mg q AM PRN Anxiety, Disp: 45 tablet, Rfl: 2 .  aspirin EC 81 MG  tablet, Take by mouth., Disp: , Rfl:  .  cholecalciferol (VITAMIN D) 1000 units tablet, Take 1,000 Units by mouth daily., Disp: , Rfl:  .  valsartan-hydrochlorothiazide (DIOVAN-HCT) 160-12.5 MG tablet, Take 1 tablet by mouth every morning., Disp: 90 tablet, Rfl: 1 .  venlafaxine XR (EFFEXOR-XR) 75 MG 24 hr capsule, Take 1 capsule (75 mg total) by mouth at bedtime., Disp: 90 capsule, Rfl: 0 .  calcium carbonate (TUMS - DOSED IN MG ELEMENTAL CALCIUM) 500 MG chewable tablet, Chew 1 tablet by mouth daily., Disp: , Rfl:   No Known Allergies   Review of Systems  Constitutional: Negative for chills and fever.  HENT: Positive for sore throat.   Respiratory: Positive for cough. Negative for shortness of breath.   Psychiatric/Behavioral: The patient is not nervous/anxious.     Objective  Vitals:   08/04/16 1532  BP: 120/75  Pulse: (!) 104  Resp: 17  Temp: 99 F (37.2 C)  TempSrc: Oral  SpO2: 93%  Weight: 157 lb 4.8 oz (71.4 kg)  Height: 4\' 10"  (1.473 m)    Physical Exam  Constitutional: She is oriented to person, place, and time and well-developed, well-nourished, and in no distress.  HENT:  Head: Normocephalic and  atraumatic.  Nose: Right sinus exhibits no maxillary sinus tenderness and no frontal sinus tenderness. Left sinus exhibits no maxillary sinus tenderness and no frontal sinus tenderness.  Mouth/Throat: Posterior oropharyngeal erythema present.  Cardiovascular: Normal rate, regular rhythm and normal heart sounds.   No murmur heard. Pulmonary/Chest: Effort normal. No respiratory distress. She has wheezes in the right lower field.  Neurological: She is alert and oriented to person, place, and time.  Nursing note and vitals reviewed.      Assessment & Plan  1. Anxiety and depression Stable and responsive to alprazolam, refills provided - ALPRAZolam (XANAX) 1 MG tablet; 1 mg at bedtime, 0.5 mg q AM PRN Anxiety  Dispense: 45 tablet; Refill: 2  2. URI with cough and  congestion History of resolving however still has persistent congestion, will start on antitussive and antibiotic therapy. - benzonatate (TESSALON) 200 MG capsule; Take 1 capsule (200 mg total) by mouth 2 (two) times daily as needed for cough.  Dispense: 20 capsule; Refill: 0 - amoxicillin-clavulanate (AUGMENTIN) 875-125 MG tablet; Take 1 tablet by mouth 2 (two) times daily.  Dispense: 20 tablet; Refill: 0   Ardyce Heyer [facility] 08/04/2016 3:57 PM

## 2016-08-19 ENCOUNTER — Other Ambulatory Visit: Payer: Self-pay

## 2016-08-19 ENCOUNTER — Ambulatory Visit (INDEPENDENT_AMBULATORY_CARE_PROVIDER_SITE_OTHER): Payer: Medicare Other

## 2016-08-19 DIAGNOSIS — M7989 Other specified soft tissue disorders: Secondary | ICD-10-CM | POA: Diagnosis not present

## 2016-08-19 DIAGNOSIS — R011 Cardiac murmur, unspecified: Secondary | ICD-10-CM

## 2016-08-20 ENCOUNTER — Other Ambulatory Visit: Payer: Self-pay | Admitting: Family Medicine

## 2016-08-20 DIAGNOSIS — G8929 Other chronic pain: Secondary | ICD-10-CM

## 2016-08-20 DIAGNOSIS — M546 Pain in thoracic spine: Principal | ICD-10-CM

## 2016-09-09 ENCOUNTER — Emergency Department: Payer: Medicare Other

## 2016-09-09 ENCOUNTER — Emergency Department
Admission: EM | Admit: 2016-09-09 | Discharge: 2016-09-09 | Disposition: A | Discharge status: Home / Self Care | Payer: Medicare Other | Attending: Emergency Medicine | Admitting: Emergency Medicine

## 2016-09-09 DIAGNOSIS — Y999 Unspecified external cause status: Secondary | ICD-10-CM | POA: Diagnosis not present

## 2016-09-09 DIAGNOSIS — S0990XA Unspecified injury of head, initial encounter: Secondary | ICD-10-CM | POA: Diagnosis present

## 2016-09-09 DIAGNOSIS — Y929 Unspecified place or not applicable: Secondary | ICD-10-CM | POA: Diagnosis not present

## 2016-09-09 DIAGNOSIS — W19XXXA Unspecified fall, initial encounter: Secondary | ICD-10-CM | POA: Diagnosis not present

## 2016-09-09 DIAGNOSIS — W01198A Fall on same level from slipping, tripping and stumbling with subsequent striking against other object, initial encounter: Secondary | ICD-10-CM | POA: Insufficient documentation

## 2016-09-09 DIAGNOSIS — Y9301 Activity, walking, marching and hiking: Secondary | ICD-10-CM | POA: Diagnosis not present

## 2016-09-09 DIAGNOSIS — S0101XA Laceration without foreign body of scalp, initial encounter: Secondary | ICD-10-CM

## 2016-09-09 DIAGNOSIS — Z79899 Other long term (current) drug therapy: Secondary | ICD-10-CM | POA: Diagnosis not present

## 2016-09-09 DIAGNOSIS — R220 Localized swelling, mass and lump, head: Secondary | ICD-10-CM | POA: Diagnosis not present

## 2016-09-09 DIAGNOSIS — Z7982 Long term (current) use of aspirin: Secondary | ICD-10-CM | POA: Insufficient documentation

## 2016-09-09 DIAGNOSIS — I10 Essential (primary) hypertension: Secondary | ICD-10-CM | POA: Insufficient documentation

## 2016-09-09 LAB — COMPREHENSIVE METABOLIC PANEL
ALK PHOS: 54 U/L (ref 38–126)
ALT: 12 U/L — AB (ref 14–54)
AST: 23 U/L (ref 15–41)
Albumin: 3.7 g/dL (ref 3.5–5.0)
Anion gap: 8 (ref 5–15)
BILIRUBIN TOTAL: 0.7 mg/dL (ref 0.3–1.2)
BUN: 31 mg/dL — AB (ref 6–20)
CALCIUM: 9.3 mg/dL (ref 8.9–10.3)
CO2: 29 mmol/L (ref 22–32)
CREATININE: 0.85 mg/dL (ref 0.44–1.00)
Chloride: 100 mmol/L — ABNORMAL LOW (ref 101–111)
GFR calc Af Amer: >60 mL/min (ref >60)
GFR, EST NON AFRICAN AMERICAN: 59 mL/min — AB (ref >60)
Glucose, Bld: 103 mg/dL — ABNORMAL HIGH (ref 65–99)
Potassium: 4.3 mmol/L (ref 3.5–5.1)
Sodium: 137 mmol/L (ref 135–145)
TOTAL PROTEIN: 7.2 g/dL (ref 6.5–8.1)

## 2016-09-09 LAB — CBC WITH DIFFERENTIAL/PLATELET
BASOS ABS: 0.1 10*3/uL (ref 0–0.1)
BASOS PCT: 1 %
EOS ABS: 0 10*3/uL (ref 0–0.7)
EOS PCT: 1 %
HCT: 38.7 % (ref 35.0–47.0)
Hemoglobin: 13 g/dL (ref 12.0–16.0)
Lymphocytes Relative: 22 %
Lymphs Abs: 1.9 10*3/uL (ref 1.0–3.6)
MCH: 33.3 pg (ref 26.0–34.0)
MCHC: 33.7 g/dL (ref 32.0–36.0)
MCV: 98.8 fL (ref 80.0–100.0)
Monocytes Absolute: 0.8 10*3/uL (ref 0.2–0.9)
Monocytes Relative: 10 %
Neutro Abs: 5.9 10*3/uL (ref 1.4–6.5)
Neutrophils Relative %: 66 %
PLATELETS: 257 10*3/uL (ref 150–440)
RBC: 3.92 MIL/uL (ref 3.80–5.20)
RDW: 13.3 % (ref 11.5–14.5)
WBC: 8.8 10*3/uL (ref 3.6–11.0)

## 2016-09-09 MED ORDER — LIDOCAINE-EPINEPHRINE-TETRACAINE (LET) SOLUTION
NASAL | Status: AC
  Filled 2016-09-09: qty 3

## 2016-09-09 MED ORDER — LIDOCAINE HCL (PF) 1 % IJ SOLN
INTRAMUSCULAR | Status: AC
  Filled 2016-09-09: qty 5

## 2016-09-09 MED ORDER — SODIUM CHLORIDE 0.9 % IV BOLUS (SEPSIS)
500.0000 mL | Freq: Once | INTRAVENOUS | Status: AC
  Administered 2016-09-09: 500 mL via INTRAVENOUS

## 2016-09-09 NOTE — ED Notes (Signed)
Assisted pt urinate via bed pan.  Pt alert and oriented x4 though she does not recall getting stiches earlier today.

## 2016-09-09 NOTE — ED Triage Notes (Signed)
Pt arrived via EMS from [facility] for reports of a witnessed fall. Pt reports tripped while walking to supper. EMS reports 121/56, 85 HR, 95% SpO2 RA. Pt with bleeding head laceration to left side forehead.

## 2016-09-09 NOTE — Discharge Instructions (Signed)
Please see current dressing on for 48 hours and then remove and clean with hydrogen peroxide and applied Neosporin and recall ever and change dressings every 24 hours. Return here to the emergency Department for persistent nausea, increasing headache, altered mental status, focal weakness, fever or any other new concerns. Sutures should be removed in 7 days.

## 2016-09-09 NOTE — ED Provider Notes (Signed)
Time Seen: Approximately 1832  I have reviewed the triage notes  Chief Complaint: Fall and Head Laceration   History of Present Illness: Cindy Estes is a 83 y.o. female *who presents with age at [facility] after a witnessed fall. Patient apparently tripped while walking to supper and struck her head on a call him. There was no loss of consciousness. Patient arrives with significant bleeding secondary to what appears to be some small skin arterial is a left forehead   4 cm laceration. The patient has no loss of consciousness and denies any neck pain, chest pain or abdominal pain. No other identified trauma from her fall.  The patient is not on any anticoagulant therapy Past Medical History:  Diagnosis Date  . Anxiety unk  . Chronic back pain unk  . Depression unk  . Hypertension   . Scoliosis    born with     Patient Active Problem List   Diagnosis Date Noted  . URI with cough and congestion 08/04/2016  . Abnormal MMSE 01/29/2016  . Medicare annual wellness visit, initial 12/09/2015  . Memory difficulties 11/06/2015  . Altered bowel function 10/31/2015  . Congestion of respiratory tract 10/31/2015  . Acid reflux 10/31/2015  . Gravida 1 10/31/2015  . Flu vaccine need 10/31/2015  . Cannot sleep 10/31/2015  . Lumbar scoliosis 10/31/2015  . Multiple bruises 10/31/2015  . Fungal infection of toenail 10/31/2015  . Parity 1 10/31/2015  . PND (post-nasal drip) 10/31/2015  . Hypertension 06/09/2015  . Chronic back pain 06/09/2015  . Scoliosis of thoracic spine 06/09/2015  . Cardiac murmur 06/09/2015  . Atypical chest pain 06/09/2015  . Chronic LBP 06/09/2015  . Anxiety and depression 06/09/2015  . Low back pain with sciatica 06/09/2015  . Urge incontinence 06/09/2015  . B12 deficiency 06/09/2015  . Anxiety 03/07/2015  . Edema leg 05/16/2014  . Fall in home 08/21/2012  . History of abdominal hernia 08/08/2011  . Personal history of other specified conditions 07/22/2011   . Lymphocytic colitis 07/08/2011    Past Surgical History:  Procedure Laterality Date  . ABDOMINAL HYSTERECTOMY  1973    Past Surgical History:  Procedure Laterality Date  . ABDOMINAL HYSTERECTOMY  1973    Current Outpatient Rx  . Order #: [id number] Class: Print  . Order #: [id number] Class: Print  . Order #: [id number] Class: Historical Med  . Order #: [id number] Class: Historical Med  . Order #: [id number] Class: Historical Med  . Order #: [id number] Class: Normal  . Order #: [id number] Class: Normal  . Order #: [id number] Class: Normal    Allergies:  Patient has no known allergies.  Family History: Family History  Problem Relation Age of Onset  . Dementia Father   . Heart disease Maternal Grandmother   . Diabetes Maternal Grandfather   . Other Mother     Died during childbirth  . Heart disease Maternal Uncle     Social History: Social History  Substance Use Topics  . Smoking status: Never Smoker  . Smokeless tobacco: Never Used  . Alcohol use No     Review of Systems:   10 point review of systems was performed and was otherwise negative: Prior to her fall Constitutional: No fever Eyes: No visual disturbances ENT: No sore throat, ear pain Cardiac: No chest pain Respiratory: No shortness of breath, wheezing, or stridor Abdomen: No abdominal pain, no vomiting, No diarrhea Endocrine: No weight loss, No night sweats Extremities: No peripheral edema, cyanosis Skin: No rashes, easy bruising Neurologic: No focal weakness,  trouble with speech or swollowing Urologic: No dysuria, Hematuria, or urinary frequency *  Physical Exam:  ED Triage Vitals  Enc Vitals Group     BP 09/09/16 1806 (!) 149/81     Pulse Rate 09/09/16 1806 81     Resp 09/09/16 1806 18     Temp 09/09/16 1806 98.1 F (36.7 C)     Temp Source 09/09/16 1806 Oral     SpO2 09/09/16 1806 95 %     Weight 09/09/16 1807 165 lb 9 oz (75.1 kg)     Height 09/09/16 1807 5\' 2"  (1.575 m)     Head  Circumference --      Peak Flow --      Pain Score 09/09/16 2217 0     Pain Loc --      Pain Edu? --      Excl. in [place]? --     General: Awake , Alert , and Oriented times 3; GCS 15 Head:4 cm linear laceration with 2 areas of pulsatile bleeding  Eyes: Pupils equal , round, reactive to light Nose/Throat: No nasal drainage, patent upper airway without erythema or exudate.  Neck: Supple, Full range of motion, No anterior adenopathy or palpable thyroid masses Lungs: Clear to ascultation without wheezes , rhonchi, or rales Heart: Regular rate, regular rhythm without murmurs , gallops , or rubs Abdomen: Soft, non tender without rebound, guarding , or rigidity; bowel sounds positive and symmetric in all 4 quadrants. No organomegaly .        Extremities: 2 plus symmetric pulses. No edema, clubbing or cyanosis Neurologic: normal ambulation, Motor symmetric without deficits, sensory intact Skin: warm, dry, no rashes   Labs:   All laboratory work was reviewed including any pertinent negatives or positives listed below:  Labs Reviewed  COMPREHENSIVE METABOLIC PANEL - Abnormal; Notable for the following:       Result Value   Chloride 100 (*)    Glucose, Bld 103 (*)    BUN 31 (*)    ALT 12 (*)    GFR calc non Af Amer 59 (*)    All other components within normal limits  CBC WITH DIFFERENTIAL/PLATELET    Radiology: * "Ct Head Wo Contrast  Result Date: 09/09/2016 CLINICAL DATA:  Status post witnessed fall. Left forehead laceration. Initial encounter. EXAM: CT HEAD WITHOUT CONTRAST TECHNIQUE: Contiguous axial images were obtained from the base of the skull through the vertex without intravenous contrast. COMPARISON:  CT of the head performed 11/18/2014 FINDINGS: Brain: No evidence of acute infarction, hemorrhage, hydrocephalus, extra-axial collection or mass lesion/mass effect. Prominence of the ventricles and sulci reflects mild to moderate cortical volume loss. Scattered periventricular and  subcortical matter change likely reflects small vessel ischemic microangiopathy. Cerebellar atrophy is noted. The brainstem and fourth ventricle are within normal limits. The basal ganglia are unremarkable in appearance. The cerebral hemispheres demonstrate grossly normal gray-white differentiation. No mass effect or midline shift is seen. Vascular: No hyperdense vessel or unexpected calcification. Skull: There is no evidence of fracture; visualized osseous structures are unremarkable in appearance. Sinuses/Orbits: The orbits are within normal limits. Trace air above the optic globes bilaterally is likely transient in nature. The paranasal sinuses and mastoid air cells are well-aerated. Other: Soft tissue swelling and laceration are noted overlying the left frontal and parietal calvarium. IMPRESSION: 1. No evidence of traumatic intracranial injury or fracture. 2. Soft tissue swelling and laceration overlying the left frontal and parietal calvarium. 3. Mild to moderate cortical volume loss  and scattered small vessel ischemic microangiopathy. Electronically Signed   By: Garald Balding M.D.   On: 09/09/2016 19:16  "  I personally reviewed the radiologic studies   Procedures: * Left forehead laceration repair Patient's forehead was prepped and draped in sterile fashion. The wound was anesthetized with 1% lidocaine without epinephrine The patient had 2 5-0 Vicryl sutures established subcutaneously to tie off small arterial bleeds. The patient then had closure with 4 interrupted 4. 0 Prolene sutures for skin realignment. The wound was cleaned and dressed and had Surgicel and Steri-Strips applied over top 1 with a pressure dressing.   ED Course:  The patient's laceration was repaired first due to the skin arterial bleeding. She appeared to tolerate the procedure well and then was taken that the head CT area which shows no evidence of a subdural epidural hematoma, skull fracture or any other acute pathology.  Chin CBC was performed and she does not appear to be anemic and eventually was up and ambulatory. The patient was discharged home with her daughter with all questions and concerns addressed at the bedside.     Assessment:  Acute closed head injury 4 cm complicated laceration with 2 layer closure   Final Clinical Impression:   Final diagnoses:  Injury of head, initial encounter  Laceration of scalp without foreign body, initial encounter     Plan: * Outpatient Patient was advised to return immediately if condition worsens. Patient was advised to follow up with their primary care physician or other specialized physicians involved in their outpatient care. The patient and/or family member/power of attorney had laboratory results reviewed at the bedside. All questions and concerns were addressed and appropriate discharge instructions were distributed by the nursing staff.            Daymon Larsen, MD 09/09/16 2350

## 2016-09-09 NOTE — ED Notes (Signed)

## 2016-09-30 ENCOUNTER — Ambulatory Visit: Payer: Medicare Other | Admitting: Podiatry

## 2016-10-04 ENCOUNTER — Ambulatory Visit: Payer: Medicare Other | Admitting: Podiatry

## 2016-10-07 ENCOUNTER — Encounter
Admission: RE | Admit: 2016-10-07 | Discharge: 2016-10-07 | Disposition: A | Discharge status: Home / Self Care | Payer: Medicare Other | Source: Ambulatory Visit | Attending: Internal Medicine | Admitting: Internal Medicine

## 2016-10-07 DIAGNOSIS — R4182 Altered mental status, unspecified: Secondary | ICD-10-CM | POA: Insufficient documentation

## 2016-10-07 DIAGNOSIS — Z79899 Other long term (current) drug therapy: Secondary | ICD-10-CM | POA: Insufficient documentation

## 2016-10-07 DIAGNOSIS — R410 Disorientation, unspecified: Secondary | ICD-10-CM | POA: Insufficient documentation

## 2016-10-11 ENCOUNTER — Other Ambulatory Visit: Payer: Self-pay | Admitting: Family Medicine

## 2016-10-12 ENCOUNTER — Ambulatory Visit: Payer: Medicare Other | Admitting: Internal Medicine

## 2016-10-15 ENCOUNTER — Other Ambulatory Visit: Payer: Self-pay | Admitting: Gerontology

## 2016-10-15 ENCOUNTER — Other Ambulatory Visit
Admission: RE | Admit: 2016-10-15 | Discharge: 2016-10-15 | Disposition: A | Discharge status: Home / Self Care | Payer: Medicare Other | Source: Skilled Nursing Facility | Attending: Internal Medicine | Admitting: Internal Medicine

## 2016-10-15 DIAGNOSIS — R4182 Altered mental status, unspecified: Secondary | ICD-10-CM | POA: Diagnosis not present

## 2016-10-15 DIAGNOSIS — R410 Disorientation, unspecified: Secondary | ICD-10-CM | POA: Insufficient documentation

## 2016-10-15 LAB — URINALYSIS, COMPLETE (UACMP) WITH MICROSCOPIC
Bilirubin Urine: NEGATIVE
GLUCOSE, UA: NEGATIVE mg/dL
HGB URINE DIPSTICK: NEGATIVE
Ketones, ur: NEGATIVE mg/dL
NITRITE: POSITIVE — AB
PH: 5 (ref 5.0–8.0)
Protein, ur: NEGATIVE mg/dL
Specific Gravity, Urine: 1.017 (ref 1.005–1.030)

## 2016-10-17 LAB — URINE CULTURE: Culture: >=100000 — AB

## 2016-10-18 DIAGNOSIS — M6281 Muscle weakness (generalized): Secondary | ICD-10-CM | POA: Diagnosis not present

## 2016-10-18 DIAGNOSIS — R262 Difficulty in walking, not elsewhere classified: Secondary | ICD-10-CM | POA: Diagnosis not present

## 2016-10-18 DIAGNOSIS — R41841 Cognitive communication deficit: Secondary | ICD-10-CM | POA: Diagnosis not present

## 2016-10-19 DIAGNOSIS — F330 Major depressive disorder, recurrent, mild: Secondary | ICD-10-CM | POA: Diagnosis not present

## 2016-10-19 DIAGNOSIS — R410 Disorientation, unspecified: Secondary | ICD-10-CM | POA: Diagnosis not present

## 2016-10-19 DIAGNOSIS — M6281 Muscle weakness (generalized): Secondary | ICD-10-CM | POA: Diagnosis not present

## 2016-10-19 DIAGNOSIS — Z79899 Other long term (current) drug therapy: Secondary | ICD-10-CM | POA: Diagnosis not present

## 2016-10-19 DIAGNOSIS — R41841 Cognitive communication deficit: Secondary | ICD-10-CM | POA: Diagnosis not present

## 2016-10-19 DIAGNOSIS — F0390 Unspecified dementia without behavioral disturbance: Secondary | ICD-10-CM | POA: Diagnosis not present

## 2016-10-19 DIAGNOSIS — W19XXXD Unspecified fall, subsequent encounter: Secondary | ICD-10-CM | POA: Diagnosis not present

## 2016-10-19 DIAGNOSIS — R4182 Altered mental status, unspecified: Secondary | ICD-10-CM | POA: Diagnosis not present

## 2016-10-19 DIAGNOSIS — R262 Difficulty in walking, not elsewhere classified: Secondary | ICD-10-CM | POA: Diagnosis not present

## 2016-10-19 DIAGNOSIS — I10 Essential (primary) hypertension: Secondary | ICD-10-CM | POA: Diagnosis not present

## 2016-10-19 DIAGNOSIS — Y92009 Unspecified place in unspecified non-institutional (private) residence as the place of occurrence of the external cause: Secondary | ICD-10-CM | POA: Diagnosis not present

## 2016-10-19 LAB — CBC WITH DIFFERENTIAL/PLATELET
BASOS PCT: 1 %
Basophils Absolute: 0.1 10*3/uL (ref 0–0.1)
EOS ABS: 0 10*3/uL (ref 0–0.7)
EOS PCT: 0 %
HCT: 39.6 % (ref 35.0–47.0)
HEMOGLOBIN: 13.5 g/dL (ref 12.0–16.0)
LYMPHS ABS: 2 10*3/uL (ref 1.0–3.6)
Lymphocytes Relative: 24 %
MCH: 33.8 pg (ref 26.0–34.0)
MCHC: 34.1 g/dL (ref 32.0–36.0)
MCV: 98.9 fL (ref 80.0–100.0)
MONOS PCT: 10 %
Monocytes Absolute: 0.9 10*3/uL (ref 0.2–0.9)
NEUTROS PCT: 65 %
Neutro Abs: 5.5 10*3/uL (ref 1.4–6.5)
PLATELETS: 318 10*3/uL (ref 150–440)
RBC: 4 MIL/uL (ref 3.80–5.20)
RDW: 13.2 % (ref 11.5–14.5)
WBC: 8.5 10*3/uL (ref 3.6–11.0)

## 2016-10-19 LAB — COMPREHENSIVE METABOLIC PANEL
ALBUMIN: 3.6 g/dL (ref 3.5–5.0)
ALK PHOS: 52 U/L (ref 38–126)
ALT: 13 U/L — ABNORMAL LOW (ref 14–54)
ANION GAP: 9 (ref 5–15)
AST: 22 U/L (ref 15–41)
BUN: 43 mg/dL — ABNORMAL HIGH (ref 6–20)
CALCIUM: 8.9 mg/dL (ref 8.9–10.3)
CHLORIDE: 102 mmol/L (ref 101–111)
CO2: 25 mmol/L (ref 22–32)
Creatinine, Ser: 1.38 mg/dL — ABNORMAL HIGH (ref 0.44–1.00)
GFR calc non Af Amer: 33 mL/min — ABNORMAL LOW (ref >60)
GFR, EST AFRICAN AMERICAN: 38 mL/min — AB (ref >60)
GLUCOSE: 94 mg/dL (ref 65–99)
POTASSIUM: 4 mmol/L (ref 3.5–5.1)
SODIUM: 136 mmol/L (ref 135–145)
Total Bilirubin: 0.6 mg/dL (ref 0.3–1.2)
Total Protein: 7 g/dL (ref 6.5–8.1)

## 2016-10-19 LAB — TSH: TSH: 0.667 u[IU]/mL (ref 0.350–4.500)

## 2016-10-19 LAB — FOLATE: FOLATE: 11.6 ng/mL (ref >5.9)

## 2016-10-19 LAB — LIPID PANEL
CHOL/HDL RATIO: 3.3 ratio
Cholesterol: 198 mg/dL (ref 0–200)
HDL: 60 mg/dL (ref >40)
LDL CALC: 123 mg/dL — AB (ref 0–99)
TRIGLYCERIDES: 74 mg/dL (ref <150)
VLDL: 15 mg/dL (ref 0–40)

## 2016-10-19 LAB — MAGNESIUM: MAGNESIUM: 2.2 mg/dL (ref 1.7–2.4)

## 2016-10-19 LAB — VITAMIN B12: VITAMIN B 12: 625 pg/mL (ref 180–914)

## 2016-10-20 DIAGNOSIS — R262 Difficulty in walking, not elsewhere classified: Secondary | ICD-10-CM | POA: Diagnosis not present

## 2016-10-20 DIAGNOSIS — M6281 Muscle weakness (generalized): Secondary | ICD-10-CM | POA: Diagnosis not present

## 2016-10-20 DIAGNOSIS — R41841 Cognitive communication deficit: Secondary | ICD-10-CM | POA: Diagnosis not present

## 2016-10-20 LAB — VITAMIN D 25 HYDROXY (VIT D DEFICIENCY, FRACTURES): VIT D 25 HYDROXY: 43.3 ng/mL (ref 30.0–100.0)

## 2016-10-21 DIAGNOSIS — R262 Difficulty in walking, not elsewhere classified: Secondary | ICD-10-CM | POA: Diagnosis not present

## 2016-10-21 DIAGNOSIS — R41841 Cognitive communication deficit: Secondary | ICD-10-CM | POA: Diagnosis not present

## 2016-10-21 DIAGNOSIS — M6281 Muscle weakness (generalized): Secondary | ICD-10-CM | POA: Diagnosis not present

## 2016-10-22 DIAGNOSIS — R41841 Cognitive communication deficit: Secondary | ICD-10-CM | POA: Diagnosis not present

## 2016-10-22 DIAGNOSIS — R262 Difficulty in walking, not elsewhere classified: Secondary | ICD-10-CM | POA: Diagnosis not present

## 2016-10-22 DIAGNOSIS — M6281 Muscle weakness (generalized): Secondary | ICD-10-CM | POA: Diagnosis not present

## 2016-10-23 DIAGNOSIS — R41841 Cognitive communication deficit: Secondary | ICD-10-CM | POA: Diagnosis not present

## 2016-10-23 DIAGNOSIS — R262 Difficulty in walking, not elsewhere classified: Secondary | ICD-10-CM | POA: Diagnosis not present

## 2016-10-23 DIAGNOSIS — M6281 Muscle weakness (generalized): Secondary | ICD-10-CM | POA: Diagnosis not present

## 2016-10-24 ENCOUNTER — Encounter
Admission: RE | Admit: 2016-10-24 | Discharge: 2016-10-24 | Disposition: A | Discharge status: Home / Self Care | Payer: Medicare Other | Source: Ambulatory Visit | Attending: Internal Medicine | Admitting: Internal Medicine

## 2016-10-25 ENCOUNTER — Ambulatory Visit (INDEPENDENT_AMBULATORY_CARE_PROVIDER_SITE_OTHER): Payer: Medicare Other | Admitting: Podiatry

## 2016-10-25 ENCOUNTER — Encounter: Payer: Self-pay | Admitting: Podiatry

## 2016-10-25 DIAGNOSIS — R262 Difficulty in walking, not elsewhere classified: Secondary | ICD-10-CM | POA: Diagnosis not present

## 2016-10-25 DIAGNOSIS — M79609 Pain in unspecified limb: Secondary | ICD-10-CM

## 2016-10-25 DIAGNOSIS — M6281 Muscle weakness (generalized): Secondary | ICD-10-CM | POA: Diagnosis not present

## 2016-10-25 DIAGNOSIS — R41841 Cognitive communication deficit: Secondary | ICD-10-CM | POA: Diagnosis not present

## 2016-10-25 DIAGNOSIS — B351 Tinea unguium: Secondary | ICD-10-CM

## 2016-10-25 NOTE — Progress Notes (Signed)
Complaint:  Visit Type: Patient returns to my office for continued preventative foot care services. Complaint: Patient states" my nails have grown long and thick and become painful to walk and wear shoes" . The patient presents for preventative foot care services. No changes to ROS  Podiatric Exam: Vascular: dorsalis pedis and posterior tibial pulses are palpable bilateral. Capillary return is immediate. Temperature gradient is WNL. Skin turgor WNL  Sensorium: Normal Semmes Weinstein monofilament test. Normal tactile sensation bilaterally. Nail Exam: Pt has thick disfigured discolored nails with subungual debris noted bilateral entire nail hallux through fifth toenails Ulcer Exam: There is no evidence of ulcer or pre-ulcerative changes or infection. Orthopedic Exam: Muscle tone and strength are WNL. No limitations in general ROM. No crepitus or effusions noted. Foot type and digits show no abnormalities. Bony prominences are unremarkable. Skin: No Porokeratosis. No infection or ulcers  Diagnosis:  Onychomycosis, , Pain in right toe, pain in left toes  Treatment & Plan Procedures and Treatment: Consent by patient was obtained for treatment procedures. The patient understood the discussion of treatment and procedures well. All questions were answered thoroughly reviewed. Debridement of mycotic and hypertrophic toenails, 1 through 5 bilateral and clearing of subungual debris. No ulceration, no infection noted.  Return Visit-Office Procedure: Patient instructed to return to the office for a follow up visit 10 weeks  for continued evaluation and treatment.    Francie Keeling DPM 

## 2016-10-26 DIAGNOSIS — R41841 Cognitive communication deficit: Secondary | ICD-10-CM | POA: Diagnosis not present

## 2016-10-26 DIAGNOSIS — M6281 Muscle weakness (generalized): Secondary | ICD-10-CM | POA: Diagnosis not present

## 2016-10-26 DIAGNOSIS — R262 Difficulty in walking, not elsewhere classified: Secondary | ICD-10-CM | POA: Diagnosis not present

## 2016-10-27 ENCOUNTER — Non-Acute Institutional Stay: Payer: Medicare Other | Admitting: Gerontology

## 2016-10-27 DIAGNOSIS — F418 Other specified anxiety disorders: Secondary | ICD-10-CM | POA: Diagnosis not present

## 2016-10-27 DIAGNOSIS — F419 Anxiety disorder, unspecified: Secondary | ICD-10-CM | POA: Diagnosis not present

## 2016-10-27 DIAGNOSIS — F329 Major depressive disorder, single episode, unspecified: Secondary | ICD-10-CM

## 2016-10-27 DIAGNOSIS — R262 Difficulty in walking, not elsewhere classified: Secondary | ICD-10-CM | POA: Diagnosis not present

## 2016-10-27 DIAGNOSIS — M419 Scoliosis, unspecified: Secondary | ICD-10-CM

## 2016-10-27 DIAGNOSIS — I10 Essential (primary) hypertension: Secondary | ICD-10-CM

## 2016-10-27 DIAGNOSIS — Z9181 History of falling: Secondary | ICD-10-CM

## 2016-10-27 DIAGNOSIS — E538 Deficiency of other specified B group vitamins: Secondary | ICD-10-CM

## 2016-10-27 DIAGNOSIS — M6281 Muscle weakness (generalized): Secondary | ICD-10-CM | POA: Diagnosis not present

## 2016-10-27 DIAGNOSIS — R41841 Cognitive communication deficit: Secondary | ICD-10-CM | POA: Diagnosis not present

## 2016-10-28 DIAGNOSIS — R41841 Cognitive communication deficit: Secondary | ICD-10-CM | POA: Diagnosis not present

## 2016-10-28 DIAGNOSIS — R262 Difficulty in walking, not elsewhere classified: Secondary | ICD-10-CM | POA: Diagnosis not present

## 2016-10-28 DIAGNOSIS — M6281 Muscle weakness (generalized): Secondary | ICD-10-CM | POA: Diagnosis not present

## 2016-10-28 DIAGNOSIS — Z9181 History of falling: Secondary | ICD-10-CM | POA: Insufficient documentation

## 2016-10-28 NOTE — Progress Notes (Signed)
Location:      Place of Service:  ALF (13) Provider:  Toni Arthurs, NP-C  Keith Rake, MD  Patient Care Team: Roselee Nova, MD as PCP - General [facility])  Extended Emergency Contact Information Primary Emergency Contact: Horen,[name] Address: [address]          [place], [place] [address] [place] Phone: [phone] Relation: Daughter  Code Status:  dnr Goals of care: Advanced Directive information Advanced Directives 09/09/2016  Does Patient Have a Medical Advance Directive? No  Type of Advance Directive -  Does patient want to make changes to medical advance directive? -  Copy of [place] in Chart? -  Would patient like information on creating a medical advance directive? -     Chief Complaint  Patient presents with  . Medical Management of Chronic Issues    HPI:  Pt is a 83 y.o. female seen today for medical management of chronic diseases. Pt has been a resident of ALF for 3 weeks. She continues to verbalize that she wants to die, wondering why is she here? She is very hesitant to answer questions. She seems angry and sad. She has occasional inappropriate outbursts. She does not socialize much. She has not had any falls over the past 3 weeks. She did have a UTI on admit that was treated and resolved. She is scheduled for a CT scan of the brain tomorrow due the confusion and agitation. Otherwise, pt seems to be doing well medically. Emotionally, she is depressed/ dysmorphic. Will increase the venalafaxine for now. Will initiate Depakote next week since SNRI adjusted today. Will continue to monitor and follow up pending results of the CT. Appetite is fair. Having regular BMs. Needs assistance with ADLS. Ambulatory with walker. VSS. No other complaints,     Past Medical History:  Diagnosis Date  . Anxiety unk  . Chronic back pain unk  . Depression unk  . Hypertension   . Scoliosis    born with    Past Surgical  History:  Procedure Laterality Date  . ABDOMINAL HYSTERECTOMY  1973    No Known Allergies  Allergies as of 10/27/2016   No Known Allergies     Medication List       Accurate as of 10/27/16 11:59 PM. Always use your most recent med list.          acetaminophen-codeine 300-30 MG tablet Commonly known as:  TYLENOL #3 TAKE 1 TABLET BY MOUTH EVERY 12 HOURS FOR PAIN   ALPRAZolam 1 MG tablet Commonly known as:  XANAX 1 mg at bedtime, 0.5 mg q AM PRN Anxiety   aspirin EC 81 MG tablet Take by mouth.   benzonatate 200 MG capsule Commonly known as:  TESSALON Take 1 capsule (200 mg total) by mouth 2 (two) times daily as needed for cough.   calcium carbonate 500 MG chewable tablet Commonly known as:  TUMS - dosed in mg elemental calcium Chew 1 tablet by mouth daily.   valsartan-hydrochlorothiazide 160-12.5 MG tablet Commonly known as:  DIOVAN-HCT Take 1 tablet by mouth every morning.   venlafaxine XR 75 MG 24 hr capsule Commonly known as:  EFFEXOR-XR Take 1 capsule (75 mg total) by mouth at bedtime.   Vitamin D3 2000 units capsule Take 2,000 Units by mouth daily.       Review of Systems  Constitutional: Negative for activity change, appetite change, chills, diaphoresis and fever.  HENT: Negative for congestion, sneezing, sore  throat, trouble swallowing and voice change.   Respiratory: Negative for apnea, cough, choking, chest tightness, shortness of breath and wheezing.   Cardiovascular: Negative for chest pain, palpitations and leg swelling.  Gastrointestinal: Negative for abdominal distention, abdominal pain, constipation, diarrhea and nausea.  Genitourinary: Negative for difficulty urinating, dysuria, frequency and urgency.  Musculoskeletal: Positive for arthralgias (typical arthritis) and back pain. Negative for gait problem and myalgias.  Skin: Negative for color change, pallor, rash and wound.  Neurological: Negative for dizziness, tremors, syncope, speech  difficulty, weakness, numbness and headaches.  Psychiatric/Behavioral: Negative for agitation and behavioral problems.  All other systems reviewed and are negative.   Immunization History  Administered Date(s) Administered  . Influenza-Unspecified 04/25/2014, 04/26/2015, 04/26/2016  . Tdap 07/20/2015   Pertinent  Health Maintenance Due  Topic Date Due  . DEXA SCAN  04/07/1991  . PNA vac Low Risk Adult (1 of 2 - PCV13) 04/07/1991  . INFLUENZA VACCINE  02/23/2017   Fall Risk  08/04/2016 06/02/2016 01/29/2016 12/09/2015 09/11/2015  Falls in the past year? Yes No No No Yes  Number falls in past yr: 1 - - - 1  Injury with Fall? No - - - Yes  Risk Factor Category  - - - - -  Risk for fall due to : - - - - Other (Comment)  Risk for fall due to (comments): - - - - hit head on toilet when she fell  Follow up Falls evaluation completed - - - Falls evaluation completed   Functional Status Survey:    There were no vitals filed for this visit. There is no height or weight on file to calculate BMI. Physical Exam  Constitutional: She is oriented to person, place, and time. Vital signs are normal. She appears well-developed and well-nourished. She is active and cooperative. She does not appear ill. No distress.  HENT:  Head: Normocephalic and atraumatic.  Mouth/Throat: Uvula is midline, oropharynx is clear and moist and mucous membranes are normal. Mucous membranes are not pale, not dry and not cyanotic.  Eyes: Conjunctivae, EOM and lids are normal. Pupils are equal, round, and reactive to light.  Neck: Trachea normal, normal range of motion and full passive range of motion without pain. Neck supple. No JVD present. No tracheal deviation, no edema and no erythema present. No thyromegaly present.  Cardiovascular: Normal rate, regular rhythm, normal heart sounds, intact distal pulses and normal pulses.  Exam reveals no gallop, no distant heart sounds and no friction rub.   No murmur  heard. Pulmonary/Chest: Effort normal and breath sounds normal. No accessory muscle usage. No respiratory distress. She has no wheezes. She has no rales. She exhibits no tenderness.  Abdominal: Normal appearance and bowel sounds are normal. She exhibits no distension and no ascites. There is no tenderness.  Musculoskeletal: Normal range of motion. She exhibits no edema or tenderness.  Expected osteoarthritis, stiffness  Neurological: She is alert and oriented to person, place, and time. She has normal strength.  Skin: Skin is warm, dry and intact. No rash noted. She is not diaphoretic. No cyanosis or erythema. No pallor. Nails show no clubbing.  Psychiatric: She has a normal mood and affect. Her speech is normal and behavior is normal. Judgment and thought content normal. Cognition and memory are normal.  Nursing note and vitals reviewed.   Labs reviewed:  Recent Labs  11/26/15 0738 09/09/16 1930 10/19/16 1525  NA 139 137 136  K 3.9 4.3 4.0  CL 100* 100* 102  CO2  _0 GLUCOSE 96 103* 94  BUN 26* 31* 43*  CREATININE 0.80 0.85 1.38*  CALCIUM 9.3 9.3 8.9  MG  --   --  2.2    Recent Labs  11/26/15 0738 09/09/16 1930 10/19/16 1525  AST _1 ALT 13* 12* 13*  ALKPHOS 55 54 52  BILITOT 1.0 0.7 0.6  PROT 7.2 7.2 7.0  ALBUMIN 3.8 3.7 3.6    Recent Labs  11/26/15 0738 09/09/16 1930 10/19/16 1525  WBC 7.9 8.8 8.5  NEUTROABS  --  5.9 5.5  HGB 14.0 13.0 13.5  HCT 41.1 38.7 39.6  MCV 99.8 98.8 98.9  PLT 249 257 318   Lab Results  Component Value Date   TSH 0.667 10/19/2016   No results found for: HGBA1C Lab Results  Component Value Date   CHOL 198 10/19/2016   HDL 60 10/19/2016   LDLCALC 123 (H) 10/19/2016   TRIG 74 10/19/2016   CHOLHDL 3.3 10/19/2016    Significant Diagnostic Results in last 30 days:  No results found.  Assessment/Plan 1. Scoliosis of thoracic spine, unspecified scoliosis type  Stable  Continue Tylenol # 3- 1 tablet po Q 12  hours prn pain  2. Hx of falling  Stable   3. Essential hypertension  Stable  Continue Valsartan-HCTZ 160-12.5 mg po Q Day  Continue ASA 81 mg po Q Day  4. Anxiety  Variable  Continue Alprazolam 0.5 mg po Q am  Continue Alprazolam 1 mg po Q HS  5. Anxiety and depression  Unstable  Increase Venlafaxine to 150 mg po Q Day  Team Health Consult  Plan, initiate Depakote next week d/t today's increase in Velafaxine  6. Deficiency of other specified B group vitamins (CODE)  Stable  Continue cyanocobalamin 1,000 mcg/ ml injection on the first day of the month    Family/ staff Communication:   Total Time:  Documentation:  Face to Face:  Family/Phone:   Labs/tests ordered:  Cbc, met c, lipid panel, folate, tsh, B12, D, Mag+, CT head  Medication list reviewed and assessed for continued appropriateness. Monthly medication orders reviewed and signed.  Vikki Ports, NP-C Geriatrics [facility] [phone], [place] [address] Cell Phone (Mon-Fri 8am-5pm):  [phone] On Call:  [phone] & follow prompts after 5pm & weekends Office Phone:  [phone] Office Fax:  [phone]

## 2016-10-29 ENCOUNTER — Ambulatory Visit
Admission: RE | Admit: 2016-10-29 | Discharge: 2016-10-29 | Disposition: A | Discharge status: Home / Self Care | Payer: Medicare Other | Source: Ambulatory Visit | Attending: Gerontology | Admitting: Gerontology

## 2016-10-29 ENCOUNTER — Non-Acute Institutional Stay: Payer: Medicare Other | Admitting: Gerontology

## 2016-10-29 ENCOUNTER — Other Ambulatory Visit
Admission: RE | Admit: 2016-10-29 | Discharge: 2016-10-29 | Disposition: A | Discharge status: Home / Self Care | Payer: Medicare Other | Source: Skilled Nursing Facility | Attending: Gerontology | Admitting: Gerontology

## 2016-10-29 DIAGNOSIS — W19XXXA Unspecified fall, initial encounter: Secondary | ICD-10-CM | POA: Insufficient documentation

## 2016-10-29 DIAGNOSIS — M6281 Muscle weakness (generalized): Secondary | ICD-10-CM | POA: Diagnosis not present

## 2016-10-29 DIAGNOSIS — I739 Peripheral vascular disease, unspecified: Secondary | ICD-10-CM | POA: Insufficient documentation

## 2016-10-29 DIAGNOSIS — S41112A Laceration without foreign body of left upper arm, initial encounter: Secondary | ICD-10-CM | POA: Diagnosis not present

## 2016-10-29 DIAGNOSIS — R410 Disorientation, unspecified: Secondary | ICD-10-CM | POA: Insufficient documentation

## 2016-10-29 DIAGNOSIS — R41841 Cognitive communication deficit: Secondary | ICD-10-CM | POA: Diagnosis not present

## 2016-10-29 DIAGNOSIS — G319 Degenerative disease of nervous system, unspecified: Secondary | ICD-10-CM | POA: Diagnosis not present

## 2016-10-29 DIAGNOSIS — S0990XA Unspecified injury of head, initial encounter: Secondary | ICD-10-CM | POA: Diagnosis not present

## 2016-10-29 DIAGNOSIS — R262 Difficulty in walking, not elsewhere classified: Secondary | ICD-10-CM | POA: Diagnosis not present

## 2016-10-29 LAB — URINALYSIS, COMPLETE (UACMP) WITH MICROSCOPIC
BILIRUBIN URINE: NEGATIVE
Bacteria, UA: NONE SEEN
GLUCOSE, UA: NEGATIVE mg/dL
KETONES UR: NEGATIVE mg/dL
LEUKOCYTES UA: NEGATIVE
NITRITE: NEGATIVE
PROTEIN: NEGATIVE mg/dL
Specific Gravity, Urine: >1.046 — ABNORMAL HIGH (ref 1.005–1.030)
pH: 5 (ref 5.0–8.0)

## 2016-10-29 LAB — POCT I-STAT CREATININE: Creatinine, Ser: 1 mg/dL (ref 0.44–1.00)

## 2016-10-29 MED ORDER — IOPAMIDOL (ISOVUE-300) INJECTION 61%
75.0000 mL | Freq: Once | INTRAVENOUS | Status: AC | PRN
  Administered 2016-10-29: 75 mL via INTRAVENOUS

## 2016-10-30 LAB — URINE CULTURE

## 2016-11-01 DIAGNOSIS — R41841 Cognitive communication deficit: Secondary | ICD-10-CM | POA: Diagnosis not present

## 2016-11-01 DIAGNOSIS — M6281 Muscle weakness (generalized): Secondary | ICD-10-CM | POA: Diagnosis not present

## 2016-11-01 DIAGNOSIS — R262 Difficulty in walking, not elsewhere classified: Secondary | ICD-10-CM | POA: Diagnosis not present

## 2016-11-02 ENCOUNTER — Other Ambulatory Visit
Admission: RE | Admit: 2016-11-02 | Discharge: 2016-11-02 | Disposition: A | Discharge status: Home / Self Care | Payer: Medicare Other | Source: Ambulatory Visit | Attending: Gerontology | Admitting: Gerontology

## 2016-11-02 DIAGNOSIS — R262 Difficulty in walking, not elsewhere classified: Secondary | ICD-10-CM | POA: Diagnosis not present

## 2016-11-02 DIAGNOSIS — W19XXXA Unspecified fall, initial encounter: Secondary | ICD-10-CM | POA: Diagnosis not present

## 2016-11-02 DIAGNOSIS — M6281 Muscle weakness (generalized): Secondary | ICD-10-CM | POA: Diagnosis not present

## 2016-11-02 DIAGNOSIS — R410 Disorientation, unspecified: Secondary | ICD-10-CM | POA: Insufficient documentation

## 2016-11-02 DIAGNOSIS — R41841 Cognitive communication deficit: Secondary | ICD-10-CM | POA: Diagnosis not present

## 2016-11-02 LAB — URINALYSIS, COMPLETE (UACMP) WITH MICROSCOPIC
Bilirubin Urine: NEGATIVE
GLUCOSE, UA: NEGATIVE mg/dL
Hgb urine dipstick: NEGATIVE
Ketones, ur: 5 mg/dL — AB
NITRITE: NEGATIVE
PH: 5 (ref 5.0–8.0)
Protein, ur: NEGATIVE mg/dL
SPECIFIC GRAVITY, URINE: 1.025 (ref 1.005–1.030)

## 2016-11-03 ENCOUNTER — Ambulatory Visit: Payer: Medicare Other | Admitting: Family Medicine

## 2016-11-03 DIAGNOSIS — M6281 Muscle weakness (generalized): Secondary | ICD-10-CM | POA: Diagnosis not present

## 2016-11-03 DIAGNOSIS — R41841 Cognitive communication deficit: Secondary | ICD-10-CM | POA: Diagnosis not present

## 2016-11-03 DIAGNOSIS — R262 Difficulty in walking, not elsewhere classified: Secondary | ICD-10-CM | POA: Diagnosis not present

## 2016-11-03 LAB — URINE CULTURE: Culture: NO GROWTH

## 2016-11-05 DIAGNOSIS — M6281 Muscle weakness (generalized): Secondary | ICD-10-CM | POA: Diagnosis not present

## 2016-11-05 DIAGNOSIS — R41841 Cognitive communication deficit: Secondary | ICD-10-CM | POA: Diagnosis not present

## 2016-11-05 DIAGNOSIS — R262 Difficulty in walking, not elsewhere classified: Secondary | ICD-10-CM | POA: Diagnosis not present

## 2016-11-05 NOTE — Progress Notes (Signed)
Location:      Place of Service:  ALF (13) Provider:  Toni Arthurs, NP-C  Keith Rake, MD  Patient Care Team: Roselee Nova, MD as PCP - General [facility])  Extended Emergency Contact Information Primary Emergency Contact: Beever,[name] Address: [address]          [place], [place] [address] [place] Phone: [phone] Relation: Daughter  Code Status:  DNR Goals of care: Advanced Directive information Advanced Directives 09/09/2016  Does Patient Have a Medical Advance Directive? No  Type of Advance Directive -  Does patient want to make changes to medical advance directive? -  Copy of [place] in Chart? -  Would patient like information on creating a medical advance directive? -     Chief Complaint  Patient presents with  . Acute Visit    HPI:  Pt is a 83 y.o. female seen today for an acute visit for laceration of the left arm. Pt had a unwitnessed fall this morning. It is thought that her arm slid on the carpet causing a large skin tear. Staff reports a lot of blood on the floor in front of the bathroom door. She denies hitting her head. No evidence of head trauma. Pt went this morning for a CT of her head d/t increased recent confusion. This was negative for new findings. The laceration is on the posterior surface of the left upper arm, just above the elbow. It is an "L" shaped wound, ~ 6 x 6 cm with depth of ~ 1.5 cm at the area of deepest insult. Tissue is beefy red with, now, mild "oozing" bleeding. Skin borders are clean with a viable skin flap. Verbal consent obtained. Pt placed on Right side lying position on the bed. Area draped. Area numbed using Xylocaine 1%- injected every ~1/2 inch around the wound. Area evaluated for complete numbness. Area cleaned with Betadine and flushed with NS. No obvious visible contamination. 15 simple interrupted sutures placed, approximating the wound borders. 2 steri-strips placed at areas of  tension. Wound cleaned at completion. 4 steri-strips placed on superficial skin tear proximal to the laceration. This area covered with Allevyn dressing. Area of laceration covered with Telfa and wrapped with kerlix. Orders placed for dressing to be changed daily and prn. (Allevyn changed Q 5 days.) Pt tolerated procedure well. Afterwards, pt immediately got back up to the chair and ate lunch. Daughter expressed appreciation for procedure. Pt is confused and asked daughter multiple times where she was, what had happened, etc. Daughter re-oriented pt multiple times. Will monitor pt's cognition. This confusion is not new (since the fall.) VSS. No other complaints.     Past Medical History:  Diagnosis Date  . Anxiety unk  . Chronic back pain unk  . Depression unk  . Hypertension   . Scoliosis    born with    Past Surgical History:  Procedure Laterality Date  . ABDOMINAL HYSTERECTOMY  1973    No Known Allergies  Allergies as of 10/29/2016   No Known Allergies     Medication List       Accurate as of 10/29/16 11:59 PM. Always use your most recent med list.          acetaminophen-codeine 300-30 MG tablet Commonly known as:  TYLENOL #3 TAKE 1 TABLET BY MOUTH EVERY 12 HOURS FOR PAIN   ALPRAZolam 1 MG tablet Commonly known as:  XANAX 1 mg at bedtime, 0.5 mg q AM PRN  Anxiety   aspirin EC 81 MG tablet Take by mouth.   benzonatate 200 MG capsule Commonly known as:  TESSALON Take 1 capsule (200 mg total) by mouth 2 (two) times daily as needed for cough.   calcium carbonate 500 MG chewable tablet Commonly known as:  TUMS - dosed in mg elemental calcium Chew 1 tablet by mouth daily.   valsartan-hydrochlorothiazide 160-12.5 MG tablet Commonly known as:  DIOVAN-HCT Take 1 tablet by mouth every morning.   venlafaxine XR 75 MG 24 hr capsule Commonly known as:  EFFEXOR-XR Take 1 capsule (75 mg total) by mouth at bedtime.   Vitamin D3 2000 units capsule Take 2,000 Units by mouth  daily.       Review of Systems  Constitutional: Negative for activity change, appetite change, chills, diaphoresis and fever.  HENT: Negative.   Respiratory: Negative.   Cardiovascular: Negative.   Gastrointestinal: Negative.   Genitourinary: Negative.   Musculoskeletal: Positive for arthralgias (typical arthritis) and back pain. Negative for gait problem and myalgias.  Skin: Positive for wound. Negative for color change, pallor and rash.  Neurological: Positive for weakness and numbness (with the Xylocaine). Negative for dizziness, tremors, syncope, speech difficulty, light-headedness and headaches.  Psychiatric/Behavioral: Positive for agitation and confusion. Negative for behavioral problems. The patient is nervous/anxious.   All other systems reviewed and are negative.   Immunization History  Administered Date(s) Administered  . Influenza-Unspecified 04/25/2014, 04/26/2015, 04/26/2016  . Tdap 07/20/2015   Pertinent  Health Maintenance Due  Topic Date Due  . DEXA SCAN  04/07/1991  . PNA vac Low Risk Adult (1 of 2 - PCV13) 04/07/1991  . INFLUENZA VACCINE  02/23/2017   Fall Risk  08/04/2016 06/02/2016 01/29/2016 12/09/2015 09/11/2015  Falls in the past year? Yes No No No Yes  Number falls in past yr: 1 - - - 1  Injury with Fall? No - - - Yes  Risk Factor Category  - - - - -  Risk for fall due to : - - - - Other (Comment)  Risk for fall due to (comments): - - - - hit head on toilet when she fell  Follow up Falls evaluation completed - - - Falls evaluation completed   Functional Status Survey:    Vitals:   10/27/16 1530  BP: (!) 125/52  Pulse: 68  Temp: 97.4 F (36.3 C)  SpO2: 96%  Weight: 148 lb 12.8 oz (67.5 kg)   Body mass index is 27.22 kg/m. Physical Exam  Constitutional: She is oriented to person, place, and time. Vital signs are normal. She appears well-developed and well-nourished. She is active and cooperative. She does not appear ill. No distress.  HENT:    Head: Normocephalic and atraumatic.  Mouth/Throat: Uvula is midline, oropharynx is clear and moist and mucous membranes are normal. Mucous membranes are not pale, not dry and not cyanotic.  Eyes: Conjunctivae, EOM and lids are normal. Pupils are equal, round, and reactive to light.  Neck: Trachea normal, normal range of motion and full passive range of motion without pain. Neck supple. No JVD present. No tracheal deviation, no edema and no erythema present. No thyromegaly present.  Cardiovascular: Normal rate, regular rhythm, normal heart sounds, intact distal pulses and normal pulses.  Exam reveals no gallop, no distant heart sounds and no friction rub.   No murmur heard. Pulses:      Radial pulses are 2+ on the right side, and 2+ on the left side.  Pulmonary/Chest: Effort normal and breath  sounds normal. No accessory muscle usage. No respiratory distress. She has no decreased breath sounds. She has no wheezes. She has no rhonchi. She has no rales. She exhibits no tenderness.  Abdominal: Normal appearance and bowel sounds are normal. She exhibits no distension and no ascites. There is no tenderness.  Musculoskeletal: Normal range of motion. She exhibits no edema or tenderness.  Expected osteoarthritis, stiffness  Neurological: She is alert and oriented to person, place, and time. She has normal strength.  Skin: Skin is warm and dry. Abrasion (proximal to laceration) and laceration noted. No rash noted. She is not diaphoretic. No cyanosis or erythema. No pallor. Nails show no clubbing.     Psychiatric: Her speech is normal and behavior is normal. Judgment and thought content normal. Her mood appears anxious. Cognition and memory are impaired. She exhibits abnormal recent memory.  Nursing note and vitals reviewed.   Labs reviewed:  Recent Labs  11/26/15 0738 09/09/16 1930 10/19/16 1525 10/29/16 0848  NA 139 137 136  --   K 3.9 4.3 4.0  --   CL 100* 100* 102  --   CO2 28 29 25   --    GLUCOSE 96 103* 94  --   BUN 26* 31* 43*  --   CREATININE 0.80 0.85 1.38* 1.00  CALCIUM 9.3 9.3 8.9  --   MG  --   --  2.2  --     Recent Labs  11/26/15 0738 09/09/16 1930 10/19/16 1525  AST 20 23 22   ALT 13* 12* 13*  ALKPHOS 55 54 52  BILITOT 1.0 0.7 0.6  PROT 7.2 7.2 7.0  ALBUMIN 3.8 3.7 3.6    Recent Labs  11/26/15 0738 09/09/16 1930 10/19/16 1525  WBC 7.9 8.8 8.5  NEUTROABS  --  5.9 5.5  HGB 14.0 13.0 13.5  HCT 41.1 38.7 39.6  MCV 99.8 98.8 98.9  PLT 249 257 318   Lab Results  Component Value Date   TSH 0.667 10/19/2016   No results found for: HGBA1C Lab Results  Component Value Date   CHOL 198 10/19/2016   HDL 60 10/19/2016   LDLCALC 123 (H) 10/19/2016   TRIG 74 10/19/2016   CHOLHDL 3.3 10/19/2016    Significant Diagnostic Results in last 30 days:  Ct Head W & Wo Contrast  Result Date: 10/29/2016 CLINICAL DATA:  Evaluate for dementia.   Multiple falls. EXAM: CT HEAD WITHOUT AND WITH CONTRAST TECHNIQUE: Contiguous axial images were obtained from the base of the skull through the vertex without and with intravenous contrast CONTRAST:  69mL ISOVUE-300 IOPAMIDOL (ISOVUE-300) INJECTION 61% COMPARISON:  CT head 08/30/2016 without contrast. FINDINGS: Brain: No evidence for acute infarction, hemorrhage, mass lesion, hydrocephalus, or extra-axial fluid. Generalized atrophy. Extensive hypoattenuation of white matter representing advanced small vessel disease. Post infusion, no abnormal enhancement of the brain or visible meninges. Vascular: Visible vessels are patent. Skull: No fracture.  No worrisome osseous lesion. Sinuses/Orbits: No acute finding. Other: None. IMPRESSION: Atrophy and small vessel disease. No acute intracranial findings. Similar appearance to prior. Electronically Signed   By: Staci Righter M.D.   On: 10/29/2016 09:32    Assessment/Plan 1. Laceration of arm, left, initial encounter  Placed ~15 sutures in posterior left upper arm  Cover with  Telfa  Wrap in Kerlix  Dry Dressing Change Daily  Augmentin 875-125 mg po Q 12 hours x 7 days  Family/ staff Communication:   Total Time: 85 minutes  Documentation: 20 minutes  Face to  Face: 65 minutes  Family/Phone: daughter was at bedside   Labs/tests ordered:    Medication list reviewed and assessed for continued appropriateness.  Vikki Ports, NP-C Geriatrics [facility] [phone], [place] [address] Cell Phone (Mon-Fri 8am-5pm):  [phone] On Call:  [phone] & follow prompts after 5pm & weekends Office Phone:  [phone] Office Fax:  [phone]

## 2016-11-07 ENCOUNTER — Emergency Department: Payer: Medicare Other

## 2016-11-07 ENCOUNTER — Emergency Department
Admission: EM | Admit: 2016-11-07 | Discharge: 2016-11-07 | Disposition: A | Discharge status: Home / Self Care | Payer: Medicare Other | Attending: Emergency Medicine | Admitting: Emergency Medicine

## 2016-11-07 DIAGNOSIS — S0990XA Unspecified injury of head, initial encounter: Secondary | ICD-10-CM | POA: Diagnosis present

## 2016-11-07 DIAGNOSIS — Y999 Unspecified external cause status: Secondary | ICD-10-CM | POA: Diagnosis not present

## 2016-11-07 DIAGNOSIS — Y92002 Bathroom of unspecified non-institutional (private) residence single-family (private) house as the place of occurrence of the external cause: Secondary | ICD-10-CM | POA: Insufficient documentation

## 2016-11-07 DIAGNOSIS — Z7982 Long term (current) use of aspirin: Secondary | ICD-10-CM | POA: Insufficient documentation

## 2016-11-07 DIAGNOSIS — Y939 Activity, unspecified: Secondary | ICD-10-CM | POA: Insufficient documentation

## 2016-11-07 DIAGNOSIS — S199XXA Unspecified injury of neck, initial encounter: Secondary | ICD-10-CM | POA: Diagnosis not present

## 2016-11-07 DIAGNOSIS — I10 Essential (primary) hypertension: Secondary | ICD-10-CM | POA: Diagnosis not present

## 2016-11-07 DIAGNOSIS — B3789 Other sites of candidiasis: Secondary | ICD-10-CM | POA: Insufficient documentation

## 2016-11-07 DIAGNOSIS — B373 Candidiasis of vulva and vagina: Secondary | ICD-10-CM | POA: Diagnosis not present

## 2016-11-07 DIAGNOSIS — W19XXXA Unspecified fall, initial encounter: Secondary | ICD-10-CM | POA: Insufficient documentation

## 2016-11-07 DIAGNOSIS — Z79899 Other long term (current) drug therapy: Secondary | ICD-10-CM | POA: Diagnosis not present

## 2016-11-07 DIAGNOSIS — S22029A Unspecified fracture of second thoracic vertebra, initial encounter for closed fracture: Secondary | ICD-10-CM | POA: Insufficient documentation

## 2016-11-07 DIAGNOSIS — S299XXA Unspecified injury of thorax, initial encounter: Secondary | ICD-10-CM | POA: Diagnosis not present

## 2016-11-07 DIAGNOSIS — S22028A Other fracture of second thoracic vertebra, initial encounter for closed fracture: Secondary | ICD-10-CM | POA: Diagnosis not present

## 2016-11-07 DIAGNOSIS — S22009A Unspecified fracture of unspecified thoracic vertebra, initial encounter for closed fracture: Secondary | ICD-10-CM

## 2016-11-07 DIAGNOSIS — R220 Localized swelling, mass and lump, head: Secondary | ICD-10-CM | POA: Diagnosis not present

## 2016-11-07 DIAGNOSIS — B3749 Other urogenital candidiasis: Secondary | ICD-10-CM

## 2016-11-07 DIAGNOSIS — R296 Repeated falls: Secondary | ICD-10-CM | POA: Diagnosis not present

## 2016-11-07 DIAGNOSIS — M6281 Muscle weakness (generalized): Secondary | ICD-10-CM | POA: Diagnosis not present

## 2016-11-07 DIAGNOSIS — M545 Low back pain: Secondary | ICD-10-CM | POA: Diagnosis not present

## 2016-11-07 DIAGNOSIS — R41841 Cognitive communication deficit: Secondary | ICD-10-CM | POA: Diagnosis not present

## 2016-11-07 DIAGNOSIS — R262 Difficulty in walking, not elsewhere classified: Secondary | ICD-10-CM | POA: Diagnosis not present

## 2016-11-07 LAB — COMPREHENSIVE METABOLIC PANEL
ALT: 11 U/L — AB (ref 14–54)
AST: 19 U/L (ref 15–41)
Albumin: 3.2 g/dL — ABNORMAL LOW (ref 3.5–5.0)
Alkaline Phosphatase: 57 U/L (ref 38–126)
Anion gap: 6 (ref 5–15)
BUN: 37 mg/dL — AB (ref 6–20)
CHLORIDE: 97 mmol/L — AB (ref 101–111)
CO2: 33 mmol/L — AB (ref 22–32)
CREATININE: 1.18 mg/dL — AB (ref 0.44–1.00)
Calcium: 8.9 mg/dL (ref 8.9–10.3)
GFR calc non Af Amer: 39 mL/min — ABNORMAL LOW (ref >60)
GFR, EST AFRICAN AMERICAN: 46 mL/min — AB (ref >60)
Glucose, Bld: 96 mg/dL (ref 65–99)
POTASSIUM: 4.3 mmol/L (ref 3.5–5.1)
SODIUM: 136 mmol/L (ref 135–145)
Total Bilirubin: 0.7 mg/dL (ref 0.3–1.2)
Total Protein: 6.9 g/dL (ref 6.5–8.1)

## 2016-11-07 LAB — CBC WITH DIFFERENTIAL/PLATELET
BASOS ABS: 0.1 10*3/uL (ref 0–0.1)
Basophils Relative: 1 %
EOS ABS: 0 10*3/uL (ref 0–0.7)
EOS PCT: 1 %
HCT: 36.5 % (ref 35.0–47.0)
Hemoglobin: 12.5 g/dL (ref 12.0–16.0)
Lymphocytes Relative: 12 %
Lymphs Abs: 1 10*3/uL (ref 1.0–3.6)
MCH: 33.8 pg (ref 26.0–34.0)
MCHC: 34.2 g/dL (ref 32.0–36.0)
MCV: 98.7 fL (ref 80.0–100.0)
MONO ABS: 0.8 10*3/uL (ref 0.2–0.9)
Monocytes Relative: 10 %
Neutro Abs: 6.4 10*3/uL (ref 1.4–6.5)
Neutrophils Relative %: 76 %
PLATELETS: 197 10*3/uL (ref 150–440)
RBC: 3.69 MIL/uL — AB (ref 3.80–5.20)
RDW: 13.6 % (ref 11.5–14.5)
WBC: 8.4 10*3/uL (ref 3.6–11.0)

## 2016-11-07 LAB — URINALYSIS, COMPLETE (UACMP) WITH MICROSCOPIC
BACTERIA UA: NONE SEEN
Bilirubin Urine: NEGATIVE
GLUCOSE, UA: NEGATIVE mg/dL
HGB URINE DIPSTICK: NEGATIVE
KETONES UR: 5 mg/dL — AB
Leukocytes, UA: NEGATIVE
NITRITE: NEGATIVE
PROTEIN: NEGATIVE mg/dL
Specific Gravity, Urine: 1.023 (ref 1.005–1.030)
pH: 6 (ref 5.0–8.0)

## 2016-11-07 MED ORDER — NYSTATIN 100000 UNIT/GM EX POWD: Freq: Three times a day (TID) | CUTANEOUS | 0 refills | Status: DC

## 2016-11-07 MED ORDER — ACETAMINOPHEN-CODEINE #3 300-30 MG PO TABS
1.0000 | ORAL_TABLET | Freq: Once | ORAL | Status: AC
  Administered 2016-11-07: 1 via ORAL

## 2016-11-07 MED ORDER — LIDOCAINE 5 % EX PTCH: 1.0000 | MEDICATED_PATCH | Freq: Two times a day (BID) | CUTANEOUS | 0 refills | Status: DC

## 2016-11-07 MED ORDER — ACETAMINOPHEN-CODEINE #3 300-30 MG PO TABS
ORAL_TABLET | ORAL | Status: AC
  Administered 2016-11-07: 1 via ORAL
  Filled 2016-11-07: qty 1

## 2016-11-07 MED ORDER — LIDOCAINE 5 % EX PTCH
1.0000 | MEDICATED_PATCH | CUTANEOUS | Status: DC
  Administered 2016-11-07: 1 via TRANSDERMAL

## 2016-11-07 MED ORDER — LIDOCAINE 5 % EX PTCH
MEDICATED_PATCH | CUTANEOUS | Status: AC
  Administered 2016-11-07: 1 via TRANSDERMAL
  Filled 2016-11-07: qty 1

## 2016-11-07 NOTE — ED Notes (Signed)
Called [facility] and spoke to [place] with nursing. Update given on pt's condition and plan of care. Per EDP, pt is to return to asst living tonight and will be reassessed by PCP who may transfer pt to rehab or other higher level of care tomorrow as necessary. Nursing staff at [facility] stated they will have someone come check on pt every hour tonight. Daughter updated and in agreement with plan.

## 2016-11-07 NOTE — ED Notes (Signed)
Patient transported to CT 

## 2016-11-07 NOTE — ED Notes (Signed)
See triage note. Pt's daughter reports pt given abt 10 days ago for UTI. Yeast present, noted while performing in and out cath.

## 2016-11-07 NOTE — ED Provider Notes (Signed)
[facility] Emergency Department Provider Note  ____________________________________________  Time seen: Approximately 3:08 PM  I have reviewed the triage vital signs and the nursing notes.   HISTORY  Chief Complaint Fall  Level 5 caveat:  Portions of the history and physical were unable to be obtained due to the patient's altered mental status HPI Cindy Estes is a 83 y.o. female with a history of frequent falls spanning over many months but an unwitnessed fall today in the bathroom. Suspect to a falling backward and hit her head. Patient denies any pain or other acute complaints. Daughter reports that the suspect she has dementia but she has not yet completed and evaluation for this. She is being followed by Dr. Frazier Richards at her assisted living facility. She normally walks with a walker but sometimes forgets.     Past Medical History:  Diagnosis Date  . Anxiety unk  . Chronic back pain unk  . Depression unk  . Hypertension   . Scoliosis    born with      Patient Active Problem List   Diagnosis Date Noted  . Hx of falling 10/28/2016  . Memory difficulties 11/06/2015  . Acid reflux 10/31/2015  . Hypertension 06/09/2015  . Scoliosis 06/09/2015  . Cardiac murmur 06/09/2015  . Atypical chest pain 06/09/2015  . Anxiety and depression 06/09/2015  . Urge incontinence 06/09/2015  . Deficiency of other specified B group vitamins (CODE) 06/09/2015  . Lymphocytic colitis 07/08/2011     Past Surgical History:  Procedure Laterality Date  . ABDOMINAL HYSTERECTOMY  1973     Prior to Admission medications   Medication Sig Start Date End Date Taking? Authorizing Provider  acetaminophen-codeine (TYLENOL #3) 300-30 MG tablet TAKE 1 TABLET BY MOUTH EVERY 12 HOURS FOR PAIN 08/20/16  Yes Roselee Nova, MD  ALPRAZolam Duanne Moron) 1 MG tablet 1 mg at bedtime, 0.5 mg q AM PRN Anxiety 08/04/16  Yes Roselee Nova, MD  aspirin EC 81 MG tablet Take by  mouth.   Yes Historical Provider, MD  Cyanocobalamin (B-12) 1000 MCG/ML KIT Inject 1,000 mcg as directed every 30 (thirty) days. Every first of the month.   Yes Historical Provider, MD  divalproex (DEPAKOTE SPRINKLE) 125 MG capsule Take 250 mg by mouth 2 (two) times daily.   Yes Historical Provider, MD  valsartan-hydrochlorothiazide (DIOVAN-HCT) 160-12.5 MG tablet Take 1 tablet by mouth every morning. 05/03/16  Yes Roselee Nova, MD  venlafaxine XR (EFFEXOR-XR) 75 MG 24 hr capsule Take 1 capsule (75 mg total) by mouth at bedtime. Patient taking differently: Take 150 mg by mouth at bedtime.  05/03/16  Yes Roselee Nova, MD  benzonatate (TESSALON) 200 MG capsule Take 1 capsule (200 mg total) by mouth 2 (two) times daily as needed for cough. Patient not taking: Reported on 09/09/2016 08/04/16   Roselee Nova, MD     Allergies Patient has no known allergies.   Family History  Problem Relation Age of Onset  . Dementia Father   . Heart disease Maternal Grandmother   . Diabetes Maternal Grandfather   . Other Mother     Died during childbirth  . Heart disease Maternal Uncle     Social History Social History  Substance Use Topics  . Smoking status: Never Smoker  . Smokeless tobacco: Never Used  . Alcohol use No    Review of Systems Unable to obtain due to altered mental status  ____________________________________________  PHYSICAL EXAM:  VITAL SIGNS: ED Triage Vitals  Enc Vitals Group     BP 11/07/16 1204 124/74     Pulse Rate 11/07/16 1204 91     Resp 11/07/16 1204 16     Temp 11/07/16 1204 97.7 F (36.5 C)     Temp Source 11/07/16 1204 Oral     SpO2 11/07/16 1204 95 %     Weight 11/07/16 1206 158 lb 2 oz (71.7 kg)     Height 11/07/16 1206 '4\' 10"'  (1.473 m)     Head Circumference --      Peak Flow --      Pain Score --      Pain Loc --      Pain Edu? --      Excl. in [place]? --     Vital signs reviewed, nursing assessments reviewed.   Constitutional:   Alert  and orientedTo self. Well appearing and in no distress. Eyes:   No scleral icterus. No conjunctival pallor. PERRL. EOMI.  No nystagmus. ENT   Head:   Normocephalic and atraumatic.   Nose:   No congestion/rhinnorhea. No septal hematoma   Mouth/Throat:   MMM, no pharyngeal erythema. No peritonsillar mass.    Neck:   No stridor. No SubQ emphysema. No meningismus. Hematological/Lymphatic/Immunilogical:   No cervical lymphadenopathy. Cardiovascular:   RRR. Symmetric bilateral radial and DP pulses.  No murmurs.  Respiratory:   Normal respiratory effort without tachypnea nor retractions. Breath sounds are clear and equal bilaterally. No wheezes/rales/rhonchi. Gastrointestinal:   Soft and nontender. Non distended. There is no CVA tenderness.  No rebound, rigidity, or guarding. Genitourinary:   deferred Musculoskeletal:   Normal range of motion in all extremities. No joint effusions.  No lower extremity tenderness.  No edema. No midline spinal tenderness Neurologic:   Normal speech and language.  CN 2-10 normal. Motor grossly intact. No gross focal neurologic deficits are appreciated.  Skin:    Skin is warm, dry and intact. No rash noted.  No petechiae, purpura, or bullae.  ____________________________________________    LABS (pertinent positives/negatives) (all labs ordered are listed, but only abnormal results are displayed) Labs Reviewed  CBC WITH DIFFERENTIAL/PLATELET - Abnormal; Notable for the following:       Result Value   RBC 3.69 (*)    All other components within normal limits  COMPREHENSIVE METABOLIC PANEL - Abnormal; Notable for the following:    Chloride 97 (*)    CO2 33 (*)    BUN 37 (*)    Creatinine, Ser 1.18 (*)    Albumin 3.2 (*)    ALT 11 (*)    GFR calc non Af Amer 39 (*)    GFR calc Af Amer 46 (*)    All other components within normal limits  URINALYSIS, COMPLETE (UACMP) WITH MICROSCOPIC - Abnormal; Notable for the following:    Color, Urine YELLOW  (*)    APPearance CLEAR (*)    Ketones, ur 5 (*)    Squamous Epithelial / LPF 0-5 (*)    All other components within normal limits   ____________________________________________   EKG    ____________________________________________    RADIOLOGY  Dg Chest 1 View  Result Date: 11/07/2016 CLINICAL DATA:  Un- witnessed fall EXAM: CHEST 1 VIEW COMPARISON:  11/26/2015 FINDINGS: Cardiomediastinal silhouette is stable. Dextroscoliosis lower thoracic spine. Again noted soft tissue prominence in upper mediastinum right paratracheal region probable goiter. Extensive degenerative changes right shoulder are stable. No infiltrate or pulmonary edema. No  gross fractures are identified. No pneumothorax. IMPRESSION: Dextroscoliosis lower thoracic spine. Again noted soft tissue prominence in upper mediastinum right paratracheal region probable goiter. Extensive degenerative changes right shoulder are stable. No infiltrate or pulmonary edema. No gross fractures are identified. No pneumothorax. Electronically Signed   By: Lahoma Crocker M.D.   On: 11/07/2016 13:47   Ct Head Wo Contrast  Result Date: 11/07/2016 CLINICAL DATA:  83 year old female with multiple recent falls and head and neck injuries. Initial encounter. EXAM: CT HEAD WITHOUT CONTRAST CT CERVICAL SPINE WITHOUT CONTRAST TECHNIQUE: Multidetector CT imaging of the head and cervical spine was performed following the standard protocol without intravenous contrast. Multiplanar CT image reconstructions of the cervical spine were also generated. COMPARISON:  10/29/2016 and prior CTs FINDINGS: CT HEAD FINDINGS Brain: No evidence of acute infarction, hemorrhage, hydrocephalus, extra-axial collection or mass lesion/mass effect. Atrophy and chronic small-vessel white matter ischemic changes again noted. Vascular: Mild intracranial atherosclerotic calcifications noted. Skull: Normal. Negative for fracture or focal lesion. Sinuses/Orbits: No acute finding. Other: Mild  right scalp soft tissue swelling noted. CT CERVICAL SPINE FINDINGS Alignment: No subluxation. Skull base and vertebrae: A nondisplaced fracture of the T2 left transverse process is noted. No other fracture identified. Soft tissues and spinal canal: No prevertebral fluid or swelling. No visible canal hematoma. Disc levels: Fusion changes identified. Facet arthropathy and degenerative disc disease noted. Upper chest: No acute abnormality. Other: Thyroid goiter noted. IMPRESSION: No evidence of acute intracranial abnormality. Mild right scalp soft tissue swelling. Nondisplaced fracture of the T2 left transverse process. Atrophy and chronic small-vessel white matter ischemic changes. Electronically Signed   By: Margarette Canada M.D.   On: 11/07/2016 14:04   Ct Cervical Spine Wo Contrast  Result Date: 11/07/2016 CLINICAL DATA:  83 year old female with multiple recent falls and head and neck injuries. Initial encounter. EXAM: CT HEAD WITHOUT CONTRAST CT CERVICAL SPINE WITHOUT CONTRAST TECHNIQUE: Multidetector CT imaging of the head and cervical spine was performed following the standard protocol without intravenous contrast. Multiplanar CT image reconstructions of the cervical spine were also generated. COMPARISON:  10/29/2016 and prior CTs FINDINGS: CT HEAD FINDINGS Brain: No evidence of acute infarction, hemorrhage, hydrocephalus, extra-axial collection or mass lesion/mass effect. Atrophy and chronic small-vessel white matter ischemic changes again noted. Vascular: Mild intracranial atherosclerotic calcifications noted. Skull: Normal. Negative for fracture or focal lesion. Sinuses/Orbits: No acute finding. Other: Mild right scalp soft tissue swelling noted. CT CERVICAL SPINE FINDINGS Alignment: No subluxation. Skull base and vertebrae: A nondisplaced fracture of the T2 left transverse process is noted. No other fracture identified. Soft tissues and spinal canal: No prevertebral fluid or swelling. No visible canal  hematoma. Disc levels: Fusion changes identified. Facet arthropathy and degenerative disc disease noted. Upper chest: No acute abnormality. Other: Thyroid goiter noted. IMPRESSION: No evidence of acute intracranial abnormality. Mild right scalp soft tissue swelling. Nondisplaced fracture of the T2 left transverse process. Atrophy and chronic small-vessel white matter ischemic changes. Electronically Signed   By: Margarette Canada M.D.   On: 11/07/2016 14:04    ____________________________________________   PROCEDURES Procedures  ____________________________________________   INITIAL IMPRESSION / ASSESSMENT AND PLAN / ED COURSE  Pertinent labs & imaging results that were available during my care of the patient were reviewed by me and considered in my medical decision making (see chart for details).  Patient well appearing no acute distress, presents with unwitnessed fall. No evidence of trauma on exam currently. We'll obtain CT head and neck a screening measure due to  uncertainty in her presentation. Exam is otherwise unremarkable and patient is likely suitable for outpatient management if workup is negative.    ----------------------------------------- 3:11 PM on 11/07/2016 -----------------------------------------  Initial imaging shows an isolated transverse process fracture of T2. As these can be associated with other occult injuries, we'll get a CT scan of the thoracic spine. Afterward patient should be suitable for outpatient management if there has severe findings. Pain is under control. She is being evaluated by primary care for her chronic falls and confusion.         ____________________________________________   FINAL CLINICAL IMPRESSION(S) / ED DIAGNOSES  Final diagnoses:  Fall, initial encounter  Fracture of thoracic transverse process, closed, initial encounter [facility])      New Prescriptions   No medications on file     Portions of this note were generated with  dragon dictation software. Dictation errors may occur despite best attempts at proofreading.    Carrie Mew, MD 11/07/16 [phone]

## 2016-11-07 NOTE — ED Provider Notes (Signed)
Patient received in sign-out from Dr. Joni Fears.  Workup and evaluation pending CT imaging of thoracic spine.  Patient with isolated injury to the transverse process. No other associated traumatic injuries. Patient was able to ambulate and get in the chair. Does have some mild discomfort overlying fracture but in no acute distress. Patient is presenting from assisted living facility and does have physical therapy available at this facility with occupational therapy in case management. His point do not feel the patient requires admission to the hospital and is stable for discharge back to her facility.  Have discussed with the patient and available family all diagnostics and treatments performed thus far and all questions were answered to the best of my ability. The patient demonstrates understanding and agreement with plan.       Merlyn Lot, MD 11/07/16 1640

## 2016-11-07 NOTE — ED Notes (Signed)
Patient's discharge and follow up information reviewed with patient and daughter [facility]) by ED nursing staff and patient given the opportunity to ask questions pertaining to ED visit and discharge plan of care. Patient advised that should symptoms not continue to improve, resolve entirely, or should new symptoms develop then a follow up visit with their PCP or a return visit to the ED may be warranted. Patient verbalized consent and understanding of discharge plan of care including potential need for further evaluation. Patient discharged in stable condition per attending ED physician on duty.

## 2016-11-07 NOTE — ED Notes (Signed)
I helped the patient walk to the restroom without any issues. Very little help more just for stabilization. Family member also helped.

## 2016-11-07 NOTE — ED Notes (Signed)
Pulse ox site changed to R earlobe. O2 sat 96% RA.

## 2016-11-07 NOTE — ED Triage Notes (Signed)
Pt found on the bathroom floor - hx of multiple falls (3 in the last week).  Poor historian with daughter stating pt's memory much worse in the last few months.

## 2016-11-07 NOTE — Discharge Instructions (Addendum)
Patient will need PT and OT consultation and evaluation starting tomorrow.

## 2016-11-08 DIAGNOSIS — M6281 Muscle weakness (generalized): Secondary | ICD-10-CM | POA: Diagnosis not present

## 2016-11-08 DIAGNOSIS — R41841 Cognitive communication deficit: Secondary | ICD-10-CM | POA: Diagnosis not present

## 2016-11-08 DIAGNOSIS — R262 Difficulty in walking, not elsewhere classified: Secondary | ICD-10-CM | POA: Diagnosis not present

## 2016-11-09 DIAGNOSIS — F339 Major depressive disorder, recurrent, unspecified: Secondary | ICD-10-CM | POA: Diagnosis not present

## 2016-11-09 DIAGNOSIS — G301 Alzheimer's disease with late onset: Secondary | ICD-10-CM | POA: Diagnosis not present

## 2016-11-09 DIAGNOSIS — F0280 Dementia in other diseases classified elsewhere without behavioral disturbance: Secondary | ICD-10-CM | POA: Diagnosis not present

## 2016-11-09 DIAGNOSIS — R262 Difficulty in walking, not elsewhere classified: Secondary | ICD-10-CM | POA: Diagnosis not present

## 2016-11-09 DIAGNOSIS — M6281 Muscle weakness (generalized): Secondary | ICD-10-CM | POA: Diagnosis not present

## 2016-11-09 DIAGNOSIS — F39 Unspecified mood [affective] disorder: Secondary | ICD-10-CM | POA: Diagnosis not present

## 2016-11-09 DIAGNOSIS — R41841 Cognitive communication deficit: Secondary | ICD-10-CM | POA: Diagnosis not present

## 2016-11-10 DIAGNOSIS — M6281 Muscle weakness (generalized): Secondary | ICD-10-CM | POA: Diagnosis not present

## 2016-11-10 DIAGNOSIS — R262 Difficulty in walking, not elsewhere classified: Secondary | ICD-10-CM | POA: Diagnosis not present

## 2016-11-10 DIAGNOSIS — R41841 Cognitive communication deficit: Secondary | ICD-10-CM | POA: Diagnosis not present

## 2016-11-11 DIAGNOSIS — M6281 Muscle weakness (generalized): Secondary | ICD-10-CM | POA: Diagnosis not present

## 2016-11-11 DIAGNOSIS — R41841 Cognitive communication deficit: Secondary | ICD-10-CM | POA: Diagnosis not present

## 2016-11-11 DIAGNOSIS — R262 Difficulty in walking, not elsewhere classified: Secondary | ICD-10-CM | POA: Diagnosis not present

## 2016-11-12 DIAGNOSIS — R262 Difficulty in walking, not elsewhere classified: Secondary | ICD-10-CM | POA: Diagnosis not present

## 2016-11-12 DIAGNOSIS — R41841 Cognitive communication deficit: Secondary | ICD-10-CM | POA: Diagnosis not present

## 2016-11-12 DIAGNOSIS — M6281 Muscle weakness (generalized): Secondary | ICD-10-CM | POA: Diagnosis not present

## 2016-11-15 DIAGNOSIS — R41841 Cognitive communication deficit: Secondary | ICD-10-CM | POA: Diagnosis not present

## 2016-11-15 DIAGNOSIS — M6281 Muscle weakness (generalized): Secondary | ICD-10-CM | POA: Diagnosis not present

## 2016-11-15 DIAGNOSIS — R262 Difficulty in walking, not elsewhere classified: Secondary | ICD-10-CM | POA: Diagnosis not present

## 2016-11-16 DIAGNOSIS — M6281 Muscle weakness (generalized): Secondary | ICD-10-CM | POA: Diagnosis not present

## 2016-11-16 DIAGNOSIS — R41841 Cognitive communication deficit: Secondary | ICD-10-CM | POA: Diagnosis not present

## 2016-11-16 DIAGNOSIS — R262 Difficulty in walking, not elsewhere classified: Secondary | ICD-10-CM | POA: Diagnosis not present

## 2016-11-17 DIAGNOSIS — R41841 Cognitive communication deficit: Secondary | ICD-10-CM | POA: Diagnosis not present

## 2016-11-17 DIAGNOSIS — R262 Difficulty in walking, not elsewhere classified: Secondary | ICD-10-CM | POA: Diagnosis not present

## 2016-11-17 DIAGNOSIS — M6281 Muscle weakness (generalized): Secondary | ICD-10-CM | POA: Diagnosis not present

## 2016-11-23 ENCOUNTER — Non-Acute Institutional Stay: Payer: Medicare Other | Admitting: Gerontology

## 2016-11-23 ENCOUNTER — Encounter
Admission: RE | Admit: 2016-11-23 | Discharge: 2016-11-23 | Disposition: A | Discharge status: Home / Self Care | Payer: Medicare Other | Source: Ambulatory Visit | Attending: Internal Medicine | Admitting: Internal Medicine

## 2016-11-23 DIAGNOSIS — W19XXXA Unspecified fall, initial encounter: Secondary | ICD-10-CM | POA: Diagnosis not present

## 2016-11-23 DIAGNOSIS — S01312A Laceration without foreign body of left ear, initial encounter: Secondary | ICD-10-CM

## 2016-11-23 DIAGNOSIS — F339 Major depressive disorder, recurrent, unspecified: Secondary | ICD-10-CM | POA: Diagnosis not present

## 2016-11-23 DIAGNOSIS — G301 Alzheimer's disease with late onset: Secondary | ICD-10-CM | POA: Diagnosis not present

## 2016-11-23 DIAGNOSIS — F0280 Dementia in other diseases classified elsewhere without behavioral disturbance: Secondary | ICD-10-CM | POA: Diagnosis not present

## 2016-11-23 DIAGNOSIS — F39 Unspecified mood [affective] disorder: Secondary | ICD-10-CM | POA: Diagnosis not present

## 2016-11-25 ENCOUNTER — Encounter: Payer: Self-pay | Admitting: Gerontology

## 2016-11-25 DIAGNOSIS — G301 Alzheimer's disease with late onset: Secondary | ICD-10-CM

## 2016-11-25 DIAGNOSIS — F0390 Unspecified dementia without behavioral disturbance: Secondary | ICD-10-CM | POA: Insufficient documentation

## 2016-11-25 DIAGNOSIS — F0280 Dementia in other diseases classified elsewhere without behavioral disturbance: Secondary | ICD-10-CM | POA: Insufficient documentation

## 2016-12-08 NOTE — Progress Notes (Signed)
Location:      Place of Service:  ALF (13) Provider:  Toni Arthurs, NP-C  Roselee Nova, MD  Patient Care Team: Roselee Nova, MD as PCP - General [facility])  Extended Emergency Contact Information Primary Emergency Contact: Kleinman,[name] Address: [address]          [place],  [address] [place] Phone: [phone] Relation: Daughter  Code Status:  DNR Goals of care: Advanced Directive information Advanced Directives 11/07/2016  Does Patient Have a Medical Advance Directive? Yes  Type of Paramedic of [place];Living will;Out of facility DNR (pink MOST or yellow form)  Does patient want to make changes to medical advance directive? -  Copy of [place] in Chart? -  Would patient like information on creating a medical advance directive? -     Chief Complaint  Patient presents with  . Acute Visit    HPI:  Pt is a 83 y.o. female seen today for an acute visit for fall. Pt was walking to the bathroom without her walker. Lost her balance and fell to the floor. She hit her left ear on the corner of a table. Pt without complaints of extremeties. Full ROM. She was able to stand back up with minimal staff assistance and walk back to bed. Pt does have an ~2.5-3 cm laceration on the auricle of the left ear. Slow bleed. Edges clean and closely approximated.Pt reports the area is "tender" but not painful. Nursing cleaned the area with NS and applied Steri-strips. Pt tolerated well. Tetnus vaccine is up to date. VSS. No other complaints.   Past Medical History:  Diagnosis Date  . Anxiety unk  . Chronic back pain unk  . Depression unk  . Hypertension   . Scoliosis    born with    Past Surgical History:  Procedure Laterality Date  . ABDOMINAL HYSTERECTOMY  1973    No Known Allergies  Allergies as of 11/23/2016   No Known Allergies     Medication List       Accurate as of 11/23/16 11:59 PM.  Always use your most recent med list.          acetaminophen-codeine 300-30 MG tablet Commonly known as:  TYLENOL #3 TAKE 1 TABLET BY MOUTH EVERY 12 HOURS FOR PAIN   ALPRAZolam 1 MG tablet Commonly known as:  XANAX 1 mg at bedtime, 0.5 mg q AM PRN Anxiety   aspirin EC 81 MG tablet Take by mouth.   B-12 1000 MCG/ML Kit Inject 1,000 mcg as directed every 30 (thirty) days. Every first of the month.   benzonatate 200 MG capsule Commonly known as:  TESSALON Take 1 capsule (200 mg total) by mouth 2 (two) times daily as needed for cough.   divalproex 125 MG capsule Commonly known as:  DEPAKOTE SPRINKLE Take 250 mg by mouth 2 (two) times daily.   lidocaine 5 % Commonly known as:  LIDODERM Place 1 patch onto the skin every 12 (twelve) hours. Remove & Discard patch within 12 hours or as directed by MD   nystatin powder Commonly known as:  MYCOSTATIN/NYSTOP Apply topically 3 (three) times daily. Apply to affected area in the groin   valsartan-hydrochlorothiazide 160-12.5 MG tablet Commonly known as:  DIOVAN-HCT Take 1 tablet by mouth every morning.   venlafaxine XR 75 MG 24 hr capsule Commonly known as:  EFFEXOR-XR Take 1 capsule (75 mg total) by mouth at bedtime.  Review of Systems  Constitutional: Negative for activity change, appetite change, chills, diaphoresis and fever.  HENT: Negative.   Respiratory: Negative.   Cardiovascular: Negative.   Gastrointestinal: Negative.   Genitourinary: Negative.   Musculoskeletal: Positive for arthralgias (typical arthritis) and back pain. Negative for gait problem and myalgias.  Skin: Positive for wound. Negative for color change, pallor and rash.  Neurological: Positive for weakness and numbness (with the Xylocaine). Negative for dizziness, tremors, syncope, speech difficulty, light-headedness and headaches.  Psychiatric/Behavioral: Positive for agitation and confusion. Negative for behavioral problems. The patient is  nervous/anxious.   All other systems reviewed and are negative.   Immunization History  Administered Date(s) Administered  . Influenza-Unspecified 04/25/2014, 04/26/2015, 04/26/2016  . Tdap 07/20/2015   Pertinent  Health Maintenance Due  Topic Date Due  . DEXA SCAN  04/07/1991  . PNA vac Low Risk Adult (1 of 2 - PCV13) 04/07/1991  . INFLUENZA VACCINE  02/23/2017   Fall Risk  08/04/2016 06/02/2016 01/29/2016 12/09/2015 09/11/2015  Falls in the past year? Yes No No No Yes  Number falls in past yr: 1 - - - 1  Injury with Fall? No - - - Yes  Risk Factor Category  - - - - -  Risk for fall due to : - - - - Other (Comment)  Risk for fall due to (comments): - - - - hit head on toilet when she fell  Follow up Falls evaluation completed - - - Falls evaluation completed   Functional Status Survey:    Vitals:   10/27/16 1540  BP: (!) 125/52  Pulse: 65  Resp: (!) 96  Temp: 97.4 F (36.3 C)   There is no height or weight on file to calculate BMI. Physical Exam  Constitutional: She is oriented to person, place, and time. Vital signs are normal. She appears well-developed and well-nourished. She is active and cooperative. She does not appear ill. No distress.  HENT:  Head: Normocephalic and atraumatic.  Right Ear: Decreased hearing is noted.  Left Ear: Left ear exhibits lacerations. Decreased hearing is noted.  Ears:  Mouth/Throat: Uvula is midline, oropharynx is clear and moist and mucous membranes are normal. Mucous membranes are not pale, not dry and not cyanotic.  Eyes: Conjunctivae, EOM and lids are normal. Pupils are equal, round, and reactive to light.  Neck: Trachea normal, normal range of motion and full passive range of motion without pain. Neck supple. No JVD present. No tracheal deviation, no edema and no erythema present. No thyromegaly present.  Cardiovascular: Normal rate, regular rhythm, normal heart sounds, intact distal pulses and normal pulses.  Exam reveals no gallop, no  distant heart sounds and no friction rub.   No murmur heard. Pulses:      Radial pulses are 2+ on the right side, and 2+ on the left side.  Pulmonary/Chest: Effort normal and breath sounds normal. No accessory muscle usage. No respiratory distress. She has no decreased breath sounds. She has no wheezes. She has no rhonchi. She has no rales. She exhibits no tenderness.  Abdominal: Normal appearance and bowel sounds are normal. She exhibits no distension and no ascites. There is no tenderness.  Musculoskeletal: Normal range of motion. She exhibits no edema or tenderness.  Expected osteoarthritis, stiffness  Neurological: She is alert and oriented to person, place, and time. She has normal strength.  Skin: Skin is warm and dry. Laceration noted. No abrasion and no rash noted. She is not diaphoretic. No cyanosis or erythema. No  pallor. Nails show no clubbing.     Psychiatric: Her speech is normal and behavior is normal. Judgment and thought content normal. Her mood appears anxious. Cognition and memory are impaired. She exhibits abnormal recent memory.  Nursing note and vitals reviewed.   Labs reviewed:  Recent Labs  09/09/16 1930 10/19/16 1525 10/29/16 0848 11/07/16 1228  NA 137 136  --  136  K 4.3 4.0  --  4.3  CL 100* 102  --  97*  CO2 29 25  --  33*  GLUCOSE 103* 94  --  96  BUN 31* 43*  --  37*  CREATININE 0.85 1.38* 1.00 1.18*  CALCIUM 9.3 8.9  --  8.9  MG  --  2.2  --   --     Recent Labs  09/09/16 1930 10/19/16 1525 11/07/16 1228  AST '23 22 19  ' ALT 12* 13* 11*  ALKPHOS 54 52 57  BILITOT 0.7 0.6 0.7  PROT 7.2 7.0 6.9  ALBUMIN 3.7 3.6 3.2*    Recent Labs  09/09/16 1930 10/19/16 1525 11/07/16 1228  WBC 8.8 8.5 8.4  NEUTROABS 5.9 5.5 6.4  HGB 13.0 13.5 12.5  HCT 38.7 39.6 36.5  MCV 98.8 98.9 98.7  PLT 257 318 197   Lab Results  Component Value Date   TSH 0.667 10/19/2016   No results found for: HGBA1C Lab Results  Component Value Date   CHOL 198  10/19/2016   HDL 60 10/19/2016   LDLCALC 123 (H) 10/19/2016   TRIG 74 10/19/2016   CHOLHDL 3.3 10/19/2016    Significant Diagnostic Results in last 30 days:  No results found.  Assessment/Plan 1. Laceration of auricle of left ear, initial encounter  Apply steri-strips  Cover with dry dressing, change daily  2. Fall, initial encounter  Safety precautions  Family/ staff Communication:   Total Time:  Documentation:  Face to Face:  Family/Phone:   Labs/tests ordered:    Medication list reviewed and assessed for continued appropriateness.  Vikki Ports, NP-C Geriatrics [facility] [phone], [place] [address] Cell Phone (Mon-Fri 8am-5pm):  [phone] On Call:  [phone] & follow prompts after 5pm & weekends Office Phone:  [phone] Office Fax:  [phone]

## 2016-12-22 DIAGNOSIS — F39 Unspecified mood [affective] disorder: Secondary | ICD-10-CM | POA: Diagnosis not present

## 2016-12-22 DIAGNOSIS — R296 Repeated falls: Secondary | ICD-10-CM | POA: Insufficient documentation

## 2016-12-22 DIAGNOSIS — R413 Other amnesia: Secondary | ICD-10-CM | POA: Diagnosis not present

## 2016-12-22 DIAGNOSIS — F0390 Unspecified dementia without behavioral disturbance: Secondary | ICD-10-CM | POA: Diagnosis not present

## 2016-12-24 ENCOUNTER — Encounter
Admission: RE | Admit: 2016-12-24 | Discharge: 2016-12-24 | Disposition: A | Discharge status: Home / Self Care | Payer: Medicare Other | Source: Ambulatory Visit | Attending: Internal Medicine | Admitting: Internal Medicine

## 2016-12-27 ENCOUNTER — Ambulatory Visit (INDEPENDENT_AMBULATORY_CARE_PROVIDER_SITE_OTHER): Payer: Medicare Other | Admitting: Podiatry

## 2016-12-27 ENCOUNTER — Encounter: Payer: Self-pay | Admitting: Podiatry

## 2016-12-27 DIAGNOSIS — I739 Peripheral vascular disease, unspecified: Secondary | ICD-10-CM

## 2016-12-27 DIAGNOSIS — M79609 Pain in unspecified limb: Secondary | ICD-10-CM

## 2016-12-27 DIAGNOSIS — B351 Tinea unguium: Secondary | ICD-10-CM | POA: Diagnosis not present

## 2016-12-27 NOTE — Progress Notes (Signed)
Complaint:  Visit Type: Patient returns to my office for continued preventative foot care services. Complaint: Patient states" my nails have grown long and thick and become painful to walk and wear shoes" . The patient presents for preventative foot care services. No changes to ROS  Podiatric Exam: Vascular: dorsalis pedis and posterior tibial pulses are palpable bilateral. Capillary return is immediate. Temperature gradient is WNL. Skin turgor WNL  Sensorium: Normal Semmes Weinstein monofilament test. Normal tactile sensation bilaterally. Nail Exam: Pt has thick disfigured discolored nails with subungual debris noted bilateral entire nail hallux through fifth toenails Ulcer Exam: There is no evidence of ulcer or pre-ulcerative changes or infection. Orthopedic Exam: Muscle tone and strength are WNL. No limitations in general ROM. No crepitus or effusions noted. Foot type and digits show no abnormalities. Bony prominences are unremarkable. Skin: No Porokeratosis. No infection or ulcers  Diagnosis:  Onychomycosis, , Pain in right toe, pain in left toes  Treatment & Plan Procedures and Treatment: Consent by patient was obtained for treatment procedures. The patient understood the discussion of treatment and procedures well. All questions were answered thoroughly reviewed. Debridement of mycotic and hypertrophic toenails, 1 through 5 bilateral and clearing of subungual debris. No ulceration, no infection noted.  Return Visit-Office Procedure: Patient instructed to return to the office for a follow up visit 10 weeks  for continued evaluation and treatment.    Milta Croson DPM 

## 2016-12-29 ENCOUNTER — Non-Acute Institutional Stay: Payer: Medicare Other | Admitting: Gerontology

## 2016-12-29 DIAGNOSIS — M419 Scoliosis, unspecified: Secondary | ICD-10-CM

## 2016-12-29 DIAGNOSIS — E538 Deficiency of other specified B group vitamins: Secondary | ICD-10-CM

## 2016-12-29 DIAGNOSIS — F329 Major depressive disorder, single episode, unspecified: Secondary | ICD-10-CM | POA: Diagnosis not present

## 2016-12-29 DIAGNOSIS — K219 Gastro-esophageal reflux disease without esophagitis: Secondary | ICD-10-CM

## 2016-12-29 DIAGNOSIS — I10 Essential (primary) hypertension: Secondary | ICD-10-CM

## 2016-12-29 DIAGNOSIS — F39 Unspecified mood [affective] disorder: Secondary | ICD-10-CM | POA: Diagnosis not present

## 2016-12-29 DIAGNOSIS — Z9181 History of falling: Secondary | ICD-10-CM

## 2016-12-29 DIAGNOSIS — F0280 Dementia in other diseases classified elsewhere without behavioral disturbance: Secondary | ICD-10-CM | POA: Diagnosis not present

## 2016-12-29 DIAGNOSIS — G301 Alzheimer's disease with late onset: Secondary | ICD-10-CM

## 2016-12-29 DIAGNOSIS — F419 Anxiety disorder, unspecified: Secondary | ICD-10-CM

## 2017-01-16 DIAGNOSIS — F329 Major depressive disorder, single episode, unspecified: Secondary | ICD-10-CM | POA: Insufficient documentation

## 2017-01-16 DIAGNOSIS — F39 Unspecified mood [affective] disorder: Secondary | ICD-10-CM | POA: Insufficient documentation

## 2017-01-16 NOTE — Progress Notes (Signed)
Location:      Place of Service:  ALF (13) Provider:  Toni Arthurs, NP-C  Roselee Nova, MD  Patient Care Team: Roselee Nova, MD as PCP - General [facility])  Extended Emergency Contact Information Primary Emergency Contact: Tennis,[name] Address: [address]          [place], [place] [address] [place] Phone: [phone] Relation: Daughter  Code Status:  DNR Goals of care: Advanced Directive information Advanced Directives 11/07/2016  Does Patient Have a Medical Advance Directive? Yes  Type of Paramedic of [place];Living will;Out of facility DNR (pink MOST or yellow form)  Does patient want to make changes to medical advance directive? -  Copy of [place] in Chart? -  Would patient like information on creating a medical advance directive? -     Chief Complaint  Patient presents with  . Medical Management of Chronic Issues    HPI:  Pt is a 83 y.o. female seen today for medical management of chronic diseases. Pt has been stable over the past month. Still has intermittent crying episodes. Pt went to see Dr Melrose Nakayama for the dementia. Dr Melrose Nakayama made medication adjustments. However, after the adjustments, pt quickly began having worsening crying and anxiety episode with behaviors. I adjusted medication regimen back to previous doses. Pt's mood has improved. No reported falls this month. Pt is interacting with others and participating in group activities. She's smiling and seems more relaxed or settled. Pt is eating well. Having regular BMs. Ambulates with rolator. Palliative Care following pt. [facility] also following pt. VSS. No other complaints.    Past Medical History:  Diagnosis Date  . Anxiety unk  . Chronic back pain unk  . Depression unk  . Hypertension   . Scoliosis    born with    Past Surgical History:  Procedure Laterality Date  . ABDOMINAL HYSTERECTOMY  1973    No Known  Allergies  Allergies as of 12/29/2016   No Known Allergies     Medication List       Accurate as of 12/29/16 11:59 PM. Always use your most recent med list.          acetaminophen-codeine 300-30 MG tablet Commonly known as:  TYLENOL #3 TAKE 1 TABLET BY MOUTH EVERY 12 HOURS FOR PAIN   ALPRAZolam 1 MG tablet Commonly known as:  XANAX 1 mg at bedtime, 0.5 mg q AM PRN Anxiety   aspirin EC 81 MG tablet Take by mouth.   B-12 1000 MCG/ML Kit Inject 1,000 mcg as directed every 30 (thirty) days. Every first of the month.   benzonatate 200 MG capsule Commonly known as:  TESSALON Take 1 capsule (200 mg total) by mouth 2 (two) times daily as needed for cough.   divalproex 125 MG capsule Commonly known as:  DEPAKOTE SPRINKLE Take 250 mg by mouth 2 (two) times daily.   lidocaine 5 % Commonly known as:  LIDODERM Place 1 patch onto the skin every 12 (twelve) hours. Remove & Discard patch within 12 hours or as directed by MD   nystatin powder Commonly known as:  MYCOSTATIN/NYSTOP Apply topically 3 (three) times daily. Apply to affected area in the groin   valsartan-hydrochlorothiazide 160-12.5 MG tablet Commonly known as:  DIOVAN-HCT Take 1 tablet by mouth every morning.   venlafaxine XR 75 MG 24 hr capsule Commonly known as:  EFFEXOR-XR Take 1 capsule (75 mg total) by mouth at bedtime.  Review of Systems  Constitutional: Negative for activity change, appetite change, chills, diaphoresis and fever.  HENT: Negative for congestion, sneezing, sore throat, trouble swallowing and voice change.   Respiratory: Negative for apnea, cough, choking, chest tightness, shortness of breath and wheezing.   Cardiovascular: Negative for chest pain, palpitations and leg swelling.  Gastrointestinal: Negative for abdominal distention, abdominal pain, constipation, diarrhea and nausea.  Genitourinary: Negative for difficulty urinating, dysuria, frequency and urgency.  Musculoskeletal:  Positive for arthralgias (typical arthritis) and back pain. Negative for gait problem and myalgias.  Skin: Negative for color change, pallor, rash and wound.  Neurological: Negative for dizziness, tremors, syncope, speech difficulty, weakness, numbness and headaches.  Psychiatric/Behavioral: Negative for agitation and behavioral problems.  All other systems reviewed and are negative.   Immunization History  Administered Date(s) Administered  . Influenza-Unspecified 04/25/2014, 04/26/2015, 04/26/2016  . Tdap 07/20/2015   Pertinent  Health Maintenance Due  Topic Date Due  . DEXA SCAN  04/07/1991  . PNA vac Low Risk Adult (1 of 2 - PCV13) 04/07/1991  . INFLUENZA VACCINE  02/23/2017   Fall Risk  08/04/2016 06/02/2016 01/29/2016 12/09/2015 09/11/2015  Falls in the past year? Yes No No No Yes  Number falls in past yr: 1 - - - 1  Injury with Fall? No - - - Yes  Risk Factor Category  - - - - -  Risk for fall due to : - - - - Other (Comment)  Risk for fall due to (comments): - - - - hit head on toilet when she fell  Follow up Falls evaluation completed - - - Falls evaluation completed   Functional Status Survey:    Vitals:   11/24/16 0730  BP: (!) 149/88  Pulse: (!) 102  Resp: 20  Temp: 97.4 F (36.3 C)  SpO2: 98%  Weight: 148 lb 3.2 oz (67.2 kg)   Body mass index is 30.97 kg/m. Physical Exam  Constitutional: She is oriented to person, place, and time. Vital signs are normal. She appears well-developed and well-nourished. She is active and cooperative. She does not appear ill. No distress.  HENT:  Head: Normocephalic and atraumatic.  Mouth/Throat: Uvula is midline, oropharynx is clear and moist and mucous membranes are normal. Mucous membranes are not pale, not dry and not cyanotic.  Eyes: Conjunctivae, EOM and lids are normal. Pupils are equal, round, and reactive to light.  Neck: Trachea normal, normal range of motion and full passive range of motion without pain. Neck supple. No  JVD present. No tracheal deviation, no edema and no erythema present. No thyromegaly present.  Cardiovascular: Normal rate, regular rhythm, normal heart sounds, intact distal pulses and normal pulses.  Exam reveals no gallop, no distant heart sounds and no friction rub.   No murmur heard. Pulmonary/Chest: Effort normal and breath sounds normal. No accessory muscle usage. No respiratory distress. She has no wheezes. She has no rales. She exhibits no tenderness.  Abdominal: Normal appearance and bowel sounds are normal. She exhibits no distension and no ascites. There is no tenderness.  Musculoskeletal: Normal range of motion. She exhibits no edema or tenderness.  Expected osteoarthritis, stiffness  Neurological: She is alert and oriented to person, place, and time. She has normal strength.  Skin: Skin is warm, dry and intact. No rash noted. She is not diaphoretic. No cyanosis or erythema. No pallor. Nails show no clubbing.  Psychiatric: She has a normal mood and affect. Her speech is normal and behavior is normal. Judgment and thought  content normal. Cognition and memory are normal.  Nursing note and vitals reviewed.   Labs reviewed:  Recent Labs  09/09/16 1930 10/19/16 1525 10/29/16 0848 11/07/16 1228  NA 137 136  --  136  K 4.3 4.0  --  4.3  CL 100* 102  --  97*  CO2 29 25  --  33*  GLUCOSE 103* 94  --  96  BUN 31* 43*  --  37*  CREATININE 0.85 1.38* 1.00 1.18*  CALCIUM 9.3 8.9  --  8.9  MG  --  2.2  --   --     Recent Labs  09/09/16 1930 10/19/16 1525 11/07/16 1228  AST _0 ALT 12* 13* 11*  ALKPHOS 54 52 57  BILITOT 0.7 0.6 0.7  PROT 7.2 7.0 6.9  ALBUMIN 3.7 3.6 3.2*    Recent Labs  09/09/16 1930 10/19/16 1525 11/07/16 1228  WBC 8.8 8.5 8.4  NEUTROABS 5.9 5.5 6.4  HGB 13.0 13.5 12.5  HCT 38.7 39.6 36.5  MCV 98.8 98.9 98.7  PLT 257 318 197   Lab Results  Component Value Date   TSH 0.667 10/19/2016   No results found for: HGBA1C Lab Results    Component Value Date   CHOL 198 10/19/2016   HDL 60 10/19/2016   LDLCALC 123 (H) 10/19/2016   TRIG 74 10/19/2016   CHOLHDL 3.3 10/19/2016    Significant Diagnostic Results in last 30 days:  No results found.  Assessment/Plan 1. Late onset Alzheimer's disease without behavioral disturbance 2. Dementia in other diseases classified elsewhere without behavioral disturbance  Stable  Continue Aricept 10 mg po Q HS  3. Unspecified mood (affective) disorder (HCC)  Stable   Continue Depakote 250 mg po TID  4. Hx of falling  Use assistive devices/ walker for ambulation  Fall precautions   5. Scoliosis, unspecified scoliosis type, unspecified spinal region  Stable  Continue Tylenol 650 mg po QID  Continue Lidocaine 5% patch Q day  Continue Tramadol 50 mg po Q 4 hours prn  6. Essential hypertension  Stable  Continue Valsartan-HCTZ 160-12.5 mg po Q Day  Continue ASA 81 mg po Q Day  7. Deficiency of other specified B group vitamins  Stable  Continue Cyanocobalamin 1,000 mcg po Q Day  8. Major depressive disorder with single episode, remission status unspecified  Stable  Continue venlafaxine ER 75 mg po Q Day  9. Anxiety disorder, unspecified type  Stable  Continue Venlafaxine ER 75 mg po Q Day  Continue Xanax 0.25 mg po Q Day  Continue Xanax 0.5 mg po Q HS  10. Gastroesophageal reflux disease without esophagitis  Stable     Family/ staff Communication:   Total Time:  Documentation:  Face to Face:  Family/Phone:   Labs/tests ordered:    Medication list reviewed and assessed for continued appropriateness. Monthly medication orders reviewed and signed.  Vikki Ports, NP-C Geriatrics [facility] [phone], [place] [address] Cell Phone (Mon-Fri 8am-5pm):  [phone] On Call:  [phone] & follow prompts after 5pm & weekends Office Phone:  [phone] Office Fax:  [phone]

## 2017-01-23 ENCOUNTER — Encounter
Admission: RE | Admit: 2017-01-23 | Discharge: 2017-01-23 | Disposition: A | Discharge status: Home / Self Care | Payer: Medicare Other | Source: Ambulatory Visit | Attending: Internal Medicine | Admitting: Internal Medicine

## 2017-02-10 DIAGNOSIS — I10 Essential (primary) hypertension: Secondary | ICD-10-CM | POA: Diagnosis not present

## 2017-02-10 DIAGNOSIS — F0390 Unspecified dementia without behavioral disturbance: Secondary | ICD-10-CM | POA: Diagnosis not present

## 2017-02-10 DIAGNOSIS — F330 Major depressive disorder, recurrent, mild: Secondary | ICD-10-CM | POA: Diagnosis not present

## 2017-02-10 DIAGNOSIS — R600 Localized edema: Secondary | ICD-10-CM | POA: Diagnosis not present

## 2017-02-15 DIAGNOSIS — F39 Unspecified mood [affective] disorder: Secondary | ICD-10-CM | POA: Diagnosis not present

## 2017-02-15 DIAGNOSIS — F0280 Dementia in other diseases classified elsewhere without behavioral disturbance: Secondary | ICD-10-CM | POA: Diagnosis not present

## 2017-02-15 DIAGNOSIS — G301 Alzheimer's disease with late onset: Secondary | ICD-10-CM | POA: Diagnosis not present

## 2017-02-15 DIAGNOSIS — F339 Major depressive disorder, recurrent, unspecified: Secondary | ICD-10-CM | POA: Diagnosis not present

## 2017-02-23 ENCOUNTER — Encounter
Admission: RE | Admit: 2017-02-23 | Discharge: 2017-02-23 | Disposition: A | Discharge status: Home / Self Care | Payer: Medicare Other | Source: Ambulatory Visit | Attending: Internal Medicine | Admitting: Internal Medicine

## 2017-02-28 ENCOUNTER — Ambulatory Visit (INDEPENDENT_AMBULATORY_CARE_PROVIDER_SITE_OTHER): Payer: Medicare Other | Admitting: Podiatry

## 2017-02-28 DIAGNOSIS — M79609 Pain in unspecified limb: Secondary | ICD-10-CM

## 2017-02-28 DIAGNOSIS — B351 Tinea unguium: Secondary | ICD-10-CM

## 2017-02-28 NOTE — Progress Notes (Signed)
Complaint:  Visit Type: Patient returns to my office for continued preventative foot care services. Complaint: Patient states" my nails have grown long and thick and become painful to walk and wear shoes" . The patient presents for preventative foot care services. No changes to ROS  Podiatric Exam: Vascular: dorsalis pedis and posterior tibial pulses are palpable bilateral. Capillary return is immediate. Temperature gradient is WNL. Skin turgor WNL  Sensorium: Normal Semmes Weinstein monofilament test. Normal tactile sensation bilaterally. Nail Exam: Pt has thick disfigured discolored nails with subungual debris noted bilateral entire nail hallux through fifth toenails Ulcer Exam: There is no evidence of ulcer or pre-ulcerative changes or infection. Orthopedic Exam: Muscle tone and strength are WNL. No limitations in general ROM. No crepitus or effusions noted. Foot type and digits show no abnormalities. Bony prominences are unremarkable. Skin: No Porokeratosis. No infection or ulcers  Diagnosis:  Onychomycosis, , Pain in right toe, pain in left toes  Treatment & Plan Procedures and Treatment: Consent by patient was obtained for treatment procedures. The patient understood the discussion of treatment and procedures well. All questions were answered thoroughly reviewed. Debridement of mycotic and hypertrophic toenails, 1 through 5 bilateral and clearing of subungual debris. No ulceration, no infection noted.  Return Visit-Office Procedure: Patient instructed to return to the office for a follow up visit 10 weeks  for continued evaluation and treatment.    Gayatri Teasdale DPM 

## 2017-03-25 ENCOUNTER — Encounter: Payer: Self-pay | Admitting: Gerontology

## 2017-03-25 ENCOUNTER — Non-Acute Institutional Stay: Payer: Medicare Other | Admitting: Gerontology

## 2017-03-25 DIAGNOSIS — F329 Major depressive disorder, single episode, unspecified: Secondary | ICD-10-CM | POA: Diagnosis not present

## 2017-03-25 DIAGNOSIS — F419 Anxiety disorder, unspecified: Secondary | ICD-10-CM | POA: Diagnosis not present

## 2017-03-25 DIAGNOSIS — E538 Deficiency of other specified B group vitamins: Secondary | ICD-10-CM | POA: Diagnosis not present

## 2017-03-25 DIAGNOSIS — F0280 Dementia in other diseases classified elsewhere without behavioral disturbance: Secondary | ICD-10-CM | POA: Diagnosis not present

## 2017-03-25 DIAGNOSIS — M419 Scoliosis, unspecified: Secondary | ICD-10-CM | POA: Diagnosis not present

## 2017-03-25 DIAGNOSIS — F39 Unspecified mood [affective] disorder: Secondary | ICD-10-CM | POA: Diagnosis not present

## 2017-03-25 DIAGNOSIS — G301 Alzheimer's disease with late onset: Secondary | ICD-10-CM

## 2017-03-25 DIAGNOSIS — K219 Gastro-esophageal reflux disease without esophagitis: Secondary | ICD-10-CM

## 2017-03-25 DIAGNOSIS — Z9181 History of falling: Secondary | ICD-10-CM | POA: Diagnosis not present

## 2017-03-25 DIAGNOSIS — I10 Essential (primary) hypertension: Secondary | ICD-10-CM | POA: Diagnosis not present

## 2017-03-25 NOTE — Progress Notes (Signed)
Location:   [place] [facility] [employer] [facility] Service:  ALF [phone]) Provider:  Toni Arthurs, NP-C  Roselee Nova, MD  Patient Care Team: Roselee Nova, MD as PCP - [facility])  Extended Emergency Contact Information Primary Emergency Contact: Telford,[name] Address: [address]          [place], [place] [address] [place] Phone: [phone] Relation: Daughter  Code Status:  DNR Goals of care: Advanced Directive information Advanced Directives 03/25/2017  Does Patient Have a Medical Advance Directive? Yes  Type of Paramedic of [place];Out of facility DNR (pink MOST or yellow form)  Does patient want to make changes to medical advance directive? No - Patient declined  Copy of [place] in Chart? No - copy requested  Would patient like information on creating a medical advance directive? -     Chief Complaint  Patient presents with  . Medical Management of Chronic Issues    Routine Visit    HPI:  Pt is a 83 y.o. female seen today for medical management of chronic diseases. Pt has been stable over the past month. Still has intermittent crying episodes. Pt's mood has improved. No reported falls this month. Pt is interacting with others and participating in group activities. She's smiling and seems more relaxed or settled. Pt is eating well. Having regular BMs. Ambulates with rolator. Palliative Care following pt. [facility] also following pt. VSS. No other complaints.    Past Medical History:  Diagnosis Date  . Anxiety    unspecified  . Chronic back pain unk  . Depression unk  . GERD (gastroesophageal reflux disease)   . History of hiatal hernia 08/08/2011  . Hyperlipidemia, unspecified   . Hypertension   . Lymphocytic colitis   . MGUS (monoclonal gammopathy of unknown significance)   . Osteopenia   . Scoliosis    born with   . Scoliosis deformity of spine    Past Surgical  History:  Procedure Laterality Date  . ABDOMINAL HYSTERECTOMY  1973  . HERNIA REPAIR    . NECK SURGERY    . NISSEN FUNDOPLICATION      No Known Allergies  Allergies as of 03/25/2017   No Known Allergies     Medication List       Accurate as of 03/25/17  2:40 PM. Always use your most recent med list.          acetaminophen 325 MG tablet Commonly known as:  TYLENOL Take 650 mg by mouth 4 (four) times daily.   ALPRAZolam 1 MG tablet Commonly known as:  XANAX Take 0.5 mg by mouth every morning. 1/2 tablet   ALPRAZolam 1 MG tablet Commonly known as:  XANAX Take 1 mg by mouth at bedtime.   aspirin EC 81 MG tablet Take 81 mg by mouth daily.   B-12 1000 MCG/ML Kit Inject 1,000 mcg as directed every 30 (thirty) days. Every first of the month.   divalproex 125 MG capsule Commonly known as:  DEPAKOTE SPRINKLE Take 250 mg by mouth 3 (three) times daily with meals.   donepezil 10 MG tablet Commonly known as:  ARICEPT Take 10 mg by mouth at bedtime. Per Gurney Maxin, MD   lidocaine 5 % Commonly known as:  LIDODERM Place 1 patch onto the skin daily. Remove & Discard patch within 12 hours or as directed by MD   traMADol 50 MG tablet Commonly known as:  ULTRAM Take 50 mg by mouth every 4 (four) hours as needed.   valsartan-hydrochlorothiazide 160-12.5 MG tablet Commonly known as:  DIOVAN-HCT Take 1 tablet by mouth every morning.   venlafaxine XR 75 MG 24 hr capsule Commonly known as:  EFFEXOR-XR Take 75 mg by mouth daily.       Review of Systems  Constitutional: Negative for activity change, appetite change, chills, diaphoresis and fever.  HENT: Negative for congestion, sneezing, sore throat, trouble swallowing and voice change.   Respiratory: Negative for apnea, cough, choking, chest tightness, shortness of breath and wheezing.   Cardiovascular: Negative for chest pain, palpitations and leg swelling.  Gastrointestinal: Negative for abdominal distention,  abdominal pain, constipation, diarrhea and nausea.  Genitourinary: Negative for difficulty urinating, dysuria, frequency and urgency.  Musculoskeletal: Positive for arthralgias (typical arthritis) and back pain. Negative for gait problem and myalgias.  Skin: Negative for color change, pallor, rash and wound.  Neurological: Negative for dizziness, tremors, syncope, speech difficulty, weakness, numbness and headaches.  Psychiatric/Behavioral: Negative for agitation and behavioral problems.  All other systems reviewed and are negative.   Immunization History  Administered Date(s) Administered  . Influenza,inj,Quad PF,6+ Mos 04/04/2013, 04/23/2014  . Influenza-Unspecified 05/01/2012, 04/04/2013, 04/25/2014, 04/26/2015, 04/26/2016  . Tdap 07/20/2015   Pertinent  Health Maintenance Due  Topic Date Due  . DEXA SCAN  04/07/1991  . PNA vac Low Risk Adult (1 of 2 - PCV13) 04/07/1991  . INFLUENZA VACCINE  02/23/2017   Fall Risk  08/04/2016 06/02/2016 01/29/2016 12/09/2015 09/11/2015  Falls in the past year? Yes No No No Yes  Number falls in past yr: 1 - - - 1  Comment - - - - -  Injury with Fall? No - - - Yes  Comment - - - - cracked head had to have 13 stitches  Risk Factor Category  - - - - -  Comment - - - - -  Risk for fall due to : - - - - Other (Comment)  Risk for fall due to: Comment - - - - hit head on toilet when she fell  Follow up Falls evaluation completed - - - Falls evaluation completed  Comment - - - - -   Functional Status Survey:    Vitals:   03/25/17 1411  BP: (!) 149/88  Pulse: (!) 102  Resp: 20  Temp: (!) 97.4 F (36.3 C)  SpO2: 98%  Weight: 146 lb 12.8 oz (66.6 kg)  Height: '4\' 10"'  (1.473 m)   Body mass index is 30.68 kg/m. Physical Exam  Constitutional: She is oriented to person, place, and time. Vital signs are normal. She appears well-developed and well-nourished. She is active and cooperative. She does not appear ill. No distress.  HENT:  Head:  Normocephalic and atraumatic.  Mouth/Throat: Uvula is midline, oropharynx is clear and moist and mucous membranes are normal. Mucous membranes are not pale, not dry and not cyanotic.  Eyes: Pupils are equal, round, and reactive to light. Conjunctivae, EOM and lids are normal.  Neck: Trachea normal, normal range of motion and full passive range of motion without pain. Neck supple. No JVD present. No tracheal deviation, no edema and no erythema present. No thyromegaly present.  Cardiovascular: Normal rate, regular rhythm, normal heart sounds, intact distal pulses and normal pulses.  Exam reveals no gallop, no distant heart sounds and no friction rub.   No murmur heard. Pulmonary/Chest: Effort normal and breath sounds normal. No accessory muscle usage. No respiratory distress. She has no  wheezes. She has no rales. She exhibits no tenderness.  Abdominal: Normal appearance and bowel sounds are normal. She exhibits no distension and no ascites. There is no tenderness.  Musculoskeletal: Normal range of motion. She exhibits no edema or tenderness.  Expected osteoarthritis, stiffness  Neurological: She is alert and oriented to person, place, and time. She has normal strength.  Skin: Skin is warm, dry and intact. No rash noted. She is not diaphoretic. No cyanosis or erythema. No pallor. Nails show no clubbing.  Psychiatric: She has a normal mood and affect. Her speech is normal and behavior is normal. Judgment and thought content normal. Cognition and memory are normal.  Nursing note and vitals reviewed.   Labs reviewed:  Recent Labs  09/09/16 1930 10/19/16 1525 10/29/16 0848 11/07/16 1228  NA 137 136  --  136  K 4.3 4.0  --  4.3  CL 100* 102  --  97*  CO2 29 25  --  33*  GLUCOSE 103* 94  --  96  BUN 31* 43*  --  37*  CREATININE 0.85 1.38* 1.00 1.18*  CALCIUM 9.3 8.9  --  8.9  MG  --  2.2  --   --     Recent Labs  09/09/16 1930 10/19/16 1525 11/07/16 1228  AST '23 22 19  ' ALT 12* 13* 11*   ALKPHOS 54 52 57  BILITOT 0.7 0.6 0.7  PROT 7.2 7.0 6.9  ALBUMIN 3.7 3.6 3.2*    Recent Labs  09/09/16 1930 10/19/16 1525 11/07/16 1228  WBC 8.8 8.5 8.4  NEUTROABS 5.9 5.5 6.4  HGB 13.0 13.5 12.5  HCT 38.7 39.6 36.5  MCV 98.8 98.9 98.7  PLT 257 318 197   Lab Results  Component Value Date   TSH 0.667 10/19/2016   No results found for: HGBA1C Lab Results  Component Value Date   CHOL 198 10/19/2016   HDL 60 10/19/2016   LDLCALC 123 (H) 10/19/2016   TRIG 74 10/19/2016   CHOLHDL 3.3 10/19/2016    Significant Diagnostic Results in last 30 days:  No results found.  Assessment/Plan 1. Late onset Alzheimer's disease without behavioral disturbance 2. Dementia in other diseases classified elsewhere without behavioral disturbance  Stable  Continue Aricept 10 mg po Q HS  3. Unspecified mood (affective) disorder (HCC)  Stable   Continue Depakote 250 mg po TID  4. Hx of falling  Use assistive devices/ walker for ambulation  Fall precautions   5. Scoliosis, unspecified scoliosis type, unspecified spinal region  Stable  Continue Tylenol 650 mg po QID  Continue Lidocaine 5% patch Q day  Continue Tramadol 50 mg po Q 4 hours prn  6. Essential hypertension  Stable  Continue Valsartan-HCTZ 160-12.5 mg po Q Day  Continue ASA 81 mg po Q Day  7. Deficiency of other specified B group vitamins  Stable  Continue Cyanocobalamin 1,000 mcg po Q Day  8. Major depressive disorder with single episode, remission status unspecified  Stable  Continue venlafaxine ER 75 mg po Q Day  9. Anxiety disorder, unspecified type  Stable  Continue Venlafaxine ER 75 mg po Q Day  Continue Xanax 0.25 mg po Q Day  Continue Xanax 0.5 mg po Q HS  10. Gastroesophageal reflux disease without esophagitis  Stable   Family/ staff Communication:   Total Time:  Documentation:  Face to Face:  Family/Phone:   Labs/tests ordered:    Medication list reviewed and  assessed for continued appropriateness. Monthly medication orders reviewed  and signed.  Vikki Ports, NP-C Geriatrics [facility] [phone], [place] [address] Cell Phone (Mon-Fri 8am-5pm):  [phone] On Call:  [phone] & follow prompts after 5pm & weekends Office Phone:  [phone] Office Fax:  [phone]

## 2017-03-26 ENCOUNTER — Encounter
Admission: RE | Admit: 2017-03-26 | Discharge: 2017-03-26 | Disposition: A | Discharge status: Home / Self Care | Payer: Medicare Other | Source: Ambulatory Visit | Attending: Internal Medicine | Admitting: Internal Medicine

## 2017-04-04 DIAGNOSIS — R296 Repeated falls: Secondary | ICD-10-CM | POA: Diagnosis not present

## 2017-04-04 DIAGNOSIS — F39 Unspecified mood [affective] disorder: Secondary | ICD-10-CM | POA: Diagnosis not present

## 2017-04-04 DIAGNOSIS — F0390 Unspecified dementia without behavioral disturbance: Secondary | ICD-10-CM | POA: Diagnosis not present

## 2017-04-06 ENCOUNTER — Other Ambulatory Visit: Payer: Self-pay

## 2017-04-06 DIAGNOSIS — G301 Alzheimer's disease with late onset: Secondary | ICD-10-CM | POA: Diagnosis not present

## 2017-04-06 MED ORDER — ALPRAZOLAM 1 MG PO TABS: 0.5000 mg | ORAL_TABLET | ORAL | 4 refills | Status: DC

## 2017-04-06 NOTE — Telephone Encounter (Signed)
Rx sent to [facility] phone : [phone] , fax : [phone]  

## 2017-04-07 DIAGNOSIS — G301 Alzheimer's disease with late onset: Secondary | ICD-10-CM | POA: Diagnosis not present

## 2017-04-11 DIAGNOSIS — G301 Alzheimer's disease with late onset: Secondary | ICD-10-CM | POA: Diagnosis not present

## 2017-04-13 DIAGNOSIS — G301 Alzheimer's disease with late onset: Secondary | ICD-10-CM | POA: Diagnosis not present

## 2017-04-18 DIAGNOSIS — G301 Alzheimer's disease with late onset: Secondary | ICD-10-CM | POA: Diagnosis not present

## 2017-04-19 DIAGNOSIS — G301 Alzheimer's disease with late onset: Secondary | ICD-10-CM | POA: Diagnosis not present

## 2017-04-22 ENCOUNTER — Non-Acute Institutional Stay: Payer: Medicare Other | Admitting: Gerontology

## 2017-04-22 ENCOUNTER — Encounter: Payer: Self-pay | Admitting: Gerontology

## 2017-04-22 DIAGNOSIS — M419 Scoliosis, unspecified: Secondary | ICD-10-CM | POA: Diagnosis not present

## 2017-04-22 DIAGNOSIS — F329 Major depressive disorder, single episode, unspecified: Secondary | ICD-10-CM | POA: Diagnosis not present

## 2017-04-22 DIAGNOSIS — G301 Alzheimer's disease with late onset: Secondary | ICD-10-CM | POA: Diagnosis not present

## 2017-04-22 DIAGNOSIS — F419 Anxiety disorder, unspecified: Secondary | ICD-10-CM | POA: Diagnosis not present

## 2017-04-22 DIAGNOSIS — I10 Essential (primary) hypertension: Secondary | ICD-10-CM | POA: Diagnosis not present

## 2017-04-22 DIAGNOSIS — F39 Unspecified mood [affective] disorder: Secondary | ICD-10-CM

## 2017-04-22 DIAGNOSIS — K219 Gastro-esophageal reflux disease without esophagitis: Secondary | ICD-10-CM

## 2017-04-22 DIAGNOSIS — F0280 Dementia in other diseases classified elsewhere without behavioral disturbance: Secondary | ICD-10-CM | POA: Diagnosis not present

## 2017-04-22 DIAGNOSIS — E538 Deficiency of other specified B group vitamins: Secondary | ICD-10-CM | POA: Diagnosis not present

## 2017-04-22 DIAGNOSIS — Z9181 History of falling: Secondary | ICD-10-CM | POA: Diagnosis not present

## 2017-04-22 NOTE — Progress Notes (Signed)
Location:   [place] Room Number: 250 Place of Service:  ALF [phone]) Provider:  Toni Arthurs, NP-C  Roselee Nova, MD  Patient Care Team: Roselee Nova, MD as PCP - [facility])  Extended Emergency Contact Information Primary Emergency Contact: Estes,Cindy Address: [address]          [place], [place] [address] [place] Phone: [phone] Relation: Daughter  Code Status:  DNR Goals of care: Advanced Directive information Advanced Directives 04/22/2017  Does Patient Have a Medical Advance Directive? Yes  Type of Advance Directive Out of facility DNR (pink MOST or yellow form)  Does patient want to make changes to medical advance directive? No - Patient declined  Copy of [place] in Chart? -  Would patient like information on creating a medical advance directive? -     Chief Complaint  Patient presents with  . Medical Management of Chronic Issues    Routine Visit    HPI:  Pt is a 83 y.o. Estes seen today for medical management of chronic diseases. Pt has been stable over the past month. Still has intermittent crying episode. Pt's mood has improved. No reported falls this month.. Pt is interacting with others and participating in group activities. She's smiling more and seems more relaxed and settled. Pt is eating well. Having regular BMs..Ambulates with rolator.. Palliative Care following pt. As well as psych. VSS. No other complaints.     Past Medical History:  Diagnosis Date  . Anxiety    unspecified  . Chronic back pain unk  . Depression unk  . GERD (gastroesophageal reflux disease)   . History of hiatal hernia 08/08/2011  . Hyperlipidemia, unspecified   . Hypertension   . Lymphocytic colitis   . MGUS (monoclonal gammopathy of unknown significance)   . Osteopenia   . Scoliosis    born with   . Scoliosis deformity of spine    Past Surgical History:  Procedure Laterality  Date  . ABDOMINAL HYSTERECTOMY  1973  . HERNIA REPAIR    . NECK SURGERY    . NISSEN FUNDOPLICATION      No Known Allergies  Allergies as of 04/22/2017   No Known Allergies     Medication List       Accurate as of 04/22/17  4:17 PM. Always use your most recent med list.          acetaminophen 325 MG tablet Commonly known as:  TYLENOL Take 650 mg by mouth 4 (four) times daily.   ALPRAZolam 1 MG tablet Commonly known as:  XANAX Take 1 mg by mouth at bedtime.   ALPRAZolam 1 MG tablet Commonly known as:  XANAX Take 0.5 tablets (0.5 mg total) by mouth every morning. 1/2 tablet   aspirin EC 81 MG tablet Take 81 mg by mouth daily.   B-12 1000 MCG/ML Kit Inject 1,000 mcg as directed every 30 (thirty) days. Every first of the month.   divalproex 125 MG capsule Commonly known as:  DEPAKOTE SPRINKLE Take 250 mg by mouth 3 (three) times daily with meals.   donepezil 10 MG tablet Commonly known as:  ARICEPT Take 10 mg by mouth at bedtime. Per Gurney Maxin, MD   lidocaine 5 % Commonly known as:  LIDODERM Place 1 patch onto the skin daily. Remove & Discard patch within 12 hours or as directed by MD   memantine 5 MG tablet Commonly known as:  NAMENDA Take 5 mg by mouth 2 (two) times daily. Per Chipper Herb, NP   traMADol 50 MG tablet Commonly known as:  ULTRAM Take 50 mg by mouth every 4 (four) hours as needed.   valsartan-hydrochlorothiazide 160-12.5 MG tablet Commonly known as:  DIOVAN-HCT Take 1 tablet by mouth every morning.   venlafaxine XR 75 MG 24 hr capsule Commonly known as:  EFFEXOR-XR Take 75 mg by mouth daily.       Review of Systems  Constitutional: Negative for activity change, appetite change, chills, diaphoresis and fever.  HENT: Negative for congestion, sneezing, sore throat, trouble swallowing and voice change.   Respiratory: Negative for apnea, cough, choking, chest tightness, shortness of breath and wheezing.   Cardiovascular: Negative for  chest pain, palpitations and leg swelling.  Gastrointestinal: Negative for abdominal distention, abdominal pain, constipation, diarrhea and nausea.  Genitourinary: Negative for difficulty urinating, dysuria, frequency and urgency.  Musculoskeletal: Positive for arthralgias (typical arthritis) and back pain. Negative for gait problem and myalgias.  Skin: Negative for color change, pallor, rash and wound.  Neurological: Negative for dizziness, tremors, syncope, speech difficulty, weakness, numbness and headaches.  Psychiatric/Behavioral: Negative for agitation and behavioral problems.  All other systems reviewed and are negative.   Immunization History  Administered Date(s) Administered  . Influenza,inj,Quad PF,6+ Mos 04/04/2013, 04/23/2014  . Influenza-Unspecified 05/01/2012, 04/04/2013, 04/25/2014, 04/26/2015, 04/26/2016  . Tdap 07/20/2015   Pertinent  Health Maintenance Due  Topic Date Due  . DEXA SCAN  04/07/1991  . PNA vac Low Risk Adult (1 of 2 - PCV13) 04/07/1991  . INFLUENZA VACCINE  02/23/2017   Fall Risk  08/04/2016 06/02/2016 01/29/2016 12/09/2015 09/11/2015  Falls in the past year? Yes No No No Yes  Number falls in past yr: 1 - - - 1  Comment - - - - -  Injury with Fall? No - - - Yes  Comment - - - - cracked head had to have 13 stitches  Risk Factor Category  - - - - -  Comment - - - - -  Risk for fall due to : - - - - Other (Comment)  Risk for fall due to: Comment - - - - hit head on toilet when she fell  Follow up Falls evaluation completed - - - Falls evaluation completed  Comment - - - - -   Functional Status Survey:    Vitals:   04/22/17 1616  BP: (!) 157/72  Pulse: 87  Resp: 20  Temp: 98.7 F (37.1 C)  SpO2: 94%  Weight: 147 lb (66.7 kg)  Height: '4\' 10"'  (1.473 m)   Body mass index is 30.72 kg/m. Physical Exam  Constitutional: She is oriented to person, place, and time. Vital signs are normal. She appears well-developed and well-nourished. She is active  and cooperative. She does not appear ill. No distress.  HENT:  Head: Normocephalic and atraumatic.  Mouth/Throat: Uvula is midline, oropharynx is clear and moist and mucous membranes are normal. Mucous membranes are not pale, not dry and not cyanotic.  Eyes: Pupils are equal, round, and reactive to light. Conjunctivae, EOM and lids are normal.  Neck: Trachea normal, normal range of motion and full passive range of motion without pain. Neck supple. No JVD present. No tracheal deviation, no edema and no erythema present. No thyromegaly present.  Cardiovascular: Normal rate, regular rhythm, normal heart sounds, intact distal pulses and normal pulses.  Exam reveals no gallop, no distant heart sounds and no friction rub.  No murmur heard. Pulmonary/Chest: Effort normal and breath sounds normal. No accessory muscle usage. No respiratory distress. She has no wheezes. She has no rales. She exhibits no tenderness.  Abdominal: Normal appearance and bowel sounds are normal. She exhibits no distension and no ascites. There is no tenderness.  Musculoskeletal: Normal range of motion. She exhibits no edema or tenderness.  Expected osteoarthritis, stiffness  Neurological: She is alert and oriented to person, place, and time. She has normal strength.  Skin: Skin is warm, dry and intact. No rash noted. She is not diaphoretic. No cyanosis or erythema. No pallor. Nails show no clubbing.  Psychiatric: She has a normal mood and affect. Her speech is normal and behavior is normal. Judgment and thought content normal. Cognition and memory are normal.  Nursing note and vitals reviewed.   Labs reviewed:  Recent Labs  09/09/16 1930 10/19/16 1525 10/29/16 0848 11/07/16 1228  NA 137 136  --  136  K 4.3 4.0  --  4.3  CL 100* 102  --  97*  CO2 29 25  --  33*  GLUCOSE 103* 94  --  96  BUN 31* 43*  --  37*  CREATININE 0.85 1.38* 1.00 1.18*  CALCIUM 9.3 8.9  --  8.9  MG  --  2.2  --   --     Recent Labs   09/09/16 1930 10/19/16 1525 11/07/16 1228  AST '23 22 19  ' ALT 12* 13* 11*  ALKPHOS 54 52 57  BILITOT 0.7 0.6 0.7  PROT 7.2 7.0 6.9  ALBUMIN 3.7 3.6 3.2*    Recent Labs  09/09/16 1930 10/19/16 1525 11/07/16 1228  WBC 8.8 8.5 8.4  NEUTROABS 5.9 5.5 6.4  HGB 13.0 13.5 12.5  HCT 38.7 39.6 36.5  MCV 98.8 98.9 98.7  PLT 257 318 197   Lab Results  Component Value Date   TSH 0.667 10/19/2016   No results found for: HGBA1C Lab Results  Component Value Date   CHOL 198 10/19/2016   HDL 60 10/19/2016   LDLCALC 123 (H) 10/19/2016   TRIG 74 10/19/2016   CHOLHDL 3.3 10/19/2016    Significant Diagnostic Results in last 30 days:  No results found.  Assessment/Plan 1. Late onset Alzheimer's disease without behavioral disturbance 2. Dementia in other diseases classified elsewhere without behavioral disturbance  Stable  Continue Aricept 10 mg po Q HS  Namenda 5 mg po BID added by neurology  3. Unspecified mood (affective) disorder (HCC)  Stable   Continue Depakote 250 mg po TID  4. Hx of falling  Use assistive devices/ walker for ambulation  Fall precautions   5. Scoliosis, unspecified scoliosis type, unspecified spinal region  Stable  Continue Tylenol 650 mg po QID  Continue Lidocaine 5% patch Q day  Continue Tramadol 50 mg po Q 4 hours prn  6. Essential hypertension  Stable  Continue Valsartan-HCTZ 160-12.5 mg po Q Day  Continue ASA 81 mg po Q Day  7. Deficiency of other specified B group vitamins  Stable  Continue Cyanocobalamin 1,000 mcg po Q Day  8. Major depressive disorder with single episode, remission status unspecified  Stable  Continue venlafaxine ER 75 mg po Q Day  9. Anxiety disorder, unspecified type  Stable  Continue Venlafaxine ER 75 mg po Q Day  Continue Xanax 0.25 mg po Q Day  Continue Xanax 0.5 mg po Q HS  10. Gastroesophageal reflux disease without esophagitis  Stable   Family/ staff Communication:   Total  Time:  Documentation:  Face to Face:  Family/Phone:   Labs/tests ordered:  Cbc, met c, tsh, B12, D, Mag+  Medication list reviewed and assessed for continued appropriateness. Monthly medication orders reviewed and signed. Continue all medications as listed above.   Vikki Ports, NP-C Geriatrics [facility] [phone], [place] [address] Cell Phone (Mon-Fri 8am-5pm):  [phone] On Call:  [phone] & follow prompts after 5pm & weekends Office Phone:  [phone] Office Fax:  [phone]

## 2017-04-25 ENCOUNTER — Encounter
Admission: RE | Admit: 2017-04-25 | Discharge: 2017-04-25 | Disposition: A | Discharge status: Home / Self Care | Payer: Medicare Other | Source: Ambulatory Visit | Attending: Internal Medicine | Admitting: Internal Medicine

## 2017-04-29 ENCOUNTER — Other Ambulatory Visit: Payer: Self-pay

## 2017-04-29 MED ORDER — ALPRAZOLAM 1 MG PO TABS: 1.0000 mg | ORAL_TABLET | Freq: Every day | ORAL | 3 refills | Status: DC

## 2017-04-29 NOTE — Telephone Encounter (Signed)
Rx sent to [facility] phone : [phone] , fax : [phone]  

## 2017-05-09 ENCOUNTER — Ambulatory Visit: Payer: Medicare Other | Admitting: Podiatry

## 2017-05-10 ENCOUNTER — Other Ambulatory Visit
Admission: RE | Admit: 2017-05-10 | Discharge: 2017-05-10 | Disposition: A | Discharge status: Home / Self Care | Payer: Medicare Other | Source: Skilled Nursing Facility | Attending: Gerontology | Admitting: Gerontology

## 2017-05-10 DIAGNOSIS — I10 Essential (primary) hypertension: Secondary | ICD-10-CM | POA: Insufficient documentation

## 2017-05-10 LAB — CBC WITH DIFFERENTIAL/PLATELET
Basophils Absolute: 0 10*3/uL (ref 0–0.1)
Basophils Relative: 1 %
EOS ABS: 0.1 10*3/uL (ref 0–0.7)
Eosinophils Relative: 2 %
HCT: 37.9 % (ref 35.0–47.0)
HEMOGLOBIN: 12.8 g/dL (ref 12.0–16.0)
LYMPHS ABS: 2.7 10*3/uL (ref 1.0–3.6)
LYMPHS PCT: 41 %
MCH: 34.8 pg — AB (ref 26.0–34.0)
MCHC: 33.8 g/dL (ref 32.0–36.0)
MCV: 103.1 fL — ABNORMAL HIGH (ref 80.0–100.0)
Monocytes Absolute: 0.7 10*3/uL (ref 0.2–0.9)
Monocytes Relative: 10 %
NEUTROS PCT: 46 %
Neutro Abs: 3.1 10*3/uL (ref 1.4–6.5)
Platelets: 246 10*3/uL (ref 150–440)
RBC: 3.67 MIL/uL — AB (ref 3.80–5.20)
RDW: 13.4 % (ref 11.5–14.5)
WBC: 6.5 10*3/uL (ref 3.6–11.0)

## 2017-05-10 LAB — COMPREHENSIVE METABOLIC PANEL
ALBUMIN: 3.1 g/dL — AB (ref 3.5–5.0)
ALT: 9 U/L — AB (ref 14–54)
AST: 14 U/L — AB (ref 15–41)
Alkaline Phosphatase: 34 U/L — ABNORMAL LOW (ref 38–126)
Anion gap: 9 (ref 5–15)
BUN: 40 mg/dL — ABNORMAL HIGH (ref 6–20)
CALCIUM: 9.1 mg/dL (ref 8.9–10.3)
CHLORIDE: 101 mmol/L (ref 101–111)
CO2: 28 mmol/L (ref 22–32)
Creatinine, Ser: 0.81 mg/dL (ref 0.44–1.00)
GFR calc Af Amer: >60 mL/min (ref >60)
Glucose, Bld: 80 mg/dL (ref 65–99)
Potassium: 3.9 mmol/L (ref 3.5–5.1)
Sodium: 138 mmol/L (ref 135–145)
Total Bilirubin: 0.6 mg/dL (ref 0.3–1.2)
Total Protein: 6.4 g/dL — ABNORMAL LOW (ref 6.5–8.1)

## 2017-05-10 LAB — TSH: TSH: 1.048 u[IU]/mL (ref 0.350–4.500)

## 2017-05-10 LAB — MAGNESIUM: MAGNESIUM: 2.1 mg/dL (ref 1.7–2.4)

## 2017-05-10 LAB — VITAMIN B12: VITAMIN B 12: 685 pg/mL (ref 180–914)

## 2017-05-11 LAB — VITAMIN D 25 HYDROXY (VIT D DEFICIENCY, FRACTURES): VIT D 25 HYDROXY: 43.9 ng/mL (ref 30.0–100.0)

## 2017-05-16 ENCOUNTER — Ambulatory Visit (INDEPENDENT_AMBULATORY_CARE_PROVIDER_SITE_OTHER): Payer: Medicare Other | Admitting: Podiatry

## 2017-05-16 DIAGNOSIS — I739 Peripheral vascular disease, unspecified: Secondary | ICD-10-CM

## 2017-05-16 DIAGNOSIS — M79609 Pain in unspecified limb: Secondary | ICD-10-CM | POA: Diagnosis not present

## 2017-05-16 DIAGNOSIS — B351 Tinea unguium: Secondary | ICD-10-CM

## 2017-05-16 NOTE — Progress Notes (Signed)
Complaint:  Visit Type: Patient returns to my office for continued preventative foot care services. Complaint: Patient states" my nails have grown long and thick and become painful to walk and wear shoes" The patient presents for preventative foot care services. No changes to ROS  Podiatric Exam: Vascular: dorsalis pedis and posterior tibial pulses are palpable bilateral. Capillary return is immediate. Temperature gradient is WNL. Skin turgor WNL  Sensorium: Normal Semmes Weinstein monofilament test. Normal tactile sensation bilaterally. Nail Exam: Pt has thick disfigured discolored nails with subungual debris noted bilateral entire nail hallux through fifth toenails Ulcer Exam: There is no evidence of ulcer or pre-ulcerative changes or infection. Orthopedic Exam: Muscle tone and strength are WNL. No limitations in general ROM. No crepitus or effusions noted. Foot type and digits show no abnormalities. Bony prominences are unremarkable. Skin: No Porokeratosis. No infection or ulcers  Diagnosis:  Onychomycosis, , Pain in right toe, pain in left toes  Treatment & Plan Procedures and Treatment: Consent by patient was obtained for treatment procedures. The patient understood the discussion of treatment and procedures well. All questions were answered thoroughly reviewed. Debridement of mycotic and hypertrophic toenails, 1 through 5 bilateral and clearing of subungual debris. No ulceration, no infection noted. ABN signed for 2018. Return Visit-Office Procedure: Patient instructed to return to the office for a follow up visit 10 weeks  for continued evaluation and treatment.    Gardiner Barefoot DPM

## 2017-05-26 ENCOUNTER — Encounter
Admission: RE | Admit: 2017-05-26 | Discharge: 2017-05-26 | Disposition: A | Discharge status: Home / Self Care | Payer: Medicare Other | Source: Ambulatory Visit | Attending: Internal Medicine | Admitting: Internal Medicine

## 2017-05-27 ENCOUNTER — Encounter: Payer: Self-pay | Admitting: Gerontology

## 2017-05-27 ENCOUNTER — Non-Acute Institutional Stay: Payer: Medicare Other | Admitting: Gerontology

## 2017-05-27 DIAGNOSIS — G301 Alzheimer's disease with late onset: Secondary | ICD-10-CM

## 2017-05-27 DIAGNOSIS — I10 Essential (primary) hypertension: Secondary | ICD-10-CM | POA: Diagnosis not present

## 2017-05-27 DIAGNOSIS — F419 Anxiety disorder, unspecified: Secondary | ICD-10-CM

## 2017-05-27 DIAGNOSIS — E538 Deficiency of other specified B group vitamins: Secondary | ICD-10-CM | POA: Diagnosis not present

## 2017-05-27 DIAGNOSIS — F0280 Dementia in other diseases classified elsewhere without behavioral disturbance: Secondary | ICD-10-CM

## 2017-05-27 DIAGNOSIS — Z9181 History of falling: Secondary | ICD-10-CM | POA: Diagnosis not present

## 2017-05-27 DIAGNOSIS — F39 Unspecified mood [affective] disorder: Secondary | ICD-10-CM | POA: Diagnosis not present

## 2017-05-27 DIAGNOSIS — M419 Scoliosis, unspecified: Secondary | ICD-10-CM | POA: Diagnosis not present

## 2017-05-27 DIAGNOSIS — F329 Major depressive disorder, single episode, unspecified: Secondary | ICD-10-CM | POA: Diagnosis not present

## 2017-05-27 DIAGNOSIS — K219 Gastro-esophageal reflux disease without esophagitis: Secondary | ICD-10-CM

## 2017-06-16 NOTE — Progress Notes (Signed)
Location:   [facility] Room Number: [place] of Service:  ALF [phone]) Provider:  Toni Arthurs, NP-C  Roselee Nova, MD  Patient Care Team: Roselee Nova, MD as PCP - General [facility] Medicine)  Extended Emergency Contact Information Primary Emergency Contact: Cabreja,[name] Address: [address]          [place], [place] [address] [place] Phone: [phone] Relation: Daughter  Code Status:  DNR Goals of care: Advanced Directive information Advanced Directives 05/27/2017  Does Patient Have a Medical Advance Directive? Yes  Type of Advance Directive Out of facility DNR (pink MOST or yellow form);Healthcare Power of Attorney  Does patient want to make changes to medical advance directive? No - Patient declined  Copy of [place] in Chart? No - copy requested  Would patient like information on creating a medical advance directive? -     Chief Complaint  Patient presents with  . Medical Management of Chronic Issues    Routine Visit    HPI:  Pt is a 83 y.o. female seen today for medical management of chronic diseases.  Patient has been stable over the past month.  Patient still has intermittent crying episodes, stating she wishes to do and be with her husband.  She is easily redirected.  Overall, patient's mood has improved.  No reported falls this month.  Patient is interacting with others and participating in group activities.  She is smiling more and seems more relaxed and settled.  Patient is eating well.  Having regular BMs.  Ambulates with Rollator.  Palliative care following patient.  Vital signs stable.  No other complaints.  Past Medical History:  Diagnosis Date  . Anxiety    unspecified  . Chronic back pain unk  . Depression unk  . GERD (gastroesophageal reflux disease)   . History of hiatal hernia 08/08/2011  . Hyperlipidemia, unspecified   . Hypertension   . Lymphocytic colitis   . MGUS  (monoclonal gammopathy of unknown significance)   . Osteopenia   . Scoliosis    born with   . Scoliosis deformity of spine    Past Surgical History:  Procedure Laterality Date  . ABDOMINAL HYSTERECTOMY  1973  . HERNIA REPAIR    . NECK SURGERY    . NISSEN FUNDOPLICATION      No Known Allergies  Allergies as of 05/27/2017   No Known Allergies     Medication List        Accurate as of 05/27/17 11:59 PM. Always use your most recent med list.          acetaminophen 325 MG tablet Commonly known as:  TYLENOL Take 650 mg by mouth 4 (four) times daily.   ALPRAZolam 1 MG tablet Commonly known as:  XANAX Take 0.5 tablets (0.5 mg total) by mouth every morning. 1/2 tablet   ALPRAZolam 1 MG tablet Commonly known as:  XANAX Take 1 tablet (1 mg total) by mouth at bedtime.   aspirin EC 81 MG tablet Take 81 mg by mouth daily.   B-12 1000 MCG/ML Kit Inject 1,000 mcg as directed every 30 (thirty) days. Every 2nd of the month.   divalproex 125 MG capsule Commonly known as:  DEPAKOTE SPRINKLE Take 250 mg by mouth 3 (three) times daily with meals.   donepezil 10 MG tablet Commonly known as:  ARICEPT Take 10 mg by mouth at bedtime. Per Gurney Maxin, MD   ENSURE ENLIVE  PO Take 1 Bottle by mouth daily.   lidocaine 5 % Commonly known as:  LIDODERM Place 1 patch onto the skin daily. Remove & Discard patch within 12 hours or as directed by MD   memantine 5 MG tablet Commonly known as:  NAMENDA Take 5 mg by mouth 2 (two) times daily. Per Chipper Herb, NP   traMADol 50 MG tablet Commonly known as:  ULTRAM Take 50 mg by mouth every 4 (four) hours as needed.   valsartan-hydrochlorothiazide 160-12.5 MG tablet Commonly known as:  DIOVAN-HCT Take 1 tablet by mouth every morning.   venlafaxine XR 75 MG 24 hr capsule Commonly known as:  EFFEXOR-XR Take 75 mg by mouth daily.       Review of Systems  Constitutional: Negative for activity change, appetite change, chills,  diaphoresis and fever.  HENT: Negative for congestion, sneezing, sore throat, trouble swallowing and voice change.   Respiratory: Negative for apnea, cough, choking, chest tightness, shortness of breath and wheezing.   Cardiovascular: Negative for chest pain, palpitations and leg swelling.  Gastrointestinal: Negative for abdominal distention, abdominal pain, constipation, diarrhea and nausea.  Genitourinary: Negative for difficulty urinating, dysuria, frequency and urgency.  Musculoskeletal: Positive for arthralgias (typical arthritis) and back pain. Negative for gait problem and myalgias.  Skin: Negative for color change, pallor, rash and wound.  Neurological: Negative for dizziness, tremors, syncope, speech difficulty, weakness, numbness and headaches.  Psychiatric/Behavioral: Negative for agitation and behavioral problems.  All other systems reviewed and are negative.   Immunization History  Administered Date(s) Administered  . Influenza,inj,Quad PF,6+ Mos 04/04/2013, 04/23/2014  . Influenza-Unspecified 05/01/2012, 04/04/2013, 04/25/2014, 04/26/2015, 04/26/2016, 04/19/2017  . Tdap 07/20/2015   Pertinent  Health Maintenance Due  Topic Date Due  . DEXA SCAN  04/07/1991  . PNA vac Low Risk Adult (1 of 2 - PCV13) 04/07/1991  . INFLUENZA VACCINE  Completed   Fall Risk  08/04/2016 06/02/2016 01/29/2016 12/09/2015 09/11/2015  Falls in the past year? Yes No No No Yes  Number falls in past yr: 1 - - - 1  Comment - - - - -  Injury with Fall? No - - - Yes  Comment - - - - cracked head had to have 13 stitches  Risk Factor Category  - - - - -  Comment - - - - -  Risk for fall due to : - - - - Other (Comment)  Risk for fall due to: Comment - - - - hit head on toilet when she fell  Follow up Falls evaluation completed - - - Falls evaluation completed  Comment - - - - -   Functional Status Survey:    Vitals:   05/27/17 1030  BP: 132/66  Pulse: 77  Resp: 16  Temp: 97.8 F (36.6 C)  SpO2:  96%  Weight: 148 lb 14.4 oz (67.5 kg)  Height: '4\' 10"'  (1.473 m)   Body mass index is 31.12 kg/m. Physical Exam  Constitutional: She is oriented to person, place, and time. Vital signs are normal. She appears well-developed and well-nourished. She is active and cooperative. She does not appear ill. No distress.  HENT:  Head: Normocephalic and atraumatic.  Mouth/Throat: Uvula is midline, oropharynx is clear and moist and mucous membranes are normal. Mucous membranes are not pale, not dry and not cyanotic.  Eyes: Conjunctivae, EOM and lids are normal. Pupils are equal, round, and reactive to light.  Neck: Trachea normal, normal range of motion and full passive range of motion without  pain. Neck supple. No JVD present. No tracheal deviation, no edema and no erythema present. No thyromegaly present.  Cardiovascular: Normal rate, regular rhythm, normal heart sounds, intact distal pulses and normal pulses. Exam reveals no gallop, no distant heart sounds and no friction rub.  No murmur heard. Pulses:      Dorsalis pedis pulses are 2+ on the right side, and 2+ on the left side.  No edema  Pulmonary/Chest: Effort normal and breath sounds normal. No accessory muscle usage. No respiratory distress. She has no decreased breath sounds. She has no wheezes. She has no rhonchi. She has no rales. She exhibits no tenderness.  Abdominal: Soft. Normal appearance and bowel sounds are normal. She exhibits no distension and no ascites. There is no tenderness.  Musculoskeletal: Normal range of motion. She exhibits no edema or tenderness.  Expected osteoarthritis, stiffness; calves soft, supple.  Negative Homans sign.  Generalized weakness  Neurological: She is alert and oriented to person, place, and time. She has normal strength.  Skin: Skin is warm, dry and intact. No rash noted. She is not diaphoretic. No cyanosis or erythema. No pallor. Nails show no clubbing.  Psychiatric: Her speech is normal and behavior is  normal. Judgment and thought content normal. Her affect is blunt. Cognition and memory are impaired. She exhibits abnormal recent memory.  Nursing note and vitals reviewed.   Labs reviewed: Recent Labs    10/19/16 1525 10/29/16 0848 11/07/16 1228 05/10/17 0525  NA 136  --  136 138  K 4.0  --  4.3 3.9  CL 102  --  97* 101  CO2 25  --  33* 28  GLUCOSE 94  --  96 80  BUN 43*  --  37* 40*  CREATININE 1.38* 1.00 1.18* 0.81  CALCIUM 8.9  --  8.9 9.1  MG 2.2  --   --  2.1   Recent Labs    10/19/16 1525 11/07/16 1228 05/10/17 0525  AST 22 19 14*  ALT 13* 11* 9*  ALKPHOS 52 57 34*  BILITOT 0.6 0.7 0.6  PROT 7.0 6.9 6.4*  ALBUMIN 3.6 3.2* 3.1*   Recent Labs    10/19/16 1525 11/07/16 1228 05/10/17 0525  WBC 8.5 8.4 6.5  NEUTROABS 5.5 6.4 3.1  HGB 13.5 12.5 12.8  HCT 39.6 36.5 37.9  MCV 98.9 98.7 103.1*  PLT 318 197 246   Lab Results  Component Value Date   TSH 1.048 05/10/2017   No results found for: HGBA1C Lab Results  Component Value Date   CHOL 198 10/19/2016   HDL 60 10/19/2016   LDLCALC 123 (H) 10/19/2016   TRIG 74 10/19/2016   CHOLHDL 3.3 10/19/2016    Significant Diagnostic Results in last 30 days:  No results found.  Assessment/Plan 1. Late onset Alzheimer's disease without behavioral disturbance 2. Dementia in other diseases classified elsewhere without behavioral disturbance  Stable  Continue Aricept 10 mg po Q HS  Continue Namenda 5 mg po BID   3. Unspecified mood (affective) disorder (HCC)  Stable   Continue Depakote 250 mg po TID  4. Hx of falling  Use assistive devices/ walker for ambulation  Fall precautions   5. Scoliosis, unspecified scoliosis type, unspecified spinal region  Stable  Continue Tylenol 650 mg po QID  Continue Lidocaine 5% patch Q day  Continue Tramadol 50 mg po Q 4 hours prn  6. Essential hypertension  Stable  Continue Valsartan-HCTZ 160-12.5 mg po Q Day  Continue ASA 81 mg  po Q Day  7.  Deficiency of other specified B group vitamins  Stable  Continue Cyanocobalamin 1,000 mcg IM Q 30 days  8. Major depressive disorder with single episode, remission status unspecified  Stable  Continue venlafaxine ER 75 mg po Q Day  9. Anxiety disorder, unspecified type  Stable  Continue Venlafaxine ER 75 mg po Q Day  Continue Xanax 0.25 mg po Q Day  Continue Xanax 0.5 mg po Q HS  10. Gastroesophageal reflux disease without esophagitis  Stable   Family/ staff Communication:   Total Time:  Documentation:  Face to Face:  Family/Phone:   Labs/tests ordered: Not due  Medication list reviewed and assessed for continued appropriateness. Monthly medication orders reviewed and signed. Continue all medications as listed above.   Vikki Ports, NP-C Geriatrics [facility] [phone], [place] [address] Cell Phone (Mon-Fri 8am-5pm):  [phone] On Call:  [phone] & follow prompts after 5pm & weekends Office Phone:  [phone] Office Fax:  [phone]

## 2017-06-25 ENCOUNTER — Encounter
Admission: RE | Admit: 2017-06-25 | Discharge: 2017-06-25 | Disposition: A | Discharge status: Home / Self Care | Payer: Medicare Other | Source: Ambulatory Visit | Attending: Internal Medicine | Admitting: Internal Medicine

## 2017-07-06 ENCOUNTER — Non-Acute Institutional Stay: Payer: Medicare Other | Admitting: Gerontology

## 2017-07-06 ENCOUNTER — Encounter: Payer: Self-pay | Admitting: Gerontology

## 2017-07-06 DIAGNOSIS — I10 Essential (primary) hypertension: Secondary | ICD-10-CM

## 2017-07-06 DIAGNOSIS — K52832 Lymphocytic colitis: Secondary | ICD-10-CM | POA: Diagnosis not present

## 2017-07-06 DIAGNOSIS — K219 Gastro-esophageal reflux disease without esophagitis: Secondary | ICD-10-CM | POA: Diagnosis not present

## 2017-07-22 DIAGNOSIS — Z0000 Encounter for general adult medical examination without abnormal findings: Secondary | ICD-10-CM | POA: Diagnosis not present

## 2017-07-22 DIAGNOSIS — R600 Localized edema: Secondary | ICD-10-CM | POA: Diagnosis not present

## 2017-07-22 DIAGNOSIS — F325 Major depressive disorder, single episode, in full remission: Secondary | ICD-10-CM | POA: Diagnosis not present

## 2017-07-22 DIAGNOSIS — I10 Essential (primary) hypertension: Secondary | ICD-10-CM | POA: Diagnosis not present

## 2017-07-22 DIAGNOSIS — F0390 Unspecified dementia without behavioral disturbance: Secondary | ICD-10-CM | POA: Diagnosis not present

## 2017-07-25 ENCOUNTER — Ambulatory Visit (INDEPENDENT_AMBULATORY_CARE_PROVIDER_SITE_OTHER): Payer: Medicare Other | Admitting: Podiatry

## 2017-07-25 ENCOUNTER — Encounter: Payer: Self-pay | Admitting: Podiatry

## 2017-07-25 DIAGNOSIS — M79609 Pain in unspecified limb: Secondary | ICD-10-CM

## 2017-07-25 DIAGNOSIS — B351 Tinea unguium: Secondary | ICD-10-CM

## 2017-07-25 DIAGNOSIS — L853 Xerosis cutis: Secondary | ICD-10-CM

## 2017-07-25 DIAGNOSIS — I739 Peripheral vascular disease, unspecified: Secondary | ICD-10-CM

## 2017-07-25 NOTE — Progress Notes (Signed)
Complaint:  Visit Type: Patient returns to my office for continued preventative foot care services. Complaint: Patient states" my nails have grown long and thick and become painful to walk and wear shoes" The patient presents for preventative foot care services. No changes to ROS  Podiatric Exam: Vascular: dorsalis pedis and posterior tibial pulses are palpable bilateral. Capillary return is immediate. Temperature gradient is WNL. Skin turgor WNL  Sensorium: Normal Semmes Weinstein monofilament test. Normal tactile sensation bilaterally. Nail Exam: Pt has thick disfigured discolored nails with subungual debris noted bilateral entire nail hallux through fifth toenails Ulcer Exam: There is no evidence of ulcer or pre-ulcerative changes or infection. Orthopedic Exam: Muscle tone and strength are WNL. No limitations in general ROM. No crepitus or effusions noted. Foot type and digits show no abnormalities. Bony prominences are unremarkable. Skin: No Porokeratosis. No infection or ulcers  Diagnosis:  Onychomycosis, , Pain in right toe, pain in left toes  Treatment & Plan Procedures and Treatment: Consent by patient was obtained for treatment procedures. The patient understood the discussion of treatment and procedures well. All questions were answered thoroughly reviewed. Debridement of mycotic and hypertrophic toenails, 1 through 5 bilateral and clearing of subungual debris. No ulceration, no infection noted. ABN signed for 2018. Return Visit-Office Procedure: Patient instructed to return to the office for a follow up visit 10 weeks  for continued evaluation and treatment.    Gardiner Barefoot DPM

## 2017-07-26 ENCOUNTER — Encounter
Admission: RE | Admit: 2017-07-26 | Discharge: 2017-07-26 | Disposition: A | Discharge status: Home / Self Care | Payer: Medicare Other | Source: Ambulatory Visit | Attending: Internal Medicine | Admitting: Internal Medicine

## 2017-07-27 DIAGNOSIS — F39 Unspecified mood [affective] disorder: Secondary | ICD-10-CM | POA: Diagnosis not present

## 2017-07-27 DIAGNOSIS — F0390 Unspecified dementia without behavioral disturbance: Secondary | ICD-10-CM | POA: Diagnosis not present

## 2017-07-27 DIAGNOSIS — R296 Repeated falls: Secondary | ICD-10-CM | POA: Diagnosis not present

## 2017-07-30 DIAGNOSIS — Z9181 History of falling: Secondary | ICD-10-CM | POA: Diagnosis not present

## 2017-07-30 DIAGNOSIS — R2689 Other abnormalities of gait and mobility: Secondary | ICD-10-CM | POA: Diagnosis not present

## 2017-07-30 DIAGNOSIS — M419 Scoliosis, unspecified: Secondary | ICD-10-CM | POA: Diagnosis not present

## 2017-07-30 DIAGNOSIS — G301 Alzheimer's disease with late onset: Secondary | ICD-10-CM | POA: Diagnosis not present

## 2017-07-30 DIAGNOSIS — R296 Repeated falls: Secondary | ICD-10-CM | POA: Diagnosis not present

## 2017-07-30 DIAGNOSIS — M6281 Muscle weakness (generalized): Secondary | ICD-10-CM | POA: Diagnosis not present

## 2017-08-01 DIAGNOSIS — G301 Alzheimer's disease with late onset: Secondary | ICD-10-CM | POA: Diagnosis not present

## 2017-08-01 DIAGNOSIS — M6281 Muscle weakness (generalized): Secondary | ICD-10-CM | POA: Diagnosis not present

## 2017-08-01 DIAGNOSIS — R296 Repeated falls: Secondary | ICD-10-CM | POA: Diagnosis not present

## 2017-08-01 DIAGNOSIS — M419 Scoliosis, unspecified: Secondary | ICD-10-CM | POA: Diagnosis not present

## 2017-08-01 DIAGNOSIS — R2689 Other abnormalities of gait and mobility: Secondary | ICD-10-CM | POA: Diagnosis not present

## 2017-08-01 DIAGNOSIS — Z9181 History of falling: Secondary | ICD-10-CM | POA: Diagnosis not present

## 2017-08-03 DIAGNOSIS — Z9181 History of falling: Secondary | ICD-10-CM | POA: Diagnosis not present

## 2017-08-03 DIAGNOSIS — M419 Scoliosis, unspecified: Secondary | ICD-10-CM | POA: Diagnosis not present

## 2017-08-03 DIAGNOSIS — M6281 Muscle weakness (generalized): Secondary | ICD-10-CM | POA: Diagnosis not present

## 2017-08-03 DIAGNOSIS — G301 Alzheimer's disease with late onset: Secondary | ICD-10-CM | POA: Diagnosis not present

## 2017-08-03 DIAGNOSIS — R2689 Other abnormalities of gait and mobility: Secondary | ICD-10-CM | POA: Diagnosis not present

## 2017-08-03 DIAGNOSIS — R296 Repeated falls: Secondary | ICD-10-CM | POA: Diagnosis not present

## 2017-08-05 DIAGNOSIS — R2689 Other abnormalities of gait and mobility: Secondary | ICD-10-CM | POA: Diagnosis not present

## 2017-08-05 DIAGNOSIS — G301 Alzheimer's disease with late onset: Secondary | ICD-10-CM | POA: Diagnosis not present

## 2017-08-05 DIAGNOSIS — R296 Repeated falls: Secondary | ICD-10-CM | POA: Diagnosis not present

## 2017-08-05 DIAGNOSIS — M6281 Muscle weakness (generalized): Secondary | ICD-10-CM | POA: Diagnosis not present

## 2017-08-05 DIAGNOSIS — Z9181 History of falling: Secondary | ICD-10-CM | POA: Diagnosis not present

## 2017-08-05 DIAGNOSIS — M419 Scoliosis, unspecified: Secondary | ICD-10-CM | POA: Diagnosis not present

## 2017-08-09 DIAGNOSIS — M419 Scoliosis, unspecified: Secondary | ICD-10-CM | POA: Diagnosis not present

## 2017-08-09 DIAGNOSIS — R2689 Other abnormalities of gait and mobility: Secondary | ICD-10-CM | POA: Diagnosis not present

## 2017-08-09 DIAGNOSIS — G301 Alzheimer's disease with late onset: Secondary | ICD-10-CM | POA: Diagnosis not present

## 2017-08-09 DIAGNOSIS — M6281 Muscle weakness (generalized): Secondary | ICD-10-CM | POA: Diagnosis not present

## 2017-08-09 DIAGNOSIS — Z9181 History of falling: Secondary | ICD-10-CM | POA: Diagnosis not present

## 2017-08-09 DIAGNOSIS — R296 Repeated falls: Secondary | ICD-10-CM | POA: Diagnosis not present

## 2017-08-10 ENCOUNTER — Encounter: Payer: Self-pay | Admitting: Gerontology

## 2017-08-10 ENCOUNTER — Non-Acute Institutional Stay: Payer: Medicare Other | Admitting: Gerontology

## 2017-08-10 DIAGNOSIS — R296 Repeated falls: Secondary | ICD-10-CM | POA: Diagnosis not present

## 2017-08-10 DIAGNOSIS — R2689 Other abnormalities of gait and mobility: Secondary | ICD-10-CM | POA: Diagnosis not present

## 2017-08-10 DIAGNOSIS — G301 Alzheimer's disease with late onset: Secondary | ICD-10-CM | POA: Diagnosis not present

## 2017-08-10 DIAGNOSIS — N3941 Urge incontinence: Secondary | ICD-10-CM | POA: Diagnosis not present

## 2017-08-10 DIAGNOSIS — Z9181 History of falling: Secondary | ICD-10-CM | POA: Diagnosis not present

## 2017-08-10 DIAGNOSIS — F0280 Dementia in other diseases classified elsewhere without behavioral disturbance: Secondary | ICD-10-CM

## 2017-08-10 DIAGNOSIS — M419 Scoliosis, unspecified: Secondary | ICD-10-CM

## 2017-08-10 DIAGNOSIS — M6281 Muscle weakness (generalized): Secondary | ICD-10-CM | POA: Diagnosis not present

## 2017-08-11 DIAGNOSIS — R2689 Other abnormalities of gait and mobility: Secondary | ICD-10-CM | POA: Diagnosis not present

## 2017-08-11 DIAGNOSIS — R296 Repeated falls: Secondary | ICD-10-CM | POA: Diagnosis not present

## 2017-08-11 DIAGNOSIS — M419 Scoliosis, unspecified: Secondary | ICD-10-CM | POA: Diagnosis not present

## 2017-08-11 DIAGNOSIS — Z9181 History of falling: Secondary | ICD-10-CM | POA: Diagnosis not present

## 2017-08-11 DIAGNOSIS — G301 Alzheimer's disease with late onset: Secondary | ICD-10-CM | POA: Diagnosis not present

## 2017-08-11 DIAGNOSIS — M6281 Muscle weakness (generalized): Secondary | ICD-10-CM | POA: Diagnosis not present

## 2017-08-12 DIAGNOSIS — R2689 Other abnormalities of gait and mobility: Secondary | ICD-10-CM | POA: Diagnosis not present

## 2017-08-12 DIAGNOSIS — M419 Scoliosis, unspecified: Secondary | ICD-10-CM | POA: Diagnosis not present

## 2017-08-12 DIAGNOSIS — Z9181 History of falling: Secondary | ICD-10-CM | POA: Diagnosis not present

## 2017-08-12 DIAGNOSIS — R296 Repeated falls: Secondary | ICD-10-CM | POA: Diagnosis not present

## 2017-08-12 DIAGNOSIS — M6281 Muscle weakness (generalized): Secondary | ICD-10-CM | POA: Diagnosis not present

## 2017-08-12 DIAGNOSIS — G301 Alzheimer's disease with late onset: Secondary | ICD-10-CM | POA: Diagnosis not present

## 2017-08-13 NOTE — Assessment & Plan Note (Signed)
Stable. Pt resides in the Memory Care Unit. Thriving well.

## 2017-08-13 NOTE — Assessment & Plan Note (Signed)
Stable. Pain symptoms managed with scheduled tylenol and prn tramadol.

## 2017-08-13 NOTE — Progress Notes (Signed)
Location[facility] Room Number: 161[facility] Place of Service:  ALF [phone]) Provider:  Toni Arthurs, NP-C  Roselee Nova, MD  Patient Care Team: Roselee Nova, MD as PCP - General (Family Medicine)  Extended Emergency Contact Information Primary Emergency Contact: Pal,[name] Address: [address]          [place], [place] [address] [place] Phone: [phone] Relation: Daughter  Code Status:  DNR Goals of care: Advanced Directive information Advanced Directives 08/10/2017  Does Patient Have a Medical Advance Directive? Yes  Type of Advance Directive Out of facility DNR (pink MOST or yellow form);Healthcare Power of Attorney  Does patient want to make changes to medical advance directive? No - Patient declined  Copy of [place] in Chart? Yes  Would patient like information on creating a medical advance directive? -     Chief Complaint  Patient presents with  . Medical Management of Chronic Issues    Routine Visit    HPI:  Pt is a 84 y.o. female seen today for medical management of chronic diseases.    Essential hypertension Stable and controlled. No episodes of hypotension. Symptoms controlled with valsartain-hctz  Lymphocytic colitis Stable. No recent exacerbations  Acid reflux Stable. No recent worsening of symptoms. No current medicinal treatment  Please not pt with limited verbal ability. Unable to obtain complete ROS. Some ROS info obtained from staff and documentation.   Past Medical History:  Diagnosis Date  . Anxiety    unspecified  . Chronic back pain unk  . Depression unk  . GERD (gastroesophageal reflux disease)   . History of hiatal hernia 08/08/2011  . Hyperlipidemia, unspecified   . Hypertension   . Lymphocytic colitis   . MGUS (monoclonal gammopathy of unknown significance)   . Osteopenia   . Scoliosis    born with   . Scoliosis deformity of spine    Past Surgical History:  Procedure  Laterality Date  . ABDOMINAL HYSTERECTOMY  1973  . HERNIA REPAIR    . NECK SURGERY    . NISSEN FUNDOPLICATION      No Known Allergies  Allergies as of 07/06/2017   No Known Allergies     Medication List        Accurate as of 07/06/17 11:59 PM. Always use your most recent med list.          acetaminophen 325 MG tablet Commonly known as:  TYLENOL Take 650 mg by mouth 4 (four) times daily.   ALPRAZolam 1 MG tablet Commonly known as:  XANAX Take 0.5 tablets (0.5 mg total) by mouth every morning. 1/2 tablet   ALPRAZolam 1 MG tablet Commonly known as:  XANAX Take 1 tablet (1 mg total) by mouth at bedtime.   aspirin EC 81 MG tablet Take 81 mg by mouth daily.   B-12 1000 MCG/ML Kit Inject 1,000 mcg as directed every 30 (thirty) days. Every 2nd of the month.   divalproex 125 MG capsule Commonly known as:  DEPAKOTE SPRINKLE Take 250 mg by mouth 3 (three) times daily with meals.   donepezil 10 MG tablet Commonly known as:  ARICEPT Take 10 mg by mouth at bedtime. Per Gurney Maxin, MD   ENSURE ENLIVE PO Take 1 Bottle by mouth daily.   lidocaine 5 % Commonly known as:  LIDODERM Place 1 patch onto the skin daily. Remove & Discard patch within 12 hours or as directed by MD   memantine  5 MG tablet Commonly known as:  NAMENDA Take 5 mg by mouth 2 (two) times daily. Per Chipper Herb, NP   traMADol 50 MG tablet Commonly known as:  ULTRAM Take 50 mg by mouth every 4 (four) hours as needed.   valsartan-hydrochlorothiazide 160-12.5 MG tablet Commonly known as:  DIOVAN-HCT Take 1 tablet by mouth every morning.   venlafaxine XR 75 MG 24 hr capsule Commonly known as:  EFFEXOR-XR Take 75 mg by mouth daily.       Review of Systems  Unable to perform ROS: Dementia  Constitutional: Negative for activity change, appetite change, chills, diaphoresis and fever.  HENT: Negative for congestion, mouth sores, nosebleeds, postnasal drip, sneezing, sore throat, trouble  swallowing and voice change.   Respiratory: Negative for apnea, cough, choking, chest tightness, shortness of breath and wheezing.   Cardiovascular: Negative for chest pain, palpitations and leg swelling.  Gastrointestinal: Negative for abdominal distention, abdominal pain, constipation, diarrhea and nausea.  Genitourinary: Negative for difficulty urinating, dysuria, frequency and urgency.  Musculoskeletal: Positive for arthralgias (typical arthritis). Negative for back pain, gait problem and myalgias.  Skin: Negative for color change, pallor, rash and wound.  Neurological: Positive for weakness. Negative for dizziness, tremors, syncope, speech difficulty, numbness and headaches.  Psychiatric/Behavioral: Negative for agitation and behavioral problems.  All other systems reviewed and are negative.   Immunization History  Administered Date(s) Administered  . Influenza,inj,Quad PF,6+ Mos 04/04/2013, 04/23/2014  . Influenza-Unspecified 05/01/2012, 04/04/2013, 04/25/2014, 04/26/2015, 04/26/2016, 04/19/2017  . Tdap 07/20/2015   Pertinent  Health Maintenance Due  Topic Date Due  . DEXA SCAN  04/07/1991  . PNA vac Low Risk Adult (1 of 2 - PCV13) 04/07/1991  . INFLUENZA VACCINE  Completed   Fall Risk  08/04/2016 06/02/2016 01/29/2016 12/09/2015 09/11/2015  Falls in the past year? Yes No No No Yes  Number falls in past yr: 1 - - - 1  Comment - - - - -  Injury with Fall? No - - - Yes  Comment - - - - cracked head had to have 13 stitches  Risk Factor Category  - - - - -  Comment - - - - -  Risk for fall due to : - - - - Other (Comment)  Risk for fall due to: Comment - - - - hit head on toilet when she fell  Follow up Falls evaluation completed - - - Falls evaluation completed  Comment - - - - -   Functional Status Survey:    Vitals:   07/06/17 1447  Weight: 149 lb 4.8 oz (67.7 kg)  Height: _0  (1.473 m)   Body mass index is 31.2 kg/m. Physical Exam  Constitutional: Vital signs are  normal. She appears well-developed and well-nourished. She is active and cooperative. She does not appear ill. No distress.  HENT:  Head: Normocephalic and atraumatic.  Mouth/Throat: Uvula is midline, oropharynx is clear and moist and mucous membranes are normal. Mucous membranes are not pale, not dry and not cyanotic.  Eyes: Conjunctivae, EOM and lids are normal. Pupils are equal, round, and reactive to light.  Neck: Trachea normal, normal range of motion and full passive range of motion without pain. Neck supple. No JVD present. No tracheal deviation, no edema and no erythema present. No thyromegaly present.  Cardiovascular: Normal rate, regular rhythm, normal heart sounds, intact distal pulses and normal pulses. Exam reveals no gallop, no distant heart sounds and no friction rub.  No murmur heard. Pulses:  Dorsalis pedis pulses are 2+ on the right side, and 2+ on the left side.  No edema  Pulmonary/Chest: Effort normal and breath sounds normal. No accessory muscle usage. No respiratory distress. She has no decreased breath sounds. She has no wheezes. She has no rhonchi. She has no rales. She exhibits no tenderness.  Abdominal: Soft. Normal appearance and bowel sounds are normal. She exhibits no distension and no ascites. There is no tenderness.  Musculoskeletal: Normal range of motion. She exhibits no edema or tenderness.  Expected osteoarthritis, stiffness; Bilateral Calves soft, supple. Negative Homan's Sign. B- pedal pulses equal; generalized weakness, ambulatory with rollator  Neurological: She is alert. She has normal strength. Coordination and gait abnormal.  Skin: Skin is warm, dry and intact. She is not diaphoretic. No cyanosis. No pallor. Nails show no clubbing.  Psychiatric: Her speech is normal and behavior is normal. Judgment and thought content normal. Her affect is blunt and labile. Cognition and memory are impaired. She exhibits abnormal recent memory and abnormal remote  memory.  Nursing note and vitals reviewed.   Labs reviewed: Recent Labs    10/19/16 1525 10/29/16 0848 11/07/16 1228 05/10/17 0525  NA 136  --  136 138  K 4.0  --  4.3 3.9  CL 102  --  97* 101  CO2 25  --  33* 28  GLUCOSE 94  --  96 80  BUN 43*  --  37* 40*  CREATININE 1.38* 1.00 1.18* 0.81  CALCIUM 8.9  --  8.9 9.1  MG 2.2  --   --  2.1   Recent Labs    10/19/16 1525 11/07/16 1228 05/10/17 0525  AST 22 19 14*  ALT 13* 11* 9*  ALKPHOS 52 57 34*  BILITOT 0.6 0.7 0.6  PROT 7.0 6.9 6.4*  ALBUMIN 3.6 3.2* 3.1*   Recent Labs    10/19/16 1525 11/07/16 1228 05/10/17 0525  WBC 8.5 8.4 6.5  NEUTROABS 5.5 6.4 3.1  HGB 13.5 12.5 12.8  HCT 39.6 36.5 37.9  MCV 98.9 98.7 103.1*  PLT 318 197 246   Lab Results  Component Value Date   TSH 1.048 05/10/2017   No results found for: HGBA1C Lab Results  Component Value Date   CHOL 198 10/19/2016   HDL 60 10/19/2016   LDLCALC 123 (H) 10/19/2016   TRIG 74 10/19/2016   CHOLHDL 3.3 10/19/2016    Significant Diagnostic Results in last 30 days:  No results found.  Assessment/Plan Kaylanie was seen today for medical management of chronic issues.  Diagnoses and all orders for this visit:  Essential hypertension  Lymphocytic colitis  Gastroesophageal reflux disease without esophagitis   Above stated conditions stable  Continue current medication regimen  Continue to encourage pt participation in activities and interaction with other residents  Family/ staff Communication:   Total Time:  Documentation:  Face to Face:  Family/Phone:   Labs/tests ordered:  Not due  Medication list reviewed and assessed for continued appropriateness. Monthly medication orders reviewed and signed.  Vikki Ports, NP-C Geriatrics [facility] [phone], [place] [address] Cell Phone (Mon-Fri 8am-5pm):  [phone] On Call:  [phone] & follow prompts after 5pm &  weekends Office Phone:  [phone] Office Fax:  [phone]

## 2017-08-13 NOTE — Assessment & Plan Note (Signed)
Stable. No recent worsening of symptoms. No current medicinal treatment

## 2017-08-13 NOTE — Assessment & Plan Note (Signed)
Wears protective briefs. Staff performs good skin care.

## 2017-08-13 NOTE — Assessment & Plan Note (Signed)
Stable and controlled. No episodes of hypotension. Symptoms controlled with valsartain-hctz

## 2017-08-13 NOTE — Assessment & Plan Note (Signed)
Stable. No recent exacerbations. 

## 2017-08-13 NOTE — Progress Notes (Signed)
Location:    Nursing Home Room Number: 272Z Place of Service:  ALF [phone]) Provider:  Toni Arthurs, NP-C  Roselee Nova, MD  Patient Care Team: Roselee Nova, MD as PCP - General (Family Medicine)  Extended Emergency Contact Information Primary Emergency Contact: Sinha,[name] Address: [address]          [place], [place] [address] [place] Phone: [phone] Relation: Daughter  Code Status:  DNR Goals of care: Advanced Directive information Advanced Directives 08/10/2017  Does Patient Have a Medical Advance Directive? Yes  Type of Advance Directive Out of facility DNR (pink MOST or yellow form);Healthcare Power of Attorney  Does patient want to make changes to medical advance directive? No - Patient declined  Copy of [place] in Chart? Yes  Would patient like information on creating a medical advance directive? -     Chief Complaint  Patient presents with  . Medical Management of Chronic Issues    Routine Visit    HPI:  Pt is a 84 y.o. female seen today for medical management of chronic diseases.    Alzheimer's dementia, late onset Stable. Pt resides in the Memory Care Unit. Thriving well.  Scoliosis Stable. Pain symptoms managed with scheduled tylenol and prn tramadol.  Urge incontinence Wears protective briefs. Staff performs good skin care.   Please not pt with limited verbal ability. Unable to obtain complete ROS. Some ROS info obtained from staff and documentation.   Past Medical History:  Diagnosis Date  . Anxiety    unspecified  . Chronic back pain unk  . Depression unk  . GERD (gastroesophageal reflux disease)   . History of hiatal hernia 08/08/2011  . Hyperlipidemia, unspecified   . Hypertension   . Lymphocytic colitis   . MGUS (monoclonal gammopathy of unknown significance)   . Osteopenia   . Scoliosis    born with   . Scoliosis deformity of spine    Past Surgical History:  Procedure  Laterality Date  . ABDOMINAL HYSTERECTOMY  1973  . HERNIA REPAIR    . NECK SURGERY    . NISSEN FUNDOPLICATION      No Known Allergies  Allergies as of 08/10/2017   No Known Allergies     Medication List        Accurate as of 08/10/17 11:59 PM. Always use your most recent med list.          acetaminophen 325 MG tablet Commonly known as:  TYLENOL Take 650 mg by mouth 4 (four) times daily.   ALPRAZolam 1 MG tablet Commonly known as:  XANAX Take 0.5 tablets (0.5 mg total) by mouth every morning. 1/2 tablet   ALPRAZolam 1 MG tablet Commonly known as:  XANAX Take 1 tablet (1 mg total) by mouth at bedtime.   aspirin EC 81 MG tablet Take 81 mg by mouth daily.   B-12 1000 MCG/ML Kit Inject 1,000 mcg as directed every 30 (thirty) days. Every 2nd of the month.   divalproex 125 MG capsule Commonly known as:  DEPAKOTE SPRINKLE Take 250 mg by mouth 3 (three) times daily with meals.   donepezil 10 MG tablet Commonly known as:  ARICEPT Take 10 mg by mouth at bedtime. Per Gurney Maxin, MD   ENSURE ENLIVE PO Take 1 Bottle by mouth daily.   lidocaine 5 % Commonly known as:  LIDODERM Place 1 patch onto the skin daily. Remove & Discard patch within 12 hours or as directed  by MD   memantine 5 MG tablet Commonly known as:  NAMENDA Take 5 mg by mouth 2 (two) times daily. Per Chipper Herb, NP   traMADol 50 MG tablet Commonly known as:  ULTRAM Take 50 mg by mouth every 4 (four) hours as needed.   valsartan-hydrochlorothiazide 160-12.5 MG tablet Commonly known as:  DIOVAN-HCT Take 1 tablet by mouth every morning.   venlafaxine XR 75 MG 24 hr capsule Commonly known as:  EFFEXOR-XR Take 75 mg by mouth daily.       Review of Systems  Unable to perform ROS: Dementia  Constitutional: Negative for activity change, appetite change, chills, diaphoresis and fever.  HENT: Negative for congestion, mouth sores, nosebleeds, postnasal drip, sneezing, sore throat, trouble swallowing  and voice change.   Respiratory: Negative for apnea, cough, choking, chest tightness, shortness of breath and wheezing.   Cardiovascular: Negative for chest pain, palpitations and leg swelling.  Gastrointestinal: Negative for abdominal distention, abdominal pain, constipation, diarrhea and nausea.  Genitourinary: Negative for difficulty urinating, dysuria, frequency and urgency.  Musculoskeletal: Positive for arthralgias (typical arthritis). Negative for back pain, gait problem and myalgias.  Skin: Negative for color change, pallor, rash and wound.  Neurological: Positive for weakness. Negative for dizziness, tremors, syncope, speech difficulty, numbness and headaches.  Psychiatric/Behavioral: Negative for agitation and behavioral problems.  All other systems reviewed and are negative.   Immunization History  Administered Date(s) Administered  . Influenza,inj,Quad PF,6+ Mos 04/04/2013, 04/23/2014  . Influenza-Unspecified 05/01/2012, 04/04/2013, 04/25/2014, 04/26/2015, 04/26/2016, 04/19/2017  . Tdap 07/20/2015   Pertinent  Health Maintenance Due  Topic Date Due  . DEXA SCAN  04/07/1991  . PNA vac Low Risk Adult (1 of 2 - PCV13) 04/07/1991  . INFLUENZA VACCINE  Completed   Fall Risk  08/04/2016 06/02/2016 01/29/2016 12/09/2015 09/11/2015  Falls in the past year? Yes No No No Yes  Number falls in past yr: 1 - - - 1  Comment - - - - -  Injury with Fall? No - - - Yes  Comment - - - - cracked head had to have 13 stitches  Risk Factor Category  - - - - -  Comment - - - - -  Risk for fall due to : - - - - Other (Comment)  Risk for fall due to: Comment - - - - hit head on toilet when she fell  Follow up Falls evaluation completed - - - Falls evaluation completed  Comment - - - - -   Functional Status Survey:    Vitals:   08/10/17 1454  BP: 132/62  Pulse: 66  Resp: 18  Temp: 98 F (36.7 C)  TempSrc: Oral  SpO2: 98%  Weight: 149 lb 1.6 oz (67.6 kg)  Height: '4\' 10"'  (1.473 m)   Body  mass index is 31.16 kg/m. Physical Exam  Constitutional: Vital signs are normal. She appears well-developed and well-nourished. She is active and cooperative. She does not appear ill. No distress.  HENT:  Head: Normocephalic and atraumatic.  Mouth/Throat: Uvula is midline, oropharynx is clear and moist and mucous membranes are normal. Mucous membranes are not pale, not dry and not cyanotic.  Eyes: Conjunctivae, EOM and lids are normal. Pupils are equal, round, and reactive to light.  Neck: Trachea normal, normal range of motion and full passive range of motion without pain. Neck supple. No JVD present. No tracheal deviation, no edema and no erythema present. No thyromegaly present.  Cardiovascular: Normal rate, regular rhythm, normal heart sounds,  intact distal pulses and normal pulses. Exam reveals no gallop, no distant heart sounds and no friction rub.  No murmur heard. Pulses:      Dorsalis pedis pulses are 2+ on the right side, and 2+ on the left side.  No edema  Pulmonary/Chest: Effort normal and breath sounds normal. No accessory muscle usage. No respiratory distress. She has no decreased breath sounds. She has no wheezes. She has no rhonchi. She has no rales. She exhibits no tenderness.  Abdominal: Soft. Normal appearance and bowel sounds are normal. She exhibits no distension and no ascites. There is no tenderness.  Musculoskeletal: Normal range of motion. She exhibits no edema or tenderness.  Expected osteoarthritis, stiffness; Bilateral Calves soft, supple. Negative Homan's Sign. B- pedal pulses equal; generalized weakness; ambulatory with rollator  Neurological: She is alert. She has normal strength. Coordination and gait abnormal.  Skin: Skin is warm, dry and intact. She is not diaphoretic. No cyanosis. No pallor. Nails show no clubbing.  Psychiatric: Her speech is normal and behavior is normal. Judgment and thought content normal. Her affect is blunt and labile. Cognition and memory  are impaired. She exhibits abnormal recent memory and abnormal remote memory.  Nursing note and vitals reviewed.   Labs reviewed: Recent Labs    10/19/16 1525 10/29/16 0848 11/07/16 1228 05/10/17 0525  NA 136  --  136 138  K 4.0  --  4.3 3.9  CL 102  --  97* 101  CO2 25  --  33* 28  GLUCOSE 94  --  96 80  BUN 43*  --  37* 40*  CREATININE 1.38* 1.00 1.18* 0.81  CALCIUM 8.9  --  8.9 9.1  MG 2.2  --   --  2.1   Recent Labs    10/19/16 1525 11/07/16 1228 05/10/17 0525  AST 22 19 14*  ALT 13* 11* 9*  ALKPHOS 52 57 34*  BILITOT 0.6 0.7 0.6  PROT 7.0 6.9 6.4*  ALBUMIN 3.6 3.2* 3.1*   Recent Labs    10/19/16 1525 11/07/16 1228 05/10/17 0525  WBC 8.5 8.4 6.5  NEUTROABS 5.5 6.4 3.1  HGB 13.5 12.5 12.8  HCT 39.6 36.5 37.9  MCV 98.9 98.7 103.1*  PLT 318 197 246   Lab Results  Component Value Date   TSH 1.048 05/10/2017   No results found for: HGBA1C Lab Results  Component Value Date   CHOL 198 10/19/2016   HDL 60 10/19/2016   LDLCALC 123 (H) 10/19/2016   TRIG 74 10/19/2016   CHOLHDL 3.3 10/19/2016    Significant Diagnostic Results in last 30 days:  No results found.  Assessment/Plan Dazani was seen today for medical management of chronic issues.  Diagnoses and all orders for this visit:  Late onset Alzheimer's disease without behavioral disturbance  Scoliosis, unspecified scoliosis type, unspecified spinal region  Urge incontinence   Conditions stable  Continue current medication regimen  Pt appropriate to continue to reside on AL memory Care  Continue to encourage pt interaction with other residents and activities  Family/ staff Communication:   Total Time:  Documentation:  Face to Face:  Family/Phone:   Labs/tests ordered:  Not due  Medication list reviewed and assessed for continued appropriateness. Monthly medication orders reviewed and signed.  Vikki Ports, NP-C Geriatrics [facility] [phone], [place] [address] Cell Phone (Mon-Fri 8am-5pm):  [phone] On Call:  [phone] & follow prompts after 5pm & weekends Office Phone:  [phone] Office Fax:  [phone]

## 2017-08-24 ENCOUNTER — Other Ambulatory Visit: Payer: Self-pay

## 2017-08-24 MED ORDER — TRAMADOL HCL 50 MG PO TABS: 50.0000 mg | ORAL_TABLET | ORAL | 2 refills | Status: DC | PRN

## 2017-08-24 NOTE — Telephone Encounter (Signed)
Rx sent to [facility] phone : [phone] , fax : [phone]  

## 2017-08-26 ENCOUNTER — Encounter
Admission: RE | Admit: 2017-08-26 | Discharge: 2017-08-26 | Disposition: A | Discharge status: Home / Self Care | Payer: Medicare Other | Source: Ambulatory Visit | Attending: Internal Medicine | Admitting: Internal Medicine

## 2017-08-31 ENCOUNTER — Other Ambulatory Visit: Payer: Self-pay

## 2017-08-31 MED ORDER — ALPRAZOLAM 1 MG PO TABS: 1.0000 mg | ORAL_TABLET | Freq: Every day | ORAL | 3 refills | Status: DC

## 2017-08-31 NOTE — Telephone Encounter (Signed)
Rx sent to [facility] phone : [phone] , fax : [phone]  

## 2017-09-02 ENCOUNTER — Other Ambulatory Visit: Payer: Self-pay

## 2017-09-02 MED ORDER — ALPRAZOLAM 1 MG PO TABS: 0.5000 mg | ORAL_TABLET | ORAL | 3 refills | Status: DC

## 2017-09-02 NOTE — Telephone Encounter (Signed)
Rx sent to [facility] phone : [phone] , fax : [phone]  

## 2017-09-07 ENCOUNTER — Other Ambulatory Visit: Payer: Self-pay

## 2017-09-07 MED ORDER — ALPRAZOLAM 1 MG PO TABS: 1.0000 mg | ORAL_TABLET | Freq: Every day | ORAL | 3 refills | Status: DC

## 2017-09-07 NOTE — Telephone Encounter (Signed)
Rx sent to [facility] phone : [phone] , fax : [phone]  

## 2017-09-08 ENCOUNTER — Non-Acute Institutional Stay: Payer: Medicare Other | Admitting: Gerontology

## 2017-09-08 ENCOUNTER — Encounter: Payer: Self-pay | Admitting: Gerontology

## 2017-09-08 DIAGNOSIS — R011 Cardiac murmur, unspecified: Secondary | ICD-10-CM | POA: Diagnosis not present

## 2017-09-08 DIAGNOSIS — F0390 Unspecified dementia without behavioral disturbance: Secondary | ICD-10-CM | POA: Diagnosis not present

## 2017-09-08 DIAGNOSIS — F39 Unspecified mood [affective] disorder: Secondary | ICD-10-CM | POA: Diagnosis not present

## 2017-09-23 ENCOUNTER — Encounter
Admission: RE | Admit: 2017-09-23 | Discharge: 2017-09-23 | Disposition: A | Discharge status: Home / Self Care | Payer: Medicare Other | Source: Ambulatory Visit | Attending: Internal Medicine | Admitting: Internal Medicine

## 2017-10-03 ENCOUNTER — Ambulatory Visit (INDEPENDENT_AMBULATORY_CARE_PROVIDER_SITE_OTHER): Payer: Medicare Other | Admitting: Podiatry

## 2017-10-03 ENCOUNTER — Encounter: Payer: Self-pay | Admitting: Podiatry

## 2017-10-03 DIAGNOSIS — B351 Tinea unguium: Secondary | ICD-10-CM

## 2017-10-03 DIAGNOSIS — M79609 Pain in unspecified limb: Secondary | ICD-10-CM | POA: Diagnosis not present

## 2017-10-03 DIAGNOSIS — I739 Peripheral vascular disease, unspecified: Secondary | ICD-10-CM

## 2017-10-03 NOTE — Progress Notes (Signed)
Complaint:  Visit Type: Patient returns to my office for continued preventative foot care services. Complaint: Patient states" my nails have grown long and thick and become painful to walk and wear shoes" The patient presents for preventative foot care services. No changes to ROS  Podiatric Exam: Vascular: dorsalis pedis and posterior tibial pulses are palpable bilateral. Capillary return is immediate. Temperature gradient is WNL. Skin turgor WNL  Sensorium: Normal Semmes Weinstein monofilament test. Normal tactile sensation bilaterally. Nail Exam: Pt has thick disfigured discolored nails with subungual debris noted bilateral entire nail hallux through fifth toenails Ulcer Exam: There is no evidence of ulcer or pre-ulcerative changes or infection. Orthopedic Exam: Muscle tone and strength are WNL. No limitations in general ROM. No crepitus or effusions noted. Foot type and digits show no abnormalities. Bony prominences are unremarkable. Skin: No Porokeratosis. No infection or ulcers  Diagnosis:  Onychomycosis, , Pain in right toe, pain in left toes  Treatment & Plan Procedures and Treatment: Consent by patient was obtained for treatment procedures. The patient understood the discussion of treatment and procedures well. All questions were answered thoroughly reviewed. Debridement of mycotic and hypertrophic toenails, 1 through 5 bilateral and clearing of subungual debris. No ulceration, no infection noted. ABN signed for 2019. Return Visit-Office Procedure: Patient instructed to return to the office for a follow up visit 10 weeks  for continued evaluation and treatment.    Rashaan Wyles DPM 

## 2017-10-03 NOTE — Addendum Note (Signed)
Addended byDeidre Ala, Aurore Redinger L on: 10/03/2017 04:38 PM   Modules accepted: Orders

## 2017-10-13 ENCOUNTER — Encounter: Payer: Self-pay | Admitting: Gerontology

## 2017-10-13 ENCOUNTER — Non-Acute Institutional Stay: Payer: Medicare Other | Admitting: Gerontology

## 2017-10-13 DIAGNOSIS — Z9181 History of falling: Secondary | ICD-10-CM | POA: Diagnosis not present

## 2017-10-13 DIAGNOSIS — Z87898 Personal history of other specified conditions: Secondary | ICD-10-CM

## 2017-10-13 DIAGNOSIS — Z8719 Personal history of other diseases of the digestive system: Secondary | ICD-10-CM

## 2017-10-13 DIAGNOSIS — F330 Major depressive disorder, recurrent, mild: Secondary | ICD-10-CM | POA: Diagnosis not present

## 2017-10-17 NOTE — Assessment & Plan Note (Signed)
More stable now. On Depakote 250 mg po TID for mood stabilization with Effexor 75 mg Q Day and Xanax 0.5 mg in the am and 1 mg in the pm. Pt no longer having as many angry outbursts, crying episodes or confusion.

## 2017-10-17 NOTE — Assessment & Plan Note (Signed)
Stable. Nonconsequential. Pt denies chest pain or shortness of breath.

## 2017-10-17 NOTE — Assessment & Plan Note (Signed)
Stable. Slowly progressive. Pt is a resident on [place] unit. Pt was placed on Namenda by the Neurologist. However, this caused worsening agitation and hostility. Medication was weaned off. Was placed on allergy list d/t negative reaction. Now on Aricept 10 mg po Q HS and Depakote 250 mg TID for mood stabilization.

## 2017-10-17 NOTE — Assessment & Plan Note (Signed)
Stable. No falls recently. Ambulates with Rolator. Staff assists with ADLs as appropriate.

## 2017-10-17 NOTE — Progress Notes (Signed)
Location:    Nursing Home Room Number: 400Q Place of Service:  ALF [phone]) Provider:  Toni Arthurs, NP-C  Steele Sizer, MD  Patient Care Team: Steele Sizer, MD as PCP - General (Family Medicine)  Extended Emergency Contact Information Primary Emergency Contact: Petz,[name] Address: [address]          [place], [place] [address] [place] Phone: [phone] Relation: Daughter  Code Status:  DNR Goals of care: Advanced Directive information Advanced Directives 10/13/2017  Does Patient Have a Medical Advance Directive? Yes  Type of Paramedic of [place];Out of facility DNR (pink MOST or yellow form)  Does patient want to make changes to medical advance directive? No - Patient declined  Copy of [place] in Chart? Yes  Would patient like information on creating a medical advance directive? -  Pre-existing out of facility DNR order (yellow form or pink MOST form) Yellow form placed in chart (order not valid for inpatient use)     Chief Complaint  Patient presents with  . Medical Management of Chronic Issues    Routine Visit    HPI:  Pt is a 84 y.o. female seen today for medical management of chronic diseases.    Dementia arising in the senium and presenium Stable. Slowly progressive. Pt is a resident on [place] unit. Pt was placed on Namenda by the Neurologist. However, this caused worsening agitation and hostility. Medication was weaned off. Was placed on allergy list d/t negative reaction. Now on Aricept 10 mg po Q HS and Depakote 250 mg TID for mood stabilization.   Murmur Stable. Nonconsequential. Pt denies chest pain or shortness of breath.  Mood disorder ([place]) More stable now. On Depakote 250 mg po TID for mood stabilization with Effexor 75 mg Q Day and Xanax 0.5 mg in the am and 1 mg in the pm. Pt no longer having as many angry outbursts, crying episodes or confusion.   Please note pt with  limited verbal/cognitive ability. Unable to obtain complete ROS. Some ROS info obtained from staff and documentation.   Past Medical History:  Diagnosis Date  . Anxiety    unspecified  . Chronic back pain unk  . Depression unk  . GERD (gastroesophageal reflux disease)   . History of hiatal hernia 08/08/2011  . Hyperlipidemia, unspecified   . Hypertension   . Lymphocytic colitis   . MGUS (monoclonal gammopathy of unknown significance)   . Osteopenia   . Scoliosis    born with   . Scoliosis deformity of spine    Past Surgical History:  Procedure Laterality Date  . ABDOMINAL HYSTERECTOMY  1973  . HERNIA REPAIR    . NECK SURGERY    . NISSEN FUNDOPLICATION      No Known Allergies  Allergies as of 09/08/2017   No Known Allergies     Medication List        Accurate as of 09/08/17 11:59 PM. Always use your most recent med list.          acetaminophen 325 MG tablet Commonly known as:  TYLENOL Take 650 mg by mouth 4 (four) times daily.   ALPRAZolam 1 MG tablet Commonly known as:  XANAX Take 0.5 tablets (0.5 mg total) by mouth every morning. 1/2 tablet   ALPRAZolam 1 MG tablet Commonly known as:  XANAX Take 1 tablet (1 mg total) by mouth at bedtime. 8 pm   aspirin EC 81 MG tablet Take 81  mg by mouth See admin instructions. Daily on Monday , Wednesday, and Friday for antiplatelet   B-12 1000 MCG/ML Kit Inject 1,000 mcg as directed every 30 (thirty) days. Every 6th of the month.   divalproex 125 MG capsule Commonly known as:  DEPAKOTE SPRINKLE Take 250 mg by mouth 3 (three) times daily with meals.   donepezil 10 MG tablet Commonly known as:  ARICEPT Take 10 mg by mouth at bedtime. Per Gurney Maxin, MD   ENSURE ENLIVE PO Take 1 Bottle by mouth daily.   lidocaine 5 % Commonly known as:  LIDODERM Place 1 patch onto the skin daily. Remove & Discard patch within 12 hours or as directed by MD   memantine 10 MG tablet Commonly known as:  NAMENDA Take 5 mg by  mouth daily. 1/2 tab   ondansetron 4 MG disintegrating tablet Commonly known as:  ZOFRAN-ODT Take 4 mg by mouth every 4 (four) hours as needed for nausea or vomiting.   traMADol 50 MG tablet Commonly known as:  ULTRAM Take 1 tablet (50 mg total) by mouth every 4 (four) hours as needed.   valsartan-hydrochlorothiazide 160-12.5 MG tablet Commonly known as:  DIOVAN-HCT Take 1 tablet by mouth every morning.   venlafaxine XR 75 MG 24 hr capsule Commonly known as:  EFFEXOR-XR Take 75 mg by mouth daily.       Review of Systems  Unable to perform ROS: Dementia  Constitutional: Negative for activity change, appetite change, chills, diaphoresis and fever.  HENT: Negative for congestion, mouth sores, nosebleeds, postnasal drip, sneezing, sore throat, trouble swallowing and voice change.   Respiratory: Negative for apnea, cough, choking, chest tightness, shortness of breath and wheezing.   Cardiovascular: Negative for chest pain, palpitations and leg swelling.  Gastrointestinal: Negative for abdominal distention, abdominal pain, constipation, diarrhea and nausea.  Genitourinary: Negative for difficulty urinating, dysuria, frequency and urgency.  Musculoskeletal: Positive for arthralgias (typical arthritis), back pain, gait problem and myalgias.  Skin: Negative for color change, pallor, rash and wound.  Neurological: Positive for weakness. Negative for dizziness, tremors, syncope, speech difficulty, numbness and headaches.  Psychiatric/Behavioral: Positive for agitation and dysphoric mood. Negative for behavioral problems.  All other systems reviewed and are negative.   Immunization History  Administered Date(s) Administered  . Influenza,inj,Quad PF,6+ Mos 04/04/2013, 04/23/2014  . Influenza-Unspecified 05/01/2012, 04/04/2013, 04/25/2014, 04/26/2015, 04/26/2016, 04/19/2017  . Tdap 07/20/2015   Pertinent  Health Maintenance Due  Topic Date Due  . DEXA SCAN  04/07/1991  . PNA vac Low  Risk Adult (1 of 2 - PCV13) 04/07/1991  . INFLUENZA VACCINE  Completed   Fall Risk  08/04/2016 06/02/2016 01/29/2016 12/09/2015 09/11/2015  Falls in the past year? Yes No No No Yes  Number falls in past yr: 1 - - - 1  Comment - - - - -  Injury with Fall? No - - - Yes  Comment - - - - cracked head had to have 13 stitches  Risk Factor Category  - - - - -  Comment - - - - -  Risk for fall due to : - - - - Other (Comment)  Risk for fall due to: Comment - - - - hit head on toilet when she fell  Follow up Falls evaluation completed - - - Falls evaluation completed  Comment - - - - -   Functional Status Survey:    Vitals:   09/08/17 1414  BP: 132/62  Pulse: 66  Resp: 18  Temp: (!) 96.6  F (35.9 C)  TempSrc: Oral  SpO2: 98%  Weight: 151 lb 11.2 oz (68.8 kg)  Height: '4\' 10"'  (1.473 m)   Body mass index is 31.71 kg/m. Physical Exam  Constitutional: Vital signs are normal. She appears well-developed and well-nourished. She is active and cooperative. She does not appear ill. No distress.  HENT:  Head: Normocephalic and atraumatic.  Mouth/Throat: Uvula is midline, oropharynx is clear and moist and mucous membranes are normal. Mucous membranes are not pale, not dry and not cyanotic.  Eyes: Pupils are equal, round, and reactive to light. Conjunctivae, EOM and lids are normal.  Neck: Trachea normal, normal range of motion and full passive range of motion without pain. Neck supple. No JVD present. No tracheal deviation, no edema and no erythema present. No thyromegaly present.  Cardiovascular: Normal rate, regular rhythm, intact distal pulses and normal pulses. Exam reveals no gallop, no distant heart sounds and no friction rub.  Murmur heard. Pulses:      Dorsalis pedis pulses are 2+ on the right side, and 2+ on the left side.  No edema  Pulmonary/Chest: Effort normal and breath sounds normal. No accessory muscle usage. No respiratory distress. She has no decreased breath sounds. She has no  wheezes. She has no rhonchi. She has no rales. She exhibits no tenderness.  Abdominal: Soft. Normal appearance and bowel sounds are normal. She exhibits no distension and no ascites. There is no tenderness.  Musculoskeletal: Normal range of motion. She exhibits no edema or tenderness.  Expected osteoarthritis, stiffness; Bilateral Calves soft, supple. Negative Homan's Sign. B- pedal pulses equal; generalized weakness, ambulatory with rolator  Neurological: She is alert. She has normal strength. She displays atrophy. A cranial nerve deficit and sensory deficit is present. She exhibits abnormal muscle tone. Coordination and gait abnormal.  Skin: Skin is warm, dry and intact. She is not diaphoretic. No cyanosis. No pallor. Nails show no clubbing.  Psychiatric: Her speech is normal. Thought content normal. Her affect is angry (at times) and blunt. She is agitated and withdrawn. Cognition and memory are impaired. She exhibits a depressed mood. She exhibits abnormal recent memory and abnormal remote memory.  Nursing note and vitals reviewed.   Labs reviewed: Recent Labs    10/19/16 1525 10/29/16 0848 11/07/16 1228 05/10/17 0525  NA 136  --  136 138  K 4.0  --  4.3 3.9  CL 102  --  97* 101  CO2 25  --  33* 28  GLUCOSE 94  --  96 80  BUN 43*  --  37* 40*  CREATININE 1.38* 1.00 1.18* 0.81  CALCIUM 8.9  --  8.9 9.1  MG 2.2  --   --  2.1   Recent Labs    10/19/16 1525 11/07/16 1228 05/10/17 0525  AST 22 19 14*  ALT 13* 11* 9*  ALKPHOS 52 57 34*  BILITOT 0.6 0.7 0.6  PROT 7.0 6.9 6.4*  ALBUMIN 3.6 3.2* 3.1*   Recent Labs    10/19/16 1525 11/07/16 1228 05/10/17 0525  WBC 8.5 8.4 6.5  NEUTROABS 5.5 6.4 3.1  HGB 13.5 12.5 12.8  HCT 39.6 36.5 37.9  MCV 98.9 98.7 103.1*  PLT 318 197 246   Lab Results  Component Value Date   TSH 1.048 05/10/2017   No results found for: HGBA1C Lab Results  Component Value Date   CHOL 198 10/19/2016   HDL 60 10/19/2016   LDLCALC 123 (H)  10/19/2016   TRIG 74 10/19/2016  CHOLHDL 3.3 10/19/2016    Significant Diagnostic Results in last 30 days:  No results found.  Assessment/Plan Anokhi was seen today for medical management of chronic issues.  Diagnoses and all orders for this visit:  Dementia arising in the senium and presenium  Murmur  Mood disorder ([place])   above listed conditions stable  Continue current medication regimen  Continue to encourage pt interaction with other resident and participation in activities  Encourage pt to do outside with the others as much as tolerated  Monitor for chest pains or shortness of breath  Family/ staff Communication:   Total Time:  Documentation:  Face to Face:  Family/Phone:   Labs/tests ordered:  Not due  Medication list reviewed and assessed for continued appropriateness. Monthly medication orders reviewed and signed.  Vikki Ports, NP-C Geriatrics [facility] [phone] [place], [place] [address] Cell Phone (Mon-Fri 8am-5pm):  [phone] On Call:  [phone] & follow prompts after 5pm & weekends Office Phone:  [phone] Office Fax:  [phone]

## 2017-10-17 NOTE — Assessment & Plan Note (Signed)
Resolved. No episodes of syncope lately

## 2017-10-17 NOTE — Assessment & Plan Note (Signed)
Stable. Pt seems less depressed and less irritable as of late. Pt symptoms managed with Effexor, scheduled Xanax and Depakote 250 mg TID.

## 2017-10-17 NOTE — Progress Notes (Signed)
Location:    Nursing Home Room Number: 638G Place of Service:  ALF [phone]) Provider:  Toni Arthurs, NP-C  Steele Sizer, MD  Patient Care Team: Steele Sizer, MD as PCP - General (Family Medicine)  Extended Emergency Contact Information Primary Emergency Contact: Badley,[name] Address: [address]          [place], [place] [address] [place] Phone: [phone] Relation: Daughter  Code Status:  DNR Goals of care: Advanced Directive information Advanced Directives 10/13/2017  Does Patient Have a Medical Advance Directive? Yes  Type of Paramedic of [place];Out of facility DNR (pink MOST or yellow form)  Does patient want to make changes to medical advance directive? No - Patient declined  Copy of [place] in Chart? Yes  Would patient like information on creating a medical advance directive? -  Pre-existing out of facility DNR order (yellow form or pink MOST form) Yellow form placed in chart (order not valid for inpatient use)     Chief Complaint  Patient presents with  . Medical Management of Chronic Issues    Routine Visit    HPI:  Cindy Estes is a 84 y.o. female seen today for medical management of chronic diseases.    Mild episode of recurrent major depressive disorder (HCC) Stable. Cindy Estes seems less depressed and less irritable as of late. Cindy Estes symptoms managed with Effexor, scheduled Xanax and Depakote 250 mg TID.   Hx of falling Stable. No falls recently. Ambulates with Rolator. Staff assists with ADLs as appropriate.   History of syncope Resolved. No episodes of syncope lately  History of hiatal hernia Stable. No current c/o reflux, difficulty swallowing, etc. Not on medications for GERD.   Please note Cindy Estes with limited verbal/cognitive ability. Unable to obtain complete ROS. Some ROS info obtained from staff and documentation.   Past Medical History:  Diagnosis Date  . Anxiety    unspecified  . Chronic  back pain unk  . Depression unk  . GERD (gastroesophageal reflux disease)   . History of hiatal hernia 08/08/2011  . Hyperlipidemia, unspecified   . Hypertension   . Lymphocytic colitis   . MGUS (monoclonal gammopathy of unknown significance)   . Osteopenia   . Scoliosis    born with   . Scoliosis deformity of spine    Past Surgical History:  Procedure Laterality Date  . ABDOMINAL HYSTERECTOMY  1973  . HERNIA REPAIR    . NECK SURGERY    . NISSEN FUNDOPLICATION      No Known Allergies  Allergies as of 10/13/2017   No Known Allergies     Medication List        Accurate as of 10/13/17 11:59 PM. Always use your most recent med list.          acetaminophen 325 MG tablet Commonly known as:  TYLENOL Take 650 mg by mouth 4 (four) times daily.   ALPRAZolam 1 MG tablet Commonly known as:  XANAX Take 0.5 mg by mouth every morning. 1/2 tab   ALPRAZolam 1 MG tablet Commonly known as:  XANAX Take 1 mg by mouth at bedtime.   aspirin EC 81 MG tablet Take 81 mg by mouth See admin instructions. Daily on Monday , Wednesday, and Friday for antiplatelet   B-12 1000 MCG/ML Kit Inject 1,000 mcg as directed every 30 (thirty) days. Every 6th of the month.   divalproex 125 MG capsule Commonly known as:  DEPAKOTE SPRINKLE Take 250 mg by mouth  3 (three) times daily with meals.   donepezil 10 MG tablet Commonly known as:  ARICEPT Take 10 mg by mouth at bedtime. Per Gurney Maxin, MD   ENSURE ENLIVE PO Take 1 Bottle by mouth daily.   lidocaine 5 % Commonly known as:  LIDODERM Place 1 patch onto the skin daily. Remove & Discard patch within 12 hours or as directed by MD   memantine 10 MG tablet Commonly known as:  NAMENDA Take 5 mg by mouth daily. 1/2 tab   ondansetron 4 MG disintegrating tablet Commonly known as:  ZOFRAN-ODT Take 4 mg by mouth every 4 (four) hours as needed for nausea or vomiting.   traMADol 50 MG tablet Commonly known as:  ULTRAM Take 1 tablet (50 mg  total) by mouth every 4 (four) hours as needed.   valsartan-hydrochlorothiazide 160-12.5 MG tablet Commonly known as:  DIOVAN-HCT Take 1 tablet by mouth every morning.   venlafaxine XR 75 MG 24 hr capsule Commonly known as:  EFFEXOR-XR Take 75 mg by mouth daily.       Review of Systems  Unable to perform ROS: Dementia  Constitutional: Negative for activity change, appetite change, chills, diaphoresis and fever.  HENT: Negative for congestion, mouth sores, nosebleeds, postnasal drip, sneezing, sore throat, trouble swallowing and voice change.   Respiratory: Negative for apnea, cough, choking, chest tightness, shortness of breath and wheezing.   Cardiovascular: Negative for chest pain, palpitations and leg swelling.  Gastrointestinal: Negative for abdominal distention, abdominal pain, constipation, diarrhea and nausea.  Genitourinary: Negative for difficulty urinating, dysuria, frequency and urgency.  Musculoskeletal: Positive for arthralgias (typical arthritis), back pain and gait problem. Negative for myalgias.  Skin: Negative for color change, pallor, rash and wound.  Neurological: Positive for weakness. Negative for dizziness, tremors, syncope, speech difficulty, numbness and headaches.  Psychiatric/Behavioral: Positive for agitation, confusion and dysphoric mood. Negative for behavioral problems.  All other systems reviewed and are negative.   Immunization History  Administered Date(s) Administered  . Influenza,inj,Quad PF,6+ Mos 04/04/2013, 04/23/2014  . Influenza-Unspecified 05/01/2012, 04/04/2013, 04/25/2014, 04/26/2015, 04/26/2016, 04/19/2017  . Tdap 07/20/2015   Pertinent  Health Maintenance Due  Topic Date Due  . DEXA SCAN  04/07/1991  . PNA vac Low Risk Adult (1 of 2 - PCV13) 04/07/1991  . INFLUENZA VACCINE  Completed   Fall Risk  08/04/2016 06/02/2016 01/29/2016 12/09/2015 09/11/2015  Falls in the past year? Yes No No No Yes  Number falls in past yr: 1 - - - 1  Comment  - - - - -  Injury with Fall? No - - - Yes  Comment - - - - cracked head had to have 13 stitches  Risk Factor Category  - - - - -  Comment - - - - -  Risk for fall due to : - - - - Other (Comment)  Risk for fall due to: Comment - - - - hit head on toilet when she fell  Follow up Falls evaluation completed - - - Falls evaluation completed  Comment - - - - -   Functional Status Survey:    Vitals:   10/13/17 1408  Weight: 148 lb 8 oz (67.4 kg)  Height: _0  (1.473 m)   Body mass index is 31.04 kg/m. Physical Exam  Constitutional: Vital signs are normal. She appears well-developed and well-nourished. She is active and cooperative. She does not appear ill. No distress.  HENT:  Head: Normocephalic and atraumatic.  Mouth/Throat: Uvula is midline, oropharynx is clear  and moist and mucous membranes are normal. Mucous membranes are not pale, not dry and not cyanotic.  Eyes: Pupils are equal, round, and reactive to light. Conjunctivae, EOM and lids are normal.  Neck: Trachea normal, normal range of motion and full passive range of motion without pain. Neck supple. No JVD present. No tracheal deviation, no edema and no erythema present. No thyromegaly present.  Cardiovascular: Normal rate, regular rhythm, intact distal pulses and normal pulses. Exam reveals no gallop, no distant heart sounds and no friction rub.  Murmur heard. Pulses:      Dorsalis pedis pulses are 2+ on the right side, and 2+ on the left side.  No edema  Pulmonary/Chest: Effort normal and breath sounds normal. No accessory muscle usage. No respiratory distress. She has no decreased breath sounds. She has no wheezes. She has no rhonchi. She has no rales. She exhibits no tenderness.  Abdominal: Soft. Normal appearance and bowel sounds are normal. She exhibits no distension and no ascites. There is no tenderness.  Musculoskeletal: Normal range of motion. She exhibits no edema or tenderness.  Expected osteoarthritis, stiffness;  Bilateral Calves soft, supple. Negative Homan's Sign. B- pedal pulses equal; generalized weakness, ambulates with rolator  Neurological: She is alert. She has normal strength. She displays atrophy. A cranial nerve deficit and sensory deficit is present. She exhibits abnormal muscle tone. Coordination and gait abnormal.  Skin: Skin is warm, dry and intact. She is not diaphoretic. No cyanosis. No pallor. Nails show no clubbing.  Psychiatric: Her speech is normal. Thought content normal. Her mood appears anxious. Her affect is angry (at times) and blunt. She is agitated (at times) and withdrawn. Cognition and memory are impaired. She expresses impulsivity. She exhibits a depressed mood. She exhibits abnormal recent memory and abnormal remote memory.  Nursing note and vitals reviewed.   Labs reviewed: Recent Labs    10/19/16 1525 10/29/16 0848 11/07/16 1228 05/10/17 0525  NA 136  --  136 138  K 4.0  --  4.3 3.9  CL 102  --  97* 101  CO2 25  --  33* 28  GLUCOSE 94  --  96 80  BUN 43*  --  37* 40*  CREATININE 1.38* 1.00 1.18* 0.81  CALCIUM 8.9  --  8.9 9.1  MG 2.2  --   --  2.1   Recent Labs    10/19/16 1525 11/07/16 1228 05/10/17 0525  AST 22 19 14*  ALT 13* 11* 9*  ALKPHOS 52 57 34*  BILITOT 0.6 0.7 0.6  PROT 7.0 6.9 6.4*  ALBUMIN 3.6 3.2* 3.1*   Recent Labs    10/19/16 1525 11/07/16 1228 05/10/17 0525  WBC 8.5 8.4 6.5  NEUTROABS 5.5 6.4 3.1  HGB 13.5 12.5 12.8  HCT 39.6 36.5 37.9  MCV 98.9 98.7 103.1*  PLT 318 197 246   Lab Results  Component Value Date   TSH 1.048 05/10/2017   No results found for: HGBA1C Lab Results  Component Value Date   CHOL 198 10/19/2016   HDL 60 10/19/2016   LDLCALC 123 (H) 10/19/2016   TRIG 74 10/19/2016   CHOLHDL 3.3 10/19/2016    Significant Diagnostic Results in last 30 days:  No results found.  Assessment/Plan Cindy Estes was seen today for medical management of chronic issues.  Diagnoses and all orders for this visit:  Mild  episode of recurrent major depressive disorder ([place])  Hx of falling  History of syncope  History of hiatal hernia  Above listed conditions stable  Continue current medication regimen  Routine labs to be checked next month  Monitor for difficulty eating, swallowing or symptoms of reflux  Monitor for falls  Safety precautions  Assist with ADLs as appropriate  Monitor for worsening symptoms of depression   Encourage interaction with other residents  Family/ staff Communication:   Total Time:  Documentation:  Face to Face:  Family/Phone:   Labs/tests ordered:  Not due yet  Medication list reviewed and assessed for continued appropriateness. Monthly medication orders reviewed and signed.  Vikki Ports, NP-C Geriatrics [facility] [phone] [place],  [address] Cell Phone (Mon-Fri 8am-5pm):  [phone] On Call:  [phone] & follow prompts after 5pm & weekends Office Phone:  [phone] Office Fax:  [phone]

## 2017-10-17 NOTE — Assessment & Plan Note (Signed)
Stable. No current c/o reflux, difficulty swallowing, etc. Not on medications for GERD.

## 2017-10-24 ENCOUNTER — Encounter
Admission: RE | Admit: 2017-10-24 | Discharge: 2017-10-24 | Disposition: A | Discharge status: Home / Self Care | Payer: Medicare Other | Source: Ambulatory Visit | Attending: Internal Medicine | Admitting: Internal Medicine

## 2017-11-10 ENCOUNTER — Non-Acute Institutional Stay: Payer: Medicare Other | Admitting: Gerontology

## 2017-11-10 ENCOUNTER — Encounter: Payer: Self-pay | Admitting: Gerontology

## 2017-11-10 DIAGNOSIS — E538 Deficiency of other specified B group vitamins: Secondary | ICD-10-CM

## 2017-11-10 DIAGNOSIS — R0789 Other chest pain: Secondary | ICD-10-CM | POA: Diagnosis not present

## 2017-11-10 DIAGNOSIS — R600 Localized edema: Secondary | ICD-10-CM

## 2017-11-10 NOTE — Progress Notes (Signed)
Location:    Nursing Home Room Number: 250 Place of Service:  ALF [phone]) Provider:  Toni Arthurs, NP-C  Steele Sizer, MD  Patient Care Team: Steele Sizer, MD as PCP - General (Family Medicine)  Extended Emergency Contact Information Primary Emergency Contact: Cindy Estes Address: [address]          [place], [place] [address] [place] Phone: [phone] Relation: Daughter  Code Status:  DNR Goals of care: Advanced Directive information Advanced Directives 11/10/2017  Does Patient Have a Medical Advance Directive? Yes  Type of Paramedic of [place];Out of facility DNR (pink MOST or yellow form)  Does patient want to make changes to medical advance directive? No - Patient declined  Copy of [place] in Chart? Yes  Would patient like information on creating a medical advance directive? -  Pre-existing out of facility DNR order (yellow form or pink MOST form) Yellow form placed in chart (order not valid for inpatient use)     Chief Complaint  Patient presents with  . Medical Management of Chronic Issues    Routine Visit    HPI:  Pt is a 84 y.o. female seen today for medical management of chronic diseases.    Deficiency of other specified B group vitamins Stable. Pt continues to receive 1,000 mcg IM cyanocobalamin Q 30 days. Last level 685. Will get new levels this month.  Bilateral edema of lower extremity Stable. BLE non-pitting edema. No c/o pain or discomfort. Not on diuretics.  Atypical chest pain Stable. No recent complaints of chest pain or shortness of breath.   Please note pt with limited verbal/cognitive ability. Unable to obtain complete ROS. Some ROS info obtained from staff and documentation.   Past Medical History:  Diagnosis Date  . Anxiety    unspecified  . Chronic back pain unk  . Depression unk  . GERD (gastroesophageal reflux disease)   . History of hiatal hernia 08/08/2011    . Hyperlipidemia, unspecified   . Hypertension   . Lymphocytic colitis   . MGUS (monoclonal gammopathy of unknown significance)   . Osteopenia   . Scoliosis    born with   . Scoliosis deformity of spine    Past Surgical History:  Procedure Laterality Date  . ABDOMINAL HYSTERECTOMY  1973  . HERNIA REPAIR    . NECK SURGERY    . NISSEN FUNDOPLICATION      No Known Allergies  Allergies as of 11/10/2017   No Known Allergies     Medication List        Accurate as of 11/10/17  4:47 PM. Always use your most recent med list.          acetaminophen 325 MG tablet Commonly known as:  TYLENOL Take 650 mg by mouth 4 (four) times daily.   ALPRAZolam 1 MG tablet Commonly known as:  XANAX Take 0.5 mg by mouth every morning. 1/2 tab   ALPRAZolam 1 MG tablet Commonly known as:  XANAX Take 1 mg by mouth at bedtime.   aspirin EC 81 MG tablet Take 81 mg by mouth See admin instructions. Daily on Monday , Wednesday, and Friday for antiplatelet   B-12 1000 MCG/ML Kit Inject 1,000 mcg as directed every 30 (thirty) days. Every 6th of the month.   divalproex 125 MG capsule Commonly known as:  DEPAKOTE SPRINKLE Take 250 mg by mouth 3 (three) times daily with meals.   donepezil 10 MG tablet Commonly known as:  ARICEPT Take 10 mg by mouth at bedtime. Per Gurney Maxin, MD   ENSURE ENLIVE PO Take 1 Bottle by mouth daily.   lidocaine 5 % Commonly known as:  LIDODERM Place 1 patch onto the skin daily. Remove & Discard patch within 12 hours or as directed by MD   memantine 10 MG tablet Commonly known as:  NAMENDA Take 5 mg by mouth daily. 1/2 tab   ondansetron 4 MG disintegrating tablet Commonly known as:  ZOFRAN-ODT Take 4 mg by mouth every 4 (four) hours as needed for nausea or vomiting.   traMADol 50 MG tablet Commonly known as:  ULTRAM Take 1 tablet (50 mg total) by mouth every 4 (four) hours as needed.   valsartan-hydrochlorothiazide 160-12.5 MG tablet Commonly known  as:  DIOVAN-HCT Take 1 tablet by mouth every morning.   venlafaxine XR 75 MG 24 hr capsule Commonly known as:  EFFEXOR-XR Take 75 mg by mouth daily.       Review of Systems  Unable to perform ROS: Dementia  Constitutional: Negative for activity change, appetite change, chills, diaphoresis and fever.  HENT: Negative for congestion, mouth sores, nosebleeds, postnasal drip, sneezing, sore throat, trouble swallowing and voice change.   Respiratory: Negative for apnea, cough, choking, chest tightness, shortness of breath and wheezing.   Cardiovascular: Negative for chest pain, palpitations and leg swelling.  Gastrointestinal: Negative for abdominal distention, abdominal pain, constipation, diarrhea and nausea.  Genitourinary: Negative for difficulty urinating, dysuria, frequency and urgency.  Musculoskeletal: Positive for arthralgias (typical arthritis) and gait problem. Negative for back pain and myalgias.  Skin: Negative for color change, pallor, rash and wound.  Neurological: Positive for weakness. Negative for dizziness, tremors, syncope, speech difficulty, numbness and headaches.  Psychiatric/Behavioral: Negative for agitation and behavioral problems.  All other systems reviewed and are negative.   Immunization History  Administered Date(s) Administered  . Influenza,inj,Quad PF,6+ Mos 04/04/2013, 04/23/2014  . Influenza-Unspecified 05/01/2012, 04/04/2013, 04/25/2014, 04/26/2015, 04/26/2016, 04/19/2017  . Tdap 07/20/2015   Pertinent  Health Maintenance Due  Topic Date Due  . DEXA SCAN  04/07/1991  . PNA vac Low Risk Adult (1 of 2 - PCV13) 04/07/1991  . INFLUENZA VACCINE  02/23/2018   Fall Risk  08/04/2016 06/02/2016 01/29/2016 12/09/2015 09/11/2015  Falls in the past year? Yes No No No Yes  Number falls in past yr: 1 - - - 1  Comment - - - - -  Injury with Fall? No - - - Yes  Comment - - - - cracked head had to have 13 stitches  Risk Factor Category  - - - - -  Comment - - - - -    Risk for fall due to : - - - - Other (Comment)  Risk for fall due to: Comment - - - - hit head on toilet when she fell  Follow up Falls evaluation completed - - - Falls evaluation completed  Comment - - - - -   Functional Status Survey:    Vitals:   11/10/17 1313  BP: (!) 134/58  Pulse: 72  Resp: 18  Temp: (!) 97 F (36.1 C)  TempSrc: Oral  SpO2: 100%  Weight: 149 lb 6.4 oz (67.8 kg)  Height: 4' 10" (1.473 m)   Body mass index is 31.22 kg/m. Physical Exam  Constitutional: She is oriented to person, place, and time. Vital signs are normal. She appears well-developed and well-nourished. She is active and cooperative. She does not appear ill. No distress.  HENT:  Head: Normocephalic and atraumatic.  Mouth/Throat: Uvula is midline, oropharynx is clear and moist and mucous membranes are normal. Mucous membranes are not pale, not dry and not cyanotic.  Eyes: Pupils are equal, round, and reactive to light. Conjunctivae, EOM and lids are normal.  Neck: Trachea normal, normal range of motion and full passive range of motion without pain. Neck supple. No JVD present. No tracheal deviation, no edema and no erythema present. No thyromegaly present.  Cardiovascular: Normal rate, regular rhythm, normal heart sounds, intact distal pulses and normal pulses. Exam reveals no gallop, no distant heart sounds and no friction rub.  No murmur heard. Pulses:      Dorsalis pedis pulses are 2+ on the right side, and 2+ on the left side.  BLE non-pitting edema  Pulmonary/Chest: Effort normal and breath sounds normal. No accessory muscle usage. No respiratory distress. She has no decreased breath sounds. She has no wheezes. She has no rhonchi. She has no rales. She exhibits no tenderness.  Abdominal: Soft. Normal appearance and bowel sounds are normal. She exhibits no distension and no ascites. There is no tenderness.  Musculoskeletal: Normal range of motion. She exhibits no edema or tenderness.  Expected  osteoarthritis, stiffness; Bilateral Calves soft, supple. Negative Homan's Sign. B- pedal pulses equal; generalized weakness. Ambulatory with Rolator  Neurological: She is alert and oriented to person, place, and time. She has normal strength. She exhibits abnormal muscle tone. Coordination abnormal.  Skin: Skin is warm, dry and intact. She is not diaphoretic. No cyanosis. No pallor. Nails show no clubbing.  Psychiatric: Her speech is normal. Judgment and thought content normal. Her affect is blunt. She is withdrawn. Cognition and memory are impaired. She exhibits a depressed mood. She exhibits abnormal recent memory.  Nursing note and vitals reviewed.   Labs reviewed: Recent Labs    05/10/17 0525  NA 138  K 3.9  CL 101  CO2 28  GLUCOSE 80  BUN 40*  CREATININE 0.81  CALCIUM 9.1  MG 2.1   Recent Labs    05/10/17 0525  AST 14*  ALT 9*  ALKPHOS 34*  BILITOT 0.6  PROT 6.4*  ALBUMIN 3.1*   Recent Labs    05/10/17 0525  WBC 6.5  NEUTROABS 3.1  HGB 12.8  HCT 37.9  MCV 103.1*  PLT 246   Lab Results  Component Value Date   TSH 1.048 05/10/2017   No results found for: HGBA1C Lab Results  Component Value Date   CHOL 198 10/19/2016   HDL 60 10/19/2016   LDLCALC 123 (H) 10/19/2016   TRIG 74 10/19/2016   CHOLHDL 3.3 10/19/2016    Significant Diagnostic Results in last 30 days:  No results found.  Assessment/Plan Cindy Estes was seen today for medical management of chronic issues.  Diagnoses and all orders for this visit:  Deficiency of other specified B group vitamins  Bilateral edema of lower extremity  Atypical chest pain   Above listed conditions stable  Continue current medication regimen  Continue to encourage pt interactions with other residents and participation in activities  Ambulate daily  Safety precautions  Fall precautions  Labs   Family/ staff Communication:   Total Time:  Documentation:  Face to  Face:  Family/Phone:   Labs/tests ordered:  Cbc, met c, mag+, tsh, B12, D, free depakote level  Medication list reviewed and assessed for continued appropriateness. Monthly medication orders reviewed and signed.  Cindy Ports, NP-C Geriatrics [facility] [phone]  Cindy Estes, [place] [address] Cell Phone (Mon-Fri 8am-5pm):  [phone] On Call:  [phone] & follow prompts after 5pm & weekends Office Phone:  [phone] Office Fax:  [phone]

## 2017-11-10 NOTE — Assessment & Plan Note (Signed)
Stable. BLE non-pitting edema. No c/o pain or discomfort. Not on diuretics.

## 2017-11-10 NOTE — Assessment & Plan Note (Signed)
Stable. Pt continues to receive 1,000 mcg IM cyanocobalamin Q 30 days. Last level 685. Will get new levels this month.

## 2017-11-10 NOTE — Assessment & Plan Note (Signed)
Stable. No recent complaints of chest pain or shortness of breath.

## 2017-11-14 ENCOUNTER — Other Ambulatory Visit
Admission: RE | Admit: 2017-11-14 | Discharge: 2017-11-14 | Disposition: A | Discharge status: Home / Self Care | Payer: Medicare Other | Source: Ambulatory Visit | Attending: Gerontology | Admitting: Gerontology

## 2017-11-14 DIAGNOSIS — F0280 Dementia in other diseases classified elsewhere without behavioral disturbance: Secondary | ICD-10-CM | POA: Insufficient documentation

## 2017-11-14 LAB — CBC WITH DIFFERENTIAL/PLATELET
BAND NEUTROPHILS: 0 %
BASOS ABS: 0 10*3/uL (ref 0–0.1)
BASOS PCT: 0 %
BLASTS: 0 %
EOS ABS: 0 10*3/uL (ref 0–0.7)
Eosinophils Relative: 0 %
HCT: 38.5 % (ref 35.0–47.0)
HEMOGLOBIN: 13 g/dL (ref 12.0–16.0)
LYMPHS PCT: 49 %
Lymphs Abs: 3.2 10*3/uL (ref 1.0–3.6)
MCH: 35.2 pg — AB (ref 26.0–34.0)
MCHC: 33.7 g/dL (ref 32.0–36.0)
MCV: 104.7 fL — ABNORMAL HIGH (ref 80.0–100.0)
METAMYELOCYTES PCT: 0 %
MONO ABS: 0.4 10*3/uL (ref 0.2–0.9)
MONOS PCT: 7 %
Myelocytes: 0 %
NEUTROS ABS: 2.8 10*3/uL (ref 1.4–6.5)
Neutrophils Relative %: 44 %
OTHER: 0 %
PLATELETS: 208 10*3/uL (ref 150–440)
Promyelocytes Relative: 0 %
RBC: 3.68 MIL/uL — ABNORMAL LOW (ref 3.80–5.20)
RDW: 14 % (ref 11.5–14.5)
WBC: 6.4 10*3/uL (ref 3.6–11.0)
nRBC: 0 /100 WBC

## 2017-11-14 LAB — COMPREHENSIVE METABOLIC PANEL
ALT: 7 U/L — AB (ref 14–54)
ANION GAP: 9 (ref 5–15)
AST: 15 U/L (ref 15–41)
Albumin: 3.1 g/dL — ABNORMAL LOW (ref 3.5–5.0)
Alkaline Phosphatase: 31 U/L — ABNORMAL LOW (ref 38–126)
BUN: 29 mg/dL — ABNORMAL HIGH (ref 6–20)
CHLORIDE: 101 mmol/L (ref 101–111)
CO2: 28 mmol/L (ref 22–32)
CREATININE: 0.63 mg/dL (ref 0.44–1.00)
Calcium: 8.7 mg/dL — ABNORMAL LOW (ref 8.9–10.3)
Glucose, Bld: 70 mg/dL (ref 65–99)
Potassium: 3.7 mmol/L (ref 3.5–5.1)
SODIUM: 138 mmol/L (ref 135–145)
Total Bilirubin: 0.6 mg/dL (ref 0.3–1.2)
Total Protein: 5.9 g/dL — ABNORMAL LOW (ref 6.5–8.1)

## 2017-11-14 LAB — VALPROIC ACID LEVEL: Valproic Acid Lvl: 27 ug/mL — ABNORMAL LOW (ref 50.0–100.0)

## 2017-11-14 LAB — TSH: TSH: 1.342 u[IU]/mL (ref 0.350–4.500)

## 2017-11-14 LAB — MAGNESIUM: MAGNESIUM: 2 mg/dL (ref 1.7–2.4)

## 2017-11-14 LAB — VITAMIN B12: VITAMIN B 12: 453 pg/mL (ref 180–914)

## 2017-11-15 LAB — VITAMIN D 25 HYDROXY (VIT D DEFICIENCY, FRACTURES): Vit D, 25-Hydroxy: 33.3 ng/mL (ref 30.0–100.0)

## 2017-11-23 ENCOUNTER — Encounter
Admission: RE | Admit: 2017-11-23 | Discharge: 2017-11-23 | Disposition: A | Discharge status: Home / Self Care | Payer: Medicare Other | Source: Ambulatory Visit | Attending: Internal Medicine | Admitting: Internal Medicine

## 2017-12-09 DIAGNOSIS — R05 Cough: Secondary | ICD-10-CM | POA: Diagnosis not present

## 2017-12-12 ENCOUNTER — Ambulatory Visit: Payer: Medicare Other | Admitting: Podiatry

## 2017-12-13 ENCOUNTER — Encounter: Payer: Self-pay | Admitting: Gerontology

## 2017-12-13 ENCOUNTER — Non-Acute Institutional Stay: Payer: Medicare Other | Admitting: Gerontology

## 2017-12-13 DIAGNOSIS — J189 Pneumonia, unspecified organism: Secondary | ICD-10-CM

## 2017-12-13 DIAGNOSIS — I10 Essential (primary) hypertension: Secondary | ICD-10-CM

## 2017-12-13 DIAGNOSIS — F419 Anxiety disorder, unspecified: Secondary | ICD-10-CM

## 2017-12-19 DIAGNOSIS — J189 Pneumonia, unspecified organism: Secondary | ICD-10-CM | POA: Insufficient documentation

## 2017-12-19 NOTE — Assessment & Plan Note (Signed)
Stable on current medication regimen. Sx worsened recently but sx change correlates with pt being placed on Namenda by Neurologist. Medication weaned/ DC'ed. Pt is pleasant and much less irritable. Stable on Venlafaxine 75 mg po Q Day, Depakote 250 mg TID, Xanax 0.5 mg Q AM and 1 mg Q HS.

## 2017-12-19 NOTE — Assessment & Plan Note (Signed)
Stable. B/Ps controlled- no recent episodes of hypo or hypertension. Sx controlled with valsartan-HCTZ 160-12.5 mg po Q Day

## 2017-12-19 NOTE — Assessment & Plan Note (Signed)
Resolving. Pt was started on Augmentin 875-125 mg Q 12 hours x 7 days as verbal order d/t CXR results obtained in the evening. Pt was having cough, congestion, malaise. Mildly productive cough. Now, lungs clear. No c/o cough or congestion. Participating in activities, etc. Afebrile.

## 2017-12-19 NOTE — Progress Notes (Signed)
Location:    Nursing Home Room Number: 250 Place of Service:  ALF [phone]) Provider:  Toni Arthurs, NP-C  Kirk Ruths, MD  Patient Care Team: Kirk Ruths, MD as PCP - General (Internal Medicine) Toni Arthurs, NP as Nurse Practitioner [facility] Medicine)  Extended Emergency Contact Information Primary Emergency Contact: Sagona,[name] Address: [address]          [place], [place] [address] [place] Phone: [phone] Relation: Daughter  Code Status:  DNR Goals of care: Advanced Directive information Advanced Directives 11/10/2017  Does Patient Have a Medical Advance Directive? Yes  Type of Paramedic of [place];Out of facility DNR (pink MOST or yellow form)  Does patient want to make changes to medical advance directive? No - Patient declined  Copy of [place] in Chart? Yes  Would patient like information on creating a medical advance directive? -  Pre-existing out of facility DNR order (yellow form or pink MOST form) Yellow form placed in chart (order not valid for inpatient use)     Chief Complaint  Patient presents with  . Medical Management of Chronic Issues    Routine Visit    HPI:  Pt is a 84 y.o. female seen today for medical management of chronic diseases.    Anxiety disorder Stable on current medication regimen. Sx worsened recently but sx change correlates with pt being placed on Namenda by Neurologist. Medication weaned/ DC'ed. Pt is pleasant and much less irritable. Stable on Venlafaxine 75 mg po Q Day, Depakote 250 mg TID, Xanax 0.5 mg Q AM and 1 mg Q HS.   Pneumonia Resolving. Pt was started on Augmentin 875-125 mg Q 12 hours x 7 days as verbal order d/t CXR results obtained in the evening. Pt was having cough, congestion, malaise. Mildly productive cough. Now, lungs clear. No c/o cough or congestion. Participating in activities, etc. Afebrile.   Essential hypertension Stable.  B/Ps controlled- no recent episodes of hypo or hypertension. Sx controlled with valsartan-HCTZ 160-12.5 mg po Q Day  Please note pt with limited verbal/cognitive ability. Unable to obtain complete ROS. Some ROS info obtained from staff and documentation.   Past Medical History:  Diagnosis Date  . Anxiety    unspecified  . Chronic back pain unk  . Depression unk  . GERD (gastroesophageal reflux disease)   . History of hiatal hernia 08/08/2011  . Hyperlipidemia, unspecified   . Hypertension   . Lymphocytic colitis   . MGUS (monoclonal gammopathy of unknown significance)   . Osteopenia   . Scoliosis    born with   . Scoliosis deformity of spine    Past Surgical History:  Procedure Laterality Date  . ABDOMINAL HYSTERECTOMY  1973  . HERNIA REPAIR    . NECK SURGERY    . NISSEN FUNDOPLICATION      No Known Allergies  Allergies as of 12/13/2017   No Known Allergies     Medication List        Accurate as of 12/13/17 11:59 PM. Always use your most recent med list.          acetaminophen 325 MG tablet Commonly known as:  TYLENOL Take 650 mg by mouth 4 (four) times daily.   ALPRAZolam 1 MG tablet Commonly known as:  XANAX Take 0.5 mg by mouth every morning. 1/2 tab   ALPRAZolam 1 MG tablet Commonly known as:  XANAX Take 1 mg by mouth at bedtime.   amoxicillin-clavulanate 875-125  MG tablet Commonly known as:  AUGMENTIN Take 1 tablet by mouth 2 (two) times daily.   aspirin EC 81 MG tablet Take 81 mg by mouth See admin instructions. Daily on Monday , Wednesday, and Friday for antiplatelet   B-12 1000 MCG/ML Kit Inject 1,000 mcg as directed every 30 (thirty) days. Every 6th of the month.   divalproex 125 MG capsule Commonly known as:  DEPAKOTE SPRINKLE Take 250 mg by mouth 3 (three) times daily with meals.   donepezil 10 MG tablet Commonly known as:  ARICEPT Take 10 mg by mouth at bedtime. Per Gurney Maxin, MD   ENSURE ENLIVE PO Take 1 Bottle by mouth  daily.   guaiFENesin 100 MG/5ML liquid Commonly known as:  ROBITUSSIN Take 200 mg by mouth every 4 (four) hours as needed for cough. (notify MD if symptoms continue for more than 48 hours)   lidocaine 5 % Commonly known as:  LIDODERM Place 1 patch onto the skin daily. Remove & Discard patch within 12 hours or as directed by MD @ 8 pm   ondansetron 4 MG disintegrating tablet Commonly known as:  ZOFRAN-ODT Take 4 mg by mouth every 4 (four) hours as needed for nausea or vomiting.   traMADol 50 MG tablet Commonly known as:  ULTRAM Take 1 tablet (50 mg total) by mouth every 4 (four) hours as needed.   valsartan-hydrochlorothiazide 160-12.5 MG tablet Commonly known as:  DIOVAN-HCT Take 1 tablet by mouth every morning.   venlafaxine XR 75 MG 24 hr capsule Commonly known as:  EFFEXOR-XR Take 75 mg by mouth daily.       Review of Systems  Unable to perform ROS: Dementia  Constitutional: Negative for activity change, appetite change, chills, diaphoresis and fever.  HENT: Negative for congestion, mouth sores, nosebleeds, postnasal drip, sneezing, sore throat, trouble swallowing and voice change.   Respiratory: Negative for apnea, cough, choking, chest tightness, shortness of breath and wheezing.   Cardiovascular: Negative for chest pain, palpitations and leg swelling.  Gastrointestinal: Negative for abdominal distention, abdominal pain, constipation, diarrhea and nausea.  Genitourinary: Negative for difficulty urinating, dysuria, frequency and urgency.  Musculoskeletal: Positive for arthralgias (typical arthritis) and gait problem. Negative for back pain and myalgias.  Skin: Negative for color change, pallor, rash and wound.  Neurological: Positive for weakness. Negative for dizziness, tremors, syncope, speech difficulty, numbness and headaches.  Psychiatric/Behavioral: Negative for agitation and behavioral problems.  All other systems reviewed and are negative.   Immunization  History  Administered Date(s) Administered  . Influenza,inj,Quad PF,6+ Mos 04/04/2013, 04/23/2014  . Influenza-Unspecified 05/01/2012, 04/04/2013, 04/25/2014, 04/26/2015, 04/26/2016, 04/19/2017  . Tdap 07/20/2015   Pertinent  Health Maintenance Due  Topic Date Due  . DEXA SCAN  04/07/1991  . PNA vac Low Risk Adult (1 of 2 - PCV13) 04/07/1991  . INFLUENZA VACCINE  02/23/2018   Fall Risk  08/04/2016 06/02/2016 01/29/2016 12/09/2015 09/11/2015  Falls in the past year? Yes No No No Yes  Number falls in past yr: 1 - - - 1  Comment - - - - -  Injury with Fall? No - - - Yes  Comment - - - - cracked head had to have 13 stitches  Risk Factor Category  - - - - -  Comment - - - - -  Risk for fall due to : - - - - Other (Comment)  Risk for fall due to: Comment - - - - hit head on toilet when she fell  Follow up  Falls evaluation completed - - - Falls evaluation completed  Comment - - - - -   Functional Status Survey:    Vitals:   12/13/17 1405  BP: (!) 167/52  Pulse: 91  Resp: 18  Temp: (!) 97.5 F (36.4 C)  TempSrc: Oral  SpO2: 97%  Weight: 148 lb 12.8 oz (67.5 kg)  Height: '4\' 10"'$  (1.473 m)   Body mass index is 31.1 kg/m. Physical Exam  Constitutional: Vital signs are normal. She appears well-developed and well-nourished. She is active and cooperative. She does not appear ill. No distress.  HENT:  Head: Normocephalic and atraumatic.  Mouth/Throat: Uvula is midline, oropharynx is clear and moist and mucous membranes are normal. Mucous membranes are not pale, not dry and not cyanotic.  Eyes: Pupils are equal, round, and reactive to light. Conjunctivae, EOM and lids are normal.  Neck: Trachea normal, normal range of motion and full passive range of motion without pain. Neck supple. No JVD present. No tracheal deviation, no edema and no erythema present. No thyromegaly present.  Cardiovascular: Normal rate, regular rhythm, normal heart sounds, intact distal pulses and normal pulses. Exam  reveals no gallop, no distant heart sounds and no friction rub.  No murmur heard. Pulses:      Dorsalis pedis pulses are 2+ on the right side, and 2+ on the left side.  No edema  Pulmonary/Chest: Effort normal and breath sounds normal. No accessory muscle usage. No respiratory distress. She has no decreased breath sounds. She has no wheezes. She has no rhonchi. She has no rales. She exhibits no tenderness.  Abdominal: Soft. Normal appearance and bowel sounds are normal. She exhibits no distension and no ascites. There is no tenderness.  Musculoskeletal: Normal range of motion. She exhibits no edema or tenderness.  Expected osteoarthritis, stiffness; Bilateral Calves soft, supple. Negative Homan's Sign. B- pedal pulses equal; ambulatory with walker  Neurological: She is alert. She has normal strength. She exhibits abnormal muscle tone. Coordination abnormal.  Generalized weakness  Skin: Skin is warm, dry and intact. She is not diaphoretic. No cyanosis. No pallor. Nails show no clubbing.  Psychiatric: Her speech is normal and behavior is normal. Judgment and thought content normal. Her affect is blunt. Cognition and memory are impaired. She exhibits abnormal recent memory.  Nursing note and vitals reviewed.   Labs reviewed: Recent Labs    05/10/17 0525 11/14/17 0430  NA 138 138  K 3.9 3.7  CL 101 101  CO2 28 28  GLUCOSE 80 70  BUN 40* 29*  CREATININE 0.81 0.63  CALCIUM 9.1 8.7*  MG 2.1 2.0   Recent Labs    05/10/17 0525 11/14/17 0430  AST 14* 15  ALT 9* 7*  ALKPHOS 34* 31*  BILITOT 0.6 0.6  PROT 6.4* 5.9*  ALBUMIN 3.1* 3.1*   Recent Labs    05/10/17 0525 11/14/17 0430  WBC 6.5 6.4  NEUTROABS 3.1 2.8  HGB 12.8 13.0  HCT 37.9 38.5  MCV 103.1* 104.7*  PLT 246 208   Lab Results  Component Value Date   TSH 1.342 11/14/2017   No results found for: HGBA1C Lab Results  Component Value Date   CHOL 198 10/19/2016   HDL 60 10/19/2016   LDLCALC 123 (H) 10/19/2016    TRIG 74 10/19/2016   CHOLHDL 3.3 10/19/2016    Significant Diagnostic Results in last 30 days:  No results found.  Assessment/Plan Cindy Estes was seen today for medical management of chronic issues.  Diagnoses and all  orders for this visit:  Anxiety disorder, unspecified type  Pneumonia due to infectious organism, unspecified laterality, unspecified part of lung  Essential hypertension   Above listed conditions stable/improving  Continue current medication regimen  Continue to encourage participation in activities and interaction with other residents  Continue to encourage increased po food/fluid intake  Monitor for aspiration/ dysphagia  Fall precautions  Safety precautions  Family/ staff Communication:   Total Time:  Documentation:  Face to Face:  Family/Phone:   Labs/tests ordered:  Not due. Recent labs reviewed- stable  Medication list reviewed and assessed for continued appropriateness. Monthly medication orders reviewed and signed.  Cindy Ports, NP-C Geriatrics [facility] [phone], [place] [address] Cell Phone (Mon-Fri 8am-5pm):  [phone] On Call:  [phone] & follow prompts after 5pm & weekends Office Phone:  [phone] Office Fax:  [phone]

## 2017-12-24 ENCOUNTER — Encounter
Admission: RE | Admit: 2017-12-24 | Discharge: 2017-12-24 | Disposition: A | Discharge status: Home / Self Care | Payer: Medicare Other | Source: Ambulatory Visit | Attending: Internal Medicine | Admitting: Internal Medicine

## 2017-12-29 ENCOUNTER — Encounter: Payer: Self-pay | Admitting: Podiatry

## 2017-12-29 ENCOUNTER — Ambulatory Visit (INDEPENDENT_AMBULATORY_CARE_PROVIDER_SITE_OTHER): Payer: Medicare Other | Admitting: Podiatry

## 2017-12-29 DIAGNOSIS — B351 Tinea unguium: Secondary | ICD-10-CM | POA: Diagnosis not present

## 2017-12-29 DIAGNOSIS — I739 Peripheral vascular disease, unspecified: Secondary | ICD-10-CM

## 2017-12-29 DIAGNOSIS — M79609 Pain in unspecified limb: Secondary | ICD-10-CM

## 2017-12-29 DIAGNOSIS — L853 Xerosis cutis: Secondary | ICD-10-CM

## 2017-12-29 NOTE — Progress Notes (Signed)
Complaint:  Visit Type: Patient returns to my office for continued preventative foot care services. Complaint: Patient states" my nails have grown long and thick and become painful to walk and wear shoes" The patient presents for preventative foot care services. No changes to ROS  Podiatric Exam: Vascular: dorsalis pedis and posterior tibial pulses are palpable bilateral. Capillary return is immediate. Temperature gradient is WNL. Skin turgor WNL  Sensorium: Normal Semmes Weinstein monofilament test. Normal tactile sensation bilaterally. Nail Exam: Pt has thick disfigured discolored nails with subungual debris noted bilateral entire nail hallux through fifth toenails Ulcer Exam: There is no evidence of ulcer or pre-ulcerative changes or infection. Orthopedic Exam: Muscle tone and strength are WNL. No limitations in general ROM. No crepitus or effusions noted. Foot type and digits show no abnormalities. Bony prominences are unremarkable. Skin: No Porokeratosis. No infection or ulcers  Diagnosis:  Onychomycosis, , Pain in right toe, pain in left toes  Treatment & Plan Procedures and Treatment: Consent by patient was obtained for treatment procedures. The patient understood the discussion of treatment and procedures well. All questions were answered thoroughly reviewed. Debridement of mycotic and hypertrophic toenails, 1 through 5 bilateral and clearing of subungual debris. No ulceration, no infection noted. ABN signed for 2019. Return Visit-Office Procedure: Patient instructed to return to the office for a follow up visit 10 weeks  for continued evaluation and treatment.    [name] DPM 

## 2018-01-17 ENCOUNTER — Encounter: Payer: Self-pay | Admitting: Gerontology

## 2018-01-23 ENCOUNTER — Encounter
Admission: RE | Admit: 2018-01-23 | Discharge: 2018-01-23 | Disposition: A | Discharge status: Home / Self Care | Payer: Medicare Other | Source: Ambulatory Visit | Attending: Internal Medicine | Admitting: Internal Medicine

## 2018-01-30 DIAGNOSIS — F39 Unspecified mood [affective] disorder: Secondary | ICD-10-CM | POA: Diagnosis not present

## 2018-01-30 DIAGNOSIS — R413 Other amnesia: Secondary | ICD-10-CM | POA: Diagnosis not present

## 2018-01-30 DIAGNOSIS — R296 Repeated falls: Secondary | ICD-10-CM | POA: Diagnosis not present

## 2018-01-30 NOTE — Progress Notes (Signed)
Opened in error; Disregard.

## 2018-02-10 ENCOUNTER — Non-Acute Institutional Stay: Payer: Medicare Other | Admitting: Adult Health

## 2018-02-10 ENCOUNTER — Other Ambulatory Visit: Payer: Self-pay | Admitting: *Deleted

## 2018-02-10 DIAGNOSIS — G8929 Other chronic pain: Secondary | ICD-10-CM

## 2018-02-10 DIAGNOSIS — I10 Essential (primary) hypertension: Secondary | ICD-10-CM

## 2018-02-10 DIAGNOSIS — F419 Anxiety disorder, unspecified: Secondary | ICD-10-CM | POA: Diagnosis not present

## 2018-02-10 DIAGNOSIS — F330 Major depressive disorder, recurrent, mild: Secondary | ICD-10-CM | POA: Diagnosis not present

## 2018-02-10 DIAGNOSIS — G301 Alzheimer's disease with late onset: Secondary | ICD-10-CM

## 2018-02-10 DIAGNOSIS — F0280 Dementia in other diseases classified elsewhere without behavioral disturbance: Secondary | ICD-10-CM | POA: Diagnosis not present

## 2018-02-10 DIAGNOSIS — M545 Low back pain: Secondary | ICD-10-CM | POA: Diagnosis not present

## 2018-02-10 MED ORDER — VENLAFAXINE HCL ER 75 MG PO CP24: 75.0000 mg | ORAL_CAPSULE | Freq: Every day | ORAL | 12 refills | Status: AC

## 2018-02-10 NOTE — Telephone Encounter (Signed)
[place] Ph: [phone], Fax:[phone]

## 2018-02-10 NOTE — Progress Notes (Signed)
Location:   [facility] Room Number: 250 Place of Service:  ALF (13)   CODE STATUS: DNR  Allergies  Allergen Reactions  . Namenda [Memantine]     Chief Complaint  Patient presents with  . Medical Management of Chronic Issues    Hypertension; anxiety; depression    HPI:  She is a 84 year old long term resident of this facility being seen for the management of her chronic illnesses; hypertension; anxiety depression. She is unable to participate in the hpi or ros. There are no reports of uncontrolled pain; no anxiety or insomnia present. There are no nursing concerns at this time.   Past Medical History:  Diagnosis Date  . Anxiety    unspecified  . Chronic back pain unk  . Depression unk  . GERD (gastroesophageal reflux disease)   . History of hiatal hernia 08/08/2011  . Hyperlipidemia, unspecified   . Hypertension   . Lymphocytic colitis   . MGUS (monoclonal gammopathy of unknown significance)   . Osteopenia   . Scoliosis    born with   . Scoliosis deformity of spine     Past Surgical History:  Procedure Laterality Date  . ABDOMINAL HYSTERECTOMY  1973  . HERNIA REPAIR    . NECK SURGERY    . NISSEN FUNDOPLICATION      Social History   Socioeconomic History  . Marital status: Widowed    Spouse name: Not on file  . Number of children: 3  . Years of education: Not on file  . Highest education level: Not on file  Occupational History  . Not on file  Social Needs  . Financial resource strain: Not on file  . Food insecurity:    Worry: Not on file    Inability: Not on file  . Transportation needs:    Medical: Not on file    Non-medical: Not on file  Tobacco Use  . Smoking status: Never Smoker  . Smokeless tobacco: Never Used  Substance and Sexual Activity  . Alcohol use: No    Alcohol/week: 0.0 oz  . Drug use: No  . Sexual activity: Not on file  Lifestyle  . Physical activity:    Days per week: Not on file    Minutes per  session: Not on file  . Stress: Not on file  Relationships  . Social connections:    Talks on phone: Not on file    Gets together: Not on file    Attends religious service: Not on file    Active member of club or organization: Not on file    Attends meetings of clubs or organizations: Not on file    Relationship status: Not on file  . Intimate partner violence:    Fear of current or ex partner: Not on file    Emotionally abused: Not on file    Physically abused: Not on file    Forced sexual activity: Not on file  Other Topics Concern  . Not on file  Social History Narrative   Admitted to [facility] 10/07/2016   Widowed   3 children   Never smoker   No alcohol    DNR with HCPOA   Family History  Problem Relation Age of Onset  . Dementia Father   . Heart disease Maternal Grandmother   . Diabetes Maternal Grandfather   . Other Mother        Died during childbirth  . Heart disease Maternal Uncle   .  Arthritis Other   . Diabetes type II Other   . Hypertension Other   . Heart attack Other   . Rheum arthritis Other       VITAL SIGNS BP (!) 150/50   Pulse 81   Temp (!) 96.6 F (35.9 C)   Resp 20   Ht '4\' 10"'  (1.473 m)   Wt 146 lb 14.4 oz (66.6 kg)   SpO2 98%   BMI 30.70 kg/m   Outpatient Encounter Medications as of 02/10/2018  Medication Sig  . acetaminophen (TYLENOL) 325 MG tablet Take 650 mg by mouth 4 (four) times daily.  Marland Kitchen ALPRAZolam (XANAX) 1 MG tablet Take 0.5 mg by mouth every morning. 1/2 tab  . ALPRAZolam (XANAX) 1 MG tablet Take 1 mg by mouth at bedtime.  Marland Kitchen aspirin EC 81 MG tablet Take 81 mg by mouth See admin instructions. Daily on Monday , Wednesday, and Friday for antiplatelet  . Cyanocobalamin (B-12) 1000 MCG/ML KIT Inject 1,000 mcg as directed every 30 (thirty) days. Every 6th of the month.  . divalproex (DEPAKOTE SPRINKLE) 125 MG capsule Take 250 mg by mouth 3 (three) times daily with meals.   . donepezil (ARICEPT) 10 MG tablet Take 10 mg  by mouth at bedtime. Per Gurney Maxin, MD  . guaiFENesin (ROBITUSSIN) 100 MG/5ML liquid Take 200 mg by mouth every 4 (four) hours as needed for cough. (notify MD if symptoms continue for more than 48 hours)  . lidocaine (LIDODERM) 5 % Place 1 patch onto the skin daily. Remove & Discard patch within 12 hours or as directed by MD @ 8 pm  . NON FORMULARY Diet: Regular  . Nutritional Supplements (ENSURE ENLIVE PO) Take 1 Bottle by mouth daily.   . ondansetron (ZOFRAN-ODT) 4 MG disintegrating tablet Take 4 mg by mouth every 4 (four) hours as needed for nausea or vomiting.  . traMADol (ULTRAM) 50 MG tablet Take 1 tablet (50 mg total) by mouth every 4 (four) hours as needed.  . valsartan-hydrochlorothiazide (DIOVAN-HCT) 160-12.5 MG tablet Take 1 tablet by mouth every morning. (Patient taking differently: Take 1 tablet by mouth every morning. )  . venlafaxine XR (EFFEXOR-XR) 75 MG 24 hr capsule Take 75 mg by mouth daily.   No facility-administered encounter medications on file as of 02/10/2018.      SIGNIFICANT DIAGNOSTIC EXAMS  LABS REVIEWED: TODAY:   11-14-17: wbc 6.4; hgb 13.0; hct 38.5; mcv 104.7; plt 208; glucose 70; bun 29; creat 0.63; k+ 3.7; na++ 138; ca 8.7; liver normal albumin 3.1 tsh 1.342; vit B 12: 453; vit D 33.3; depakote: 27  Review of Systems  Unable to perform ROS: Dementia (unable to participate )   Physical Exam  Constitutional: She appears well-developed and well-nourished. No distress.  Neck: No thyromegaly present.  Cardiovascular: Normal rate, regular rhythm, normal heart sounds and intact distal pulses.  Pulmonary/Chest: Effort normal and breath sounds normal. No respiratory distress.  Abdominal: Soft. Bowel sounds are normal. She exhibits no distension. There is no tenderness.  Musculoskeletal: She exhibits no edema.  Is able to move all extremities  Scoliosis   Lymphadenopathy:    She has no cervical adenopathy.  Neurological: She is alert.  Skin: Skin is warm  and dry. She is not diaphoretic.  Psychiatric: She has a normal mood and affect.     ASSESSMENT/ PLAN:  TODAY:   1. Essential hypertension: stable b/p 150/50: is stable will continue diovan hct: 160/12.5 mg daily asa three times weekly   2.  Anxiety disorder: is stable will continue xanax 0.5 mg in the AM and 1 mg in the PM; takes effexor xr 75 mg daily   3. Mild episode of recurrent major depressive disorder: is stable will continue effexor xr 75 mg daily takes depakote 250 mg three times daily for help stabilize mood   4. Deficency of other specified B group vitamins: is stable will continue monthly vit B 12 injection  5. Alzheimer's disease late onset: is without change: weight is 146 pounds; will continue aricept 10 mg daily   6.  Chronic bilateral low back pain: is stable will continue tylenol 650 mg four times daily; lidoderm patch to back; has ultram 50 mg every 4 hours as needed for pain.       MD is aware of resident's narcotic use and is in agreement with current plan of care. We will attempt to wean resident as apropriate   Ok Edwards NP [facility]  Contact [phone] Monday through Friday 8am- 5pm  After hours call [phone]

## 2018-02-23 ENCOUNTER — Encounter
Admission: RE | Admit: 2018-02-23 | Discharge: 2018-02-23 | Disposition: A | Discharge status: Home / Self Care | Payer: Medicare Other | Source: Ambulatory Visit | Attending: Internal Medicine | Admitting: Internal Medicine

## 2018-02-23 ENCOUNTER — Other Ambulatory Visit: Payer: Self-pay

## 2018-02-23 MED ORDER — ALPRAZOLAM 1 MG PO TABS: 1.0000 mg | ORAL_TABLET | Freq: Every day | ORAL | 0 refills | Status: DC

## 2018-02-23 NOTE — Telephone Encounter (Signed)
Rx sent to [facility] phone : [phone] , fax : [phone]  

## 2018-03-02 ENCOUNTER — Ambulatory Visit (INDEPENDENT_AMBULATORY_CARE_PROVIDER_SITE_OTHER): Payer: Medicare Other | Admitting: Podiatry

## 2018-03-02 ENCOUNTER — Encounter: Payer: Self-pay | Admitting: Podiatry

## 2018-03-02 DIAGNOSIS — I739 Peripheral vascular disease, unspecified: Secondary | ICD-10-CM

## 2018-03-02 DIAGNOSIS — M79609 Pain in unspecified limb: Secondary | ICD-10-CM

## 2018-03-02 DIAGNOSIS — B351 Tinea unguium: Secondary | ICD-10-CM

## 2018-03-02 NOTE — Progress Notes (Signed)
Complaint:  Visit Type: Patient returns to my office for continued preventative foot care services. Complaint: Patient states" my nails have grown long and thick and become painful to walk and wear shoes" The patient presents for preventative foot care services. No changes to ROS  Podiatric Exam: Vascular: dorsalis pedis and posterior tibial pulses are palpable bilateral. Capillary return is immediate. Temperature gradient is WNL. Skin turgor WNL  Sensorium: Normal Semmes Weinstein monofilament test. Normal tactile sensation bilaterally. Nail Exam: Pt has thick disfigured discolored nails with subungual debris noted bilateral entire nail hallux through fifth toenails Ulcer Exam: There is no evidence of ulcer or pre-ulcerative changes or infection. Orthopedic Exam: Muscle tone and strength are WNL. No limitations in general ROM. No crepitus or effusions noted. Foot type and digits show no abnormalities. Bony prominences are unremarkable. Skin: No Porokeratosis. No infection or ulcers  Diagnosis:  Onychomycosis, , Pain in right toe, pain in left toes  Treatment & Plan Procedures and Treatment: Consent by patient was obtained for treatment procedures. The patient understood the discussion of treatment and procedures well. All questions were answered thoroughly reviewed. Debridement of mycotic and hypertrophic toenails, 1 through 5 bilateral and clearing of subungual debris. No ulceration, no infection noted. ABN signed for 2019. Return Visit-Office Procedure: Patient instructed to return to the office for a follow up visit 10 weeks  for continued evaluation and treatment.    Gardiner Barefoot DPM

## 2018-03-06 DIAGNOSIS — R600 Localized edema: Secondary | ICD-10-CM | POA: Diagnosis not present

## 2018-03-06 DIAGNOSIS — I10 Essential (primary) hypertension: Secondary | ICD-10-CM | POA: Diagnosis not present

## 2018-03-06 DIAGNOSIS — F325 Major depressive disorder, single episode, in full remission: Secondary | ICD-10-CM | POA: Diagnosis not present

## 2018-03-06 DIAGNOSIS — Z593 Problems related to living in residential institution: Secondary | ICD-10-CM | POA: Diagnosis not present

## 2018-03-06 DIAGNOSIS — F0390 Unspecified dementia without behavioral disturbance: Secondary | ICD-10-CM | POA: Diagnosis not present

## 2018-03-07 DIAGNOSIS — R079 Chest pain, unspecified: Secondary | ICD-10-CM | POA: Diagnosis not present

## 2018-03-08 ENCOUNTER — Non-Acute Institutional Stay: Payer: Medicare Other | Admitting: Adult Health

## 2018-03-08 ENCOUNTER — Encounter: Payer: Self-pay | Admitting: Adult Health

## 2018-03-08 DIAGNOSIS — E049 Nontoxic goiter, unspecified: Secondary | ICD-10-CM

## 2018-03-08 NOTE — Progress Notes (Signed)
Location:   [facility] Room Number: [phone] Place of Service:  ALF (13)   CODE STATUS: DNR  Allergies  Allergen Reactions  . Memantine Other (See Comments)    Upset stomach    Chief Complaint  Patient presents with  . Acute Visit    Pain under left breast    HPI:  She has been complaining of left chest pain. She denies any pain today. She does tell me that she is tired at this time. She denies any changes in appetite; no headaches or shortness of breath. She had a chest x-ray done on 03-07-18 which demonstrates a possible thyroid goiter. We did talk about the results and she will need a thyroid ultrasound.   Past Medical History:  Diagnosis Date  . Anxiety    unspecified  . Chronic back pain unk  . Depression unk  . GERD (gastroesophageal reflux disease)   . History of hiatal hernia 08/08/2011  . Hyperlipidemia, unspecified   . Hypertension   . Lymphocytic colitis   . MGUS (monoclonal gammopathy of unknown significance)   . Osteopenia   . Scoliosis    born with   . Scoliosis deformity of spine     Past Surgical History:  Procedure Laterality Date  . ABDOMINAL HYSTERECTOMY  1973  . HERNIA REPAIR    . NECK SURGERY    . NISSEN FUNDOPLICATION      Social History   Socioeconomic History  . Marital status: Widowed    Spouse name: Not on file  . Number of children: 3  . Years of education: Not on file  . Highest education level: Not on file  Occupational History  . Not on file  Social Needs  . Financial resource strain: Not on file  . Food insecurity:    Worry: Not on file    Inability: Not on file  . Transportation needs:    Medical: Not on file    Non-medical: Not on file  Tobacco Use  . Smoking status: Never Smoker  . Smokeless tobacco: Never Used  Substance and Sexual Activity  . Alcohol use: No    Alcohol/week: 0.0 standard drinks  . Drug use: No  . Sexual activity: Not on file  Lifestyle  . Physical activity:    Days  per week: Not on file    Minutes per session: Not on file  . Stress: Not on file  Relationships  . Social connections:    Talks on phone: Not on file    Gets together: Not on file    Attends religious service: Not on file    Active member of club or organization: Not on file    Attends meetings of clubs or organizations: Not on file    Relationship status: Not on file  . Intimate partner violence:    Fear of current or ex partner: Not on file    Emotionally abused: Not on file    Physically abused: Not on file    Forced sexual activity: Not on file  Other Topics Concern  . Not on file  Social History Narrative   Admitted to [facility] 10/07/2016   Widowed   3 children   Never smoker   No alcohol    DNR with HCPOA   Family History  Problem Relation Age of Onset  . Dementia Father   . Heart disease Maternal Grandmother   . Diabetes Maternal Grandfather   . Other Mother  Died during childbirth  . Heart disease Maternal Uncle   . Arthritis Other   . Diabetes type II Other   . Hypertension Other   . Heart attack Other   . Rheum arthritis Other       VITAL SIGNS BP 132/60   Pulse 80   Temp (!) 96 F (35.6 C) (Oral)   Resp 18   Ht '4\' 10"'  (1.473 m)   Wt 149 lb 6.4 oz (67.8 kg)   SpO2 97%   BMI 31.22 kg/m   Outpatient Encounter Medications as of 03/08/2018  Medication Sig  . acetaminophen (TYLENOL) 325 MG tablet Take 650 mg by mouth 4 (four) times daily.  Marland Kitchen ALPRAZolam (XANAX) 1 MG tablet Take 0.5 mg by mouth every morning. 1/2 tab  . ALPRAZolam (XANAX) 1 MG tablet Take 1 tablet (1 mg total) by mouth at bedtime.  Marland Kitchen aspirin EC 81 MG tablet Take 81 mg by mouth See admin instructions. Daily on Monday , Wednesday, and Friday for antiplatelet  . Cyanocobalamin (B-12) 1000 MCG/ML KIT Inject 1,000 mcg as directed every 30 (thirty) days. Every 6th of the month.  . divalproex (DEPAKOTE SPRINKLE) 125 MG capsule Take 250 mg by mouth 3 (three) times daily with  meals.   . donepezil (ARICEPT) 10 MG tablet Take 10 mg by mouth at bedtime. Per Gurney Maxin, MD  . guaiFENesin (ROBITUSSIN) 100 MG/5ML liquid Take 200 mg by mouth every 4 (four) hours as needed for cough. (notify MD if symptoms continue for more than 48 hours)  . lidocaine (LIDODERM) 5 % Place 1 patch onto the skin daily. Remove & Discard patch within 12 hours or as directed by MD @ 8 pm  . NON FORMULARY Diet: Regular  . Nutritional Supplements (ENSURE ENLIVE PO) Take 1 Bottle by mouth daily.   . ondansetron (ZOFRAN-ODT) 4 MG disintegrating tablet Take 4 mg by mouth every 4 (four) hours as needed for nausea or vomiting.  . traMADol (ULTRAM) 50 MG tablet Take 1 tablet (50 mg total) by mouth every 4 (four) hours as needed.  . valsartan-hydrochlorothiazide (DIOVAN-HCT) 160-12.5 MG tablet Take 1 tablet by mouth daily.  Marland Kitchen venlafaxine XR (EFFEXOR-XR) 75 MG 24 hr capsule Take 1 capsule (75 mg total) by mouth daily.  . [DISCONTINUED] valsartan-hydrochlorothiazide (DIOVAN-HCT) 160-12.5 MG tablet Take 1 tablet by mouth every morning. (Patient taking differently: Take 1 tablet by mouth every morning. )   No facility-administered encounter medications on file as of 03/08/2018.      SIGNIFICANT DIAGNOSTIC EXAMS  TODAY:   03-07-18: chest x-ray: mass-like density centered in the superior mediastinum which may represent a goiter or mass.   LABS REVIEWED: TODAY:   11-14-17: wbc 6.4; hgb 13.0; hct 38.5; mcv 104.7; plt 208; glucose 70; bun 29; creat 0.63; k+ 3.7; na++ 138; ca 8.7; liver normal albumin 3.1 tsh 1.342; vit B 12: 453; vit D 33.3; depakote: 27  NO NEW LABS.   Review of Systems  Reason unable to perform ROS: poor historian   Constitutional: Negative for malaise/fatigue.  Respiratory: Negative for cough.   Cardiovascular: Negative for chest pain.  Gastrointestinal: Negative for abdominal pain and heartburn.  Musculoskeletal: Negative for back pain.  Skin: Negative.   Neurological: Negative  for dizziness.  Psychiatric/Behavioral: The patient is not nervous/anxious.     Physical Exam  Constitutional: She appears well-developed and well-nourished. No distress.  Neck: No thyromegaly present.  Cardiovascular: Normal rate, regular rhythm, normal heart sounds and intact distal pulses.  Pulmonary/Chest:  Effort normal and breath sounds normal. No respiratory distress.  Abdominal: Soft. Bowel sounds are normal. She exhibits no distension. There is no tenderness.  Musculoskeletal: She exhibits no edema.  Is able to move all extremities  Scoliosis    Lymphadenopathy:    She has no cervical adenopathy.  Neurological: She is alert.  Skin: Skin is warm and dry. She is not diaphoretic.  Psychiatric: She has a normal mood and affect.    ASSESSMENT/ PLAN:  TODAY:   1. Possible goiter: will setup a thyroid ultrasound; will check tsh free T3 and free T4.   MD is aware of resident's narcotic use and is in agreement with current plan of care. We will attempt to wean resident as apropriate   Ok Edwards NP [facility]  Contact [phone] Monday through Friday 8am- 5pm  After hours call [phone]

## 2018-03-13 ENCOUNTER — Other Ambulatory Visit
Admission: RE | Admit: 2018-03-13 | Discharge: 2018-03-13 | Disposition: A | Discharge status: Home / Self Care | Payer: Medicare Other | Source: Ambulatory Visit | Attending: Internal Medicine | Admitting: Internal Medicine

## 2018-03-13 ENCOUNTER — Other Ambulatory Visit: Payer: Self-pay | Admitting: Internal Medicine

## 2018-03-13 DIAGNOSIS — E049 Nontoxic goiter, unspecified: Secondary | ICD-10-CM

## 2018-03-13 LAB — TSH: TSH: 1.021 u[IU]/mL (ref 0.350–4.500)

## 2018-03-14 ENCOUNTER — Other Ambulatory Visit
Admission: RE | Admit: 2018-03-14 | Discharge: 2018-03-14 | Disposition: A | Discharge status: Home / Self Care | Payer: Medicare Other | Source: Skilled Nursing Facility | Attending: Internal Medicine | Admitting: Internal Medicine

## 2018-03-14 DIAGNOSIS — Z1389 Encounter for screening for other disorder: Secondary | ICD-10-CM | POA: Diagnosis not present

## 2018-03-14 LAB — TSH: TSH: 0.747 u[IU]/mL (ref 0.350–4.500)

## 2018-03-15 LAB — T3, FREE: T3 FREE: 3.1 pg/mL (ref 2.0–4.4)

## 2018-03-15 LAB — T4: T4, Total: 6 ug/dL (ref 4.5–12.0)

## 2018-03-16 ENCOUNTER — Non-Acute Institutional Stay: Payer: Medicare Other | Admitting: Adult Health

## 2018-03-16 ENCOUNTER — Encounter: Payer: Self-pay | Admitting: Adult Health

## 2018-03-16 DIAGNOSIS — E049 Nontoxic goiter, unspecified: Secondary | ICD-10-CM | POA: Diagnosis not present

## 2018-03-16 NOTE — Addendum Note (Signed)
Addended by: Durenda Age C on: 03/16/2018 01:50 PM   Modules accepted: Level of Service

## 2018-03-16 NOTE — Progress Notes (Signed)
[facility] Room Number: 220U Place of Service:  ALF [phone]) Provider:  Durenda Age, NP  Patient Care Team: Kirk Ruths, MD as PCP - General (Internal Medicine)  Extended Emergency Contact Information Primary Emergency Contact: Shinn,[name] [address]          [place], [place] [address] [place] Phone: [phone] Relation: Daughter  Code Status:  DNR  Goals of care: Advanced Directive information Advanced Directives 03/16/2018  Does Patient Have a Medical Advance Directive? Yes  Type of Paramedic of [place];Out of facility DNR (pink MOST or yellow form)  Does patient want to make changes to medical advance directive? No - Patient declined  Copy of [place] in Chart? Yes  Would patient like information on creating a medical advance directive? -  Pre-existing out of facility DNR order (yellow form or pink MOST form) Yellow form placed in chart (order not valid for inpatient use)     Chief Complaint  Patient presents with  . Acute Visit    Abnormal Thyroid Ultrasound    HPI:  Pt is a 84 y.o. female seen today for an abnormal thyroid ultrasound. Thyroid ultrasound done on 03/13/18 showed moderate to large diffuse goiter with heterogenous thyroid tissue and focal area of calcification on the left upper pole.total T4  6.0, free  T3  3 .1, tsh  0.747. She was seen in her room today. She was crying because she wanted to go home. He has PMH of anxiety,depression, GERD, hyperlipidemia, and hypertension.  Past Medical History:  Diagnosis Date  . Anxiety    unspecified  . Chronic back pain unk  . Depression unk  . GERD (gastroesophageal reflux disease)   . History of hiatal hernia 08/08/2011  . Hyperlipidemia, unspecified   . Hypertension   . Lymphocytic colitis   . MGUS (monoclonal gammopathy of unknown significance)   . Osteopenia   . Scoliosis    born with   . Scoliosis deformity of spine    Past Surgical History:  Procedure Laterality Date  . ABDOMINAL HYSTERECTOMY  1973  . HERNIA REPAIR    . NECK SURGERY    . NISSEN FUNDOPLICATION      Allergies  Allergen Reactions  . Memantine Other (See Comments)    Upset stomach    Outpatient Encounter Medications as of 03/16/2018  Medication Sig  . acetaminophen (TYLENOL) 325 MG tablet Take 650 mg by mouth 4 (four) times daily.  Marland Kitchen ALPRAZolam (XANAX) 1 MG tablet Take 0.5 mg by mouth every morning. 1/2 tab  . ALPRAZolam (XANAX) 1 MG tablet Take 1 tablet (1 mg total) by mouth at bedtime.  Marland Kitchen aspirin EC 81 MG tablet Take 81 mg by mouth See admin instructions. Daily on Monday , Wednesday, and Friday for antiplatelet  . Cyanocobalamin (B-12) 1000 MCG/ML KIT Inject 1,000 mcg as directed every 30 (thirty) days. Every 6th of the month.  . divalproex (DEPAKOTE SPRINKLE) 125 MG capsule Take 250 mg by mouth 3 (three) times daily with meals.   . donepezil (ARICEPT) 10 MG tablet Take 10 mg by mouth at bedtime. Per Gurney Maxin, MD  . guaiFENesin (ROBITUSSIN) 100 MG/5ML liquid Take 200 mg by mouth every 4 (four) hours as needed for cough. (notify MD if symptoms continue for more than 48 hours)  . lidocaine (LIDODERM) 5 % Place 1 patch onto the skin daily. Remove & Discard patch within 12 hours or as  directed by MD @ 8 pm  . NON FORMULARY Diet: Regular  . Nutritional Supplements (ENSURE ENLIVE PO) Take 1 Bottle by mouth daily.   . ondansetron (ZOFRAN-ODT) 4 MG disintegrating tablet Take 4 mg by mouth every 4 (four) hours as needed for nausea or vomiting.  . traMADol (ULTRAM) 50 MG tablet Take 1 tablet (50 mg total) by mouth every 4 (four) hours as needed.  . valsartan-hydrochlorothiazide (DIOVAN-HCT) 160-12.5 MG tablet Take 1 tablet by mouth daily.  Marland Kitchen venlafaxine XR (EFFEXOR-XR) 75 MG 24 hr capsule Take 1 capsule (75 mg total) by mouth daily.   No facility-administered encounter medications on file  as of 03/16/2018.     Review of Systems  GENERAL: No change in appetite, no fatigue, no weight changes, no fever, chills or weakness MOUTH and THROAT: Denies oral discomfort, gingival pain or bleeding, pain from teeth or hoarseness   RESPIRATORY: no cough, SOB, DOE, wheezing, hemoptysis CARDIAC: No chest pain, edema or palpitations GI: No abdominal pain, diarrhea, constipation, heart burn, nausea or vomiting GU: Denies dysuria, frequency, hematuria, incontinence, or discharge PSYCHIATRIC: Denies feelings of depression or anxiety. No report of hallucinations, insomnia, paranoia, or agitation   Immunization History  Administered Date(s) Administered  . Influenza,inj,Quad PF,6+ Mos 04/04/2013, 04/23/2014  . Influenza-Unspecified 05/01/2012, 04/04/2013, 04/25/2014, 04/26/2015, 04/26/2016, 04/19/2017  . Tdap 07/20/2015   Pertinent  Health Maintenance Due  Topic Date Due  . DEXA SCAN  04/07/1991  . PNA vac Low Risk Adult (1 of 2 - PCV13) 04/07/1991  . INFLUENZA VACCINE  02/23/2018   Fall Risk  08/04/2016 06/02/2016 01/29/2016 12/09/2015 09/11/2015  Falls in the past year? Yes No No No Yes  Number falls in past yr: 1 - - - 1  Comment - - - - -  Injury with Fall? No - - - Yes  Comment - - - - cracked head had to have 13 stitches  Risk Factor Category  - - - - -  Comment - - - - -  Risk for fall due to : - - - - Other (Comment)  Risk for fall due to: Comment - - - - hit head on toilet when she fell  Follow up Falls evaluation completed - - - Falls evaluation completed  Comment - - - - -   Functional Status Survey:    Vitals:   03/16/18 1119  BP: 132/60  Pulse: 80  Resp: 18  Temp: (!) 96 F (35.6 C)  TempSrc: Oral  SpO2: 97%  Weight: 149 lb 6.4 oz (67.8 kg)  Height: '4\' 10"'  (1.473 m)   Body mass index is 31.22 kg/m.  Physical Exam  GENERAL APPEARANCE: Well nourished. In no acute distress. Obese SKIN:  Skin is warm and dry.  MOUTH and THROAT: Lips are without lesions. Oral  mucosa is moist and without lesions. Tongue is normal in shape, size, and color and without lesions NECK: supple, trachea midline, no neck masses, no thyroid tenderness LYMPHATICS: No LAN in the neck, no supraclavicular LAN RESPIRATORY: Breathing is even & unlabored, BS CTAB CARDIAC: RRR, no murmur,no extra heart sounds, no edema GI: Abdomen soft, normal BS, no masses, no tenderness EXTREMITIES, Able to move 4 extremities NEUROLOGICAL: There is no tremor. Speech is clear PSYCHIATRIC: Alert to self, disoriented to time and place. Affect and behavior are appropriate  Labs reviewed: Recent Labs    05/10/17 0525 11/14/17 0430  NA 138 138  K 3.9 3.7  CL 101 101  CO2 28 28  GLUCOSE 80 70  BUN 40* 29*  CREATININE 0.81 0.63  CALCIUM 9.1 8.7*  MG 2.1 2.0   Recent Labs    05/10/17 0525 11/14/17 0430  AST 14* 15  ALT 9* 7*  ALKPHOS 34* 31*  BILITOT 0.6 0.6  PROT 6.4* 5.9*  ALBUMIN 3.1* 3.1*   Recent Labs    05/10/17 0525 11/14/17 0430  WBC 6.5 6.4  NEUTROABS 3.1 2.8  HGB 12.8 13.0  HCT 37.9 38.5  MCV 103.1* 104.7*  PLT 246 208   Lab Results  Component Value Date   TSH 0.747 03/14/2018   No results found for: HGBA1C Lab Results  Component Value Date   CHOL 198 10/19/2016   HDL 60 10/19/2016   LDLCALC 123 (H) 10/19/2016   TRIG 74 10/19/2016   CHOLHDL 3.3 10/19/2016     Assessment/Plan  1. Goiter - Thyroid ultrasound done on 03/13/18 showed moderate to large diffuse goiter with heterogenous thyroid tissue and focal area of calcification on the left upper pole.total T4  6.0, free  T3  3 .1, tsh  0.747, will refer to endocrinologist for evaluation and management   Family/ staff Communication: Discussed plan of care with resident.  Labs/tests ordered:  None  Goals of care:   Long-term care  Durenda Age, NP [facility] [phone] (Monday-Friday 8:00 a.m. - 5:00 p.m.) [phone] (after hours)

## 2018-03-20 DIAGNOSIS — E049 Nontoxic goiter, unspecified: Secondary | ICD-10-CM | POA: Insufficient documentation

## 2018-03-26 ENCOUNTER — Encounter
Admission: RE | Admit: 2018-03-26 | Discharge: 2018-03-26 | Disposition: A | Discharge status: Home / Self Care | Payer: Medicare Other | Source: Ambulatory Visit | Attending: Internal Medicine | Admitting: Internal Medicine

## 2018-03-28 ENCOUNTER — Other Ambulatory Visit: Payer: Self-pay

## 2018-03-28 MED ORDER — ALPRAZOLAM 1 MG PO TABS: 1.0000 mg | ORAL_TABLET | Freq: Every day | ORAL | 0 refills | Status: DC

## 2018-03-28 NOTE — Telephone Encounter (Signed)
Rx sent to [facility] phone : [phone] , fax : [phone]  

## 2018-04-06 DIAGNOSIS — E049 Nontoxic goiter, unspecified: Secondary | ICD-10-CM | POA: Diagnosis not present

## 2018-04-13 ENCOUNTER — Encounter: Payer: Self-pay | Admitting: Adult Health

## 2018-04-13 ENCOUNTER — Non-Acute Institutional Stay: Payer: Medicare Other | Admitting: Adult Health

## 2018-04-13 DIAGNOSIS — F419 Anxiety disorder, unspecified: Secondary | ICD-10-CM

## 2018-04-13 DIAGNOSIS — F330 Major depressive disorder, recurrent, mild: Secondary | ICD-10-CM | POA: Diagnosis not present

## 2018-04-13 DIAGNOSIS — I10 Essential (primary) hypertension: Secondary | ICD-10-CM | POA: Diagnosis not present

## 2018-04-13 NOTE — Progress Notes (Signed)
Location:   [facility] [place] Room Number: [phone] Place of Service:  ALF (13)   CODE STATUS: DNR  Allergies  Allergen Reactions  . Memantine Other (See Comments)    Upset stomach    Chief Complaint  Patient presents with  . Medical Management of Chronic Issues    Hypertension; depression; anxiety.     HPI:  She is a 84 year old long term resident of assisted living being seen for the management of her chronic illnesses: hypertension; depression; anxiety. She is unable to participate in the hpi or ros. There are no reports of changes in appetite; no reports of uncontrolled anxiety or indications of worsening depression.   Past Medical History:  Diagnosis Date  . Anxiety    unspecified  . Chronic back pain unk  . Depression unk  . GERD (gastroesophageal reflux disease)   . History of hiatal hernia 08/08/2011  . Hyperlipidemia, unspecified   . Hypertension   . Lymphocytic colitis   . MGUS (monoclonal gammopathy of unknown significance)   . Osteopenia   . Scoliosis    born with   . Scoliosis deformity of spine     Past Surgical History:  Procedure Laterality Date  . ABDOMINAL HYSTERECTOMY  1973  . HERNIA REPAIR    . NECK SURGERY    . NISSEN FUNDOPLICATION      Social History   Socioeconomic History  . Marital status: Widowed    Spouse name: Not on file  . Number of children: 3  . Years of education: Not on file  . Highest education level: Not on file  Occupational History  . Not on file  Social Needs  . Financial resource strain: Not on file  . Food insecurity:    Worry: Not on file    Inability: Not on file  . Transportation needs:    Medical: Not on file    Non-medical: Not on file  Tobacco Use  . Smoking status: Never Smoker  . Smokeless tobacco: Never Used  Substance and Sexual Activity  . Alcohol use: No    Alcohol/week: 0.0 standard drinks  . Drug use: No  . Sexual activity: Not on file  Lifestyle  . Physical activity:      Days per week: Not on file    Minutes per session: Not on file  . Stress: Not on file  Relationships  . Social connections:    Talks on phone: Not on file    Gets together: Not on file    Attends religious service: Not on file    Active member of club or organization: Not on file    Attends meetings of clubs or organizations: Not on file    Relationship status: Not on file  . Intimate partner violence:    Fear of current or ex partner: Not on file    Emotionally abused: Not on file    Physically abused: Not on file    Forced sexual activity: Not on file  Other Topics Concern  . Not on file  Social History Narrative   Admitted to [facility] 10/07/2016   Widowed   3 children   Never smoker   No alcohol    DNR with HCPOA   Family History  Problem Relation Age of Onset  . Dementia Father   . Heart disease Maternal Grandmother   . Diabetes Maternal Grandfather   . Other Mother        Died during childbirth  .  Heart disease Maternal Uncle   . Arthritis Other   . Diabetes type II Other   . Hypertension Other   . Heart attack Other   . Rheum arthritis Other       VITAL SIGNS BP (!) 144/96   Pulse 86   Temp (!) 96.7 F (35.9 C) (Oral)   Resp 18   Ht _0  (1.473 m)   Wt 147 lb 9.6 oz (67 kg)   SpO2 95%   BMI 30.85 kg/m   Outpatient Encounter Medications as of 04/13/2018  Medication Sig  . acetaminophen (TYLENOL) 325 MG tablet Take 650 mg by mouth 4 (four) times daily.  Marland Kitchen ALPRAZolam (XANAX) 1 MG tablet Take 0.5 mg by mouth every morning. 1/2 tab  . ALPRAZolam (XANAX) 1 MG tablet Take 1 tablet (1 mg total) by mouth at bedtime.  Marland Kitchen aspirin EC 81 MG tablet Take 81 mg by mouth See admin instructions. Daily on Monday , Wednesday, and Friday for antiplatelet  . Cyanocobalamin (B-12) 1000 MCG/ML KIT Inject 1,000 mcg as directed every 30 (thirty) days. Every 6th of the month.  . divalproex (DEPAKOTE SPRINKLE) 125 MG capsule Take 250 mg by mouth 3 (three) times  daily with meals.   . donepezil (ARICEPT) 10 MG tablet Take 10 mg by mouth at bedtime. Per Gurney Maxin, MD  . guaiFENesin (ROBITUSSIN) 100 MG/5ML liquid Take 200 mg by mouth every 4 (four) hours as needed for cough. (notify MD if symptoms continue for more than 48 hours)  . lidocaine (LIDODERM) 5 % Place 1 patch onto the skin daily. Remove & Discard patch within 12 hours or as directed by MD @ 8 pm  . NON FORMULARY Diet: Regular  . Nutritional Supplements (ENSURE ENLIVE PO) Take 1 Bottle by mouth daily.   . ondansetron (ZOFRAN-ODT) 4 MG disintegrating tablet Take 4 mg by mouth every 4 (four) hours as needed for nausea or vomiting.  . traMADol (ULTRAM) 50 MG tablet Take 1 tablet (50 mg total) by mouth every 4 (four) hours as needed.  . valsartan-hydrochlorothiazide (DIOVAN-HCT) 160-12.5 MG tablet Take 1 tablet by mouth daily.  Marland Kitchen venlafaxine XR (EFFEXOR-XR) 75 MG 24 hr capsule Take 1 capsule (75 mg total) by mouth daily.   No facility-administered encounter medications on file as of 04/13/2018.      SIGNIFICANT DIAGNOSTIC EXAMS   PREVIOUS:   03-07-18: chest x-ray: mass-like density centered in the superior mediastinum which may represent a goiter or mass.   LABS REVIEWED: PREVIOUS  11-14-17: wbc 6.4; hgb 13.0; hct 38.5; mcv 104.7; plt 208; glucose 70; bun 29; creat 0.63; k+ 3.7; na++ 138; ca 8.7; liver normal albumin 3.1 tsh 1.342; vit B 12: 453; vit D 33.3; depakote: 27  TODAY  03-13-18 ;tsh 1.021 03-14-18:  tsh 0.747; free T3: 3.1    Review of Systems  Reason unable to perform ROS: poor hisotrian   Constitutional: Negative for malaise/fatigue.  Respiratory: Negative for cough and shortness of breath.   Cardiovascular: Negative for chest pain.  Gastrointestinal: Negative for constipation and heartburn.  Musculoskeletal: Negative for back pain.  Skin: Negative.   Neurological: Negative for dizziness.  Psychiatric/Behavioral: The patient is not nervous/anxious.     Physical  Exam  Constitutional: She appears well-developed and well-nourished. No distress.  Neck: No thyromegaly present.  Cardiovascular: Normal rate, regular rhythm, normal heart sounds and intact distal pulses.  Pulmonary/Chest: Effort normal and breath sounds normal. No respiratory distress.  Abdominal: Soft. Bowel sounds are normal. She  exhibits no distension. There is no tenderness.  Musculoskeletal: She exhibits no edema.  Is able to move all extremities scoliolosis  Lymphadenopathy:    She has no cervical adenopathy.  Neurological: She is alert.  Skin: Skin is warm and dry. She is not diaphoretic.  Psychiatric: She has a normal mood and affect.    ASSESSMENT/ PLAN:  TODAY:   1. Essential hypertension: stable b/p 144/96: is stable will continue diovan hct: 160/12.5 mg daily asa three times weekly   2. Anxiety disorder: is stable will continue xanax 0.5 mg in the AM and 1 mg in the PM; takes effexor xr 75 mg daily   3. Mild episode of recurrent major depressive disorder: is stable will continue effexor xr 75 mg daily takes depakote 250 mg three times daily for help stabilize mood   PREVIOUS   4. Deficency of other specified B group vitamins: is stable will continue monthly vit B 12 injection  5. Alzheimer's disease late onset: is without change: weight is 146 pounds; will continue aricept 10 mg daily   6.  Chronic bilateral low back pain: is stable will continue tylenol 650 mg four times daily; lidoderm patch to back; has ultram 50 mg every 4 hours as needed for pain.   7. Goiter: is without change: has normal tsh and free T3; was seen by endocrinology and will do watchful monitoring.    MD is aware of resident's narcotic use and is in agreement with current plan of care. We will attempt to wean resident as apropriate   Ok Edwards NP [facility]  Contact [phone] Monday through Friday 8am- 5pm  After hours call [phone]

## 2018-04-24 ENCOUNTER — Other Ambulatory Visit: Payer: Self-pay

## 2018-04-24 MED ORDER — ALPRAZOLAM 1 MG PO TABS: 1.0000 mg | ORAL_TABLET | Freq: Every day | ORAL | 0 refills | Status: DC

## 2018-04-24 NOTE — Telephone Encounter (Signed)
Rx sent to [facility] phone : [phone] , fax : [phone]  

## 2018-04-25 ENCOUNTER — Encounter
Admission: RE | Admit: 2018-04-25 | Discharge: 2018-04-25 | Disposition: A | Discharge status: Home / Self Care | Payer: Medicare Other | Source: Ambulatory Visit | Attending: Internal Medicine | Admitting: Internal Medicine

## 2018-05-25 ENCOUNTER — Encounter: Payer: Self-pay | Admitting: Podiatry

## 2018-05-25 ENCOUNTER — Ambulatory Visit (INDEPENDENT_AMBULATORY_CARE_PROVIDER_SITE_OTHER): Payer: Medicare Other | Admitting: Podiatry

## 2018-05-25 DIAGNOSIS — M79609 Pain in unspecified limb: Secondary | ICD-10-CM

## 2018-05-25 DIAGNOSIS — I739 Peripheral vascular disease, unspecified: Secondary | ICD-10-CM

## 2018-05-25 DIAGNOSIS — B351 Tinea unguium: Secondary | ICD-10-CM | POA: Diagnosis not present

## 2018-05-25 NOTE — Progress Notes (Signed)
Complaint:  Visit Type: Patient returns to my office for continued preventative foot care services. Complaint: Patient states" my nails have grown long and thick and become painful to walk and wear shoes" The patient presents for preventative foot care services. No changes to ROS  Podiatric Exam: Vascular: dorsalis pedis and posterior tibial pulses are palpable bilateral. Capillary return is immediate. Temperature gradient is WNL. Skin turgor WNL  Sensorium: Normal Semmes Weinstein monofilament test. Normal tactile sensation bilaterally. Nail Exam: Pt has thick disfigured discolored nails with subungual debris noted bilateral entire nail hallux through fifth toenails Ulcer Exam: There is no evidence of ulcer or pre-ulcerative changes or infection. Orthopedic Exam: Muscle tone and strength are WNL. No limitations in general ROM. No crepitus or effusions noted. Foot type and digits show no abnormalities. Bony prominences are unremarkable. Skin: No Porokeratosis. No infection or ulcers  Diagnosis:  Onychomycosis, , Pain in right toe, pain in left toes  Treatment & Plan Procedures and Treatment: Consent by patient was obtained for treatment procedures. The patient understood the discussion of treatment and procedures well. All questions were answered thoroughly reviewed. Debridement of mycotic and hypertrophic toenails, 1 through 5 bilateral and clearing of subungual debris. No ulceration, no infection noted. ABN signed for 2019. Return Visit-Office Procedure: Patient instructed to return to the office for a follow up visit 10 weeks  for continued evaluation and treatment.    Gardiner Barefoot DPM

## 2018-05-26 ENCOUNTER — Encounter
Admission: RE | Admit: 2018-05-26 | Discharge: 2018-05-26 | Disposition: A | Discharge status: Home / Self Care | Payer: Medicare Other | Source: Ambulatory Visit | Attending: Internal Medicine | Admitting: Internal Medicine

## 2018-06-14 ENCOUNTER — Non-Acute Institutional Stay: Payer: Medicare Other | Admitting: Adult Health

## 2018-06-14 ENCOUNTER — Encounter: Payer: Self-pay | Admitting: Adult Health

## 2018-06-14 DIAGNOSIS — I10 Essential (primary) hypertension: Secondary | ICD-10-CM

## 2018-06-14 DIAGNOSIS — G301 Alzheimer's disease with late onset: Secondary | ICD-10-CM | POA: Diagnosis not present

## 2018-06-14 DIAGNOSIS — F0280 Dementia in other diseases classified elsewhere without behavioral disturbance: Secondary | ICD-10-CM

## 2018-06-14 DIAGNOSIS — M545 Low back pain: Secondary | ICD-10-CM

## 2018-06-14 DIAGNOSIS — E049 Nontoxic goiter, unspecified: Secondary | ICD-10-CM | POA: Diagnosis not present

## 2018-06-14 DIAGNOSIS — G8929 Other chronic pain: Secondary | ICD-10-CM | POA: Diagnosis not present

## 2018-06-14 DIAGNOSIS — F330 Major depressive disorder, recurrent, mild: Secondary | ICD-10-CM | POA: Diagnosis not present

## 2018-06-14 NOTE — Progress Notes (Signed)
Provider:  Ok Edwards, NP Location:  [facility] Room Number: [place] of Service:  ALF [phone])   PCP: Kirk Ruths, MD Patient Care Team: Kirk Ruths, MD as PCP - General (Internal Medicine) Nyoka Cowden Phylis Bougie, NP as Nurse Practitioner (Geriatric Medicine)  Extended Emergency Contact Information Primary Emergency Contact: Moffet,[name] Address: [address]          [place], [place] [address] [place] Phone: [phone] Relation: Daughter  Code Status: DNR Goals of Care: Advanced Directive information Advanced Directives 06/14/2018  Does Patient Have a Medical Advance Directive? Yes  Type of Advance Directive Out of facility DNR (pink MOST or yellow form)  Does patient want to make changes to medical advance directive? No - Patient declined  Copy of [place] in Chart? -  Would patient like information on creating a medical advance directive? -  Pre-existing out of facility DNR order (yellow form or pink MOST form) Yellow form placed in chart (order not valid for inpatient use)      Allergies  Allergen Reactions  . Memantine Other (See Comments)    Upset stomach     Chief Complaint  Patient presents with  . Annual Exam        HPI: Patient is a 84 y.o. female seen today for an annual comprehensive examination. She has not required any hospitalizations over the past year. She denies any constipation no back pain; no changes in appetite; no anxiety. She continues to be followed for her chronic illnesses including: hypertension; goiter; alzheimer's disease.   Past Medical History:  Diagnosis Date  . Anxiety    unspecified  . Chronic back pain unk  . Depression unk  . GERD (gastroesophageal reflux disease)   . History of hiatal hernia 08/08/2011  . Hyperlipidemia, unspecified   . Hypertension   . Lymphocytic colitis   . MGUS (monoclonal gammopathy of unknown significance)   .  Osteopenia   . Scoliosis    born with   . Scoliosis deformity of spine    Past Surgical History:  Procedure Laterality Date  . ABDOMINAL HYSTERECTOMY  1973  . HERNIA REPAIR    . NECK SURGERY    . NISSEN FUNDOPLICATION      reports that she has never smoked. She has never used smokeless tobacco. She reports that she does not drink alcohol or use drugs. Social History   Socioeconomic History  . Marital status: Widowed    Spouse name: Not on file  . Number of children: 3  . Years of education: Not on file  . Highest education level: Not on file  Occupational History  . Not on file  Social Needs  . Financial resource strain: Not on file  . Food insecurity:    Worry: Not on file    Inability: Not on file  . Transportation needs:    Medical: Not on file    Non-medical: Not on file  Tobacco Use  . Smoking status: Never Smoker  . Smokeless tobacco: Never Used  Substance and Sexual Activity  . Alcohol use: No    Alcohol/week: 0.0 standard drinks  . Drug use: No  . Sexual activity: Not on file  Lifestyle  . Physical activity:    Days per week: Not on file    Minutes per session: Not on file  . Stress: Not on file  Relationships  . Social connections:    Talks on phone: Not  on file    Gets together: Not on file    Attends religious service: Not on file    Active member of club or organization: Not on file    Attends meetings of clubs or organizations: Not on file    Relationship status: Not on file  . Intimate partner violence:    Fear of current or ex partner: Not on file    Emotionally abused: Not on file    Physically abused: Not on file    Forced sexual activity: Not on file  Other Topics Concern  . Not on file  Social History Narrative   Admitted to [facility] 10/07/2016   Widowed   3 children   Never smoker   No alcohol    DNR with HCPOA   Family History  Problem Relation Age of Onset  . Dementia Father   . Heart disease Maternal Grandmother     . Diabetes Maternal Grandfather   . Other Mother        Died during childbirth  . Heart disease Maternal Uncle   . Arthritis Other   . Diabetes type II Other   . Hypertension Other   . Heart attack Other   . Rheum arthritis Other     Vitals:   06/14/18 1303  BP: (!) 147/62  Pulse: 82  Resp: 20  Temp: 97.9 F (36.6 C)  SpO2: 93%  Weight: 146 lb 1.6 oz (66.3 kg)  Height: '4\' 10"'  (1.473 m)   Body mass index is 30.53 kg/m.  Allergies as of 06/14/2018      Reactions   Memantine Other (See Comments)   Upset stomach      Medication List        Accurate as of 06/14/18  1:47 PM. Always use your most recent med list.          acetaminophen 325 MG tablet Commonly known as:  TYLENOL Take 650 mg by mouth 4 (four) times daily.   ALPRAZolam 1 MG tablet Commonly known as:  XANAX Take 0.5 mg by mouth every morning. 1/2 tab   ALPRAZolam 1 MG tablet Commonly known as:  XANAX Take 1 tablet (1 mg total) by mouth at bedtime.   aspirin EC 81 MG tablet Take 81 mg by mouth See admin instructions. Daily on Monday , Wednesday, and Friday for antiplatelet   B-12 1000 MCG/ML Kit Inject 1,000 mcg as directed every 30 (thirty) days. Every 6th of the month.   divalproex 125 MG capsule Commonly known as:  DEPAKOTE SPRINKLE Take 250 mg by mouth 3 (three) times daily with meals.   donepezil 10 MG tablet Commonly known as:  ARICEPT Take 10 mg by mouth at bedtime. Per Gurney Maxin, MD   ENSURE ENLIVE PO Take 1 Bottle by mouth daily.   guaiFENesin 100 MG/5ML liquid Commonly known as:  ROBITUSSIN Take 200 mg by mouth every 4 (four) hours as needed for cough. (notify MD if symptoms continue for more than 48 hours)   lidocaine 5 % Commonly known as:  LIDODERM Place 1 patch onto the skin daily. Remove & Discard patch within 12 hours or as directed by MD @ 8 pm   NON FORMULARY Diet: Regular   ondansetron 4 MG disintegrating tablet Commonly known as:  ZOFRAN-ODT Take 4 mg by  mouth every 4 (four) hours as needed for nausea or vomiting.   traMADol 50 MG tablet Commonly known as:  ULTRAM Take 1 tablet (50 mg total) by mouth  every 4 (four) hours as needed.   valsartan-hydrochlorothiazide 160-12.5 MG tablet Commonly known as:  DIOVAN-HCT Take 1 tablet by mouth daily.   venlafaxine XR 75 MG 24 hr capsule Commonly known as:  EFFEXOR-XR Take 1 capsule (75 mg total) by mouth daily.        SIGNIFICANT DIAGNOSTIC EXAMS  PREVIOUS:   03-07-18: chest x-ray: mass-like density centered in the superior mediastinum which may represent a goiter or mass.   TODAY:   03-13-18: thyroid ultrasound:  1. Moderate to large diffuse goiter with very heterogeneous thyroid tissue 2. Focal area of calcification left upper lobe   LABS REVIEWED: PREVIOUS  11-14-17: wbc 6.4; hgb 13.0; hct 38.5; mcv 104.7; plt 208; glucose 70; bun 29; creat 0.63; k+ 3.7; na++ 138; ca 8.7; liver normal albumin 3.1 tsh 1.342; vit B 12: 453; vit D 33.3; depakote: 27 03-13-18 ;tsh 1.021 03-14-18:  tsh 0.747; free T3: 3.1  NO NEW LABS.    Review of Systems  Reason unable to perform ROS: poor historian   Constitutional: Negative for malaise/fatigue.  Respiratory: Negative for cough.   Cardiovascular: Negative for chest pain.  Gastrointestinal: Negative for constipation and heartburn.  Musculoskeletal: Negative for back pain.  Skin: Negative.   Neurological: Negative for dizziness.  Psychiatric/Behavioral: The patient is not nervous/anxious.      Physical Exam  Constitutional: She appears well-developed and well-nourished. No distress.  Neck: Thyromegaly present.  Diffuse goiter   Cardiovascular: Normal rate, regular rhythm, normal heart sounds and intact distal pulses.  Pulmonary/Chest: Effort normal and breath sounds normal. No respiratory distress.  Abdominal: Soft. Bowel sounds are normal. She exhibits no distension. There is no tenderness.  Musculoskeletal: She exhibits edema.  Is able to  move all extremities scoliolosis  Trace bilateral lower extremity edema   Lymphadenopathy:    She has no cervical adenopathy.  Neurological: She is alert.  Skin: Skin is warm and dry. She is not diaphoretic.  Psychiatric: She has a normal mood and affect.     ASSESSMENT/ PLAN:  TODAY:   1. Essential hypertension: stable b/p 147/62: is stable will continue diovan hct: 160/12.5 mg daily asa three times weekly   2. Anxiety disorder: is stable will continue xanax 0.5 mg in the AM and 1 mg in the PM; takes effexor xr 75 mg daily   3. Mild episode of recurrent major depressive disorder: is stable will continue effexor xr 75 mg daily takes depakote 250 mg three times daily for help stabilize mood   4. Deficency of other specified B group vitamins: is stable will continue monthly vit B 12 injection  5. Alzheimer's disease late onset: is without change: weight is 146 pounds; will continue aricept 10 mg daily   6.  Chronic bilateral low back pain: is stable will continue tylenol 650 mg four times daily; lidoderm patch to back; has ultram 50 mg every 4 hours as needed for pain.   7. Goiter: is without change: has normal tsh and free T3; was seen by endocrinology and will do watchful monitoring.   Will check cbc; cmp; vit B 12 depakote level   MD is aware of resident's narcotic use and is in agreement with current plan of care. We will wean dosage as appropriate for resident   Ok Edwards NP [facility]  Contact [phone] Monday through Friday 8am- 5pm  After hours call [phone]

## 2018-06-26 ENCOUNTER — Other Ambulatory Visit: Payer: Self-pay | Admitting: Adult Health

## 2018-06-26 MED ORDER — ALPRAZOLAM 1 MG PO TABS: 1.0000 mg | ORAL_TABLET | Freq: Every day | ORAL | 0 refills | Status: DC

## 2018-06-26 MED ORDER — ALPRAZOLAM 1 MG PO TABS: 0.5000 mg | ORAL_TABLET | ORAL | 0 refills | Status: DC

## 2018-07-25 ENCOUNTER — Other Ambulatory Visit: Payer: Self-pay | Admitting: Adult Health

## 2018-07-25 MED ORDER — ALPRAZOLAM 1 MG PO TABS: 0.5000 mg | ORAL_TABLET | ORAL | 0 refills | Status: DC

## 2018-07-25 MED ORDER — ALPRAZOLAM 1 MG PO TABS: 1.0000 mg | ORAL_TABLET | Freq: Every day | ORAL | 0 refills | Status: DC

## 2018-07-31 DIAGNOSIS — M25561 Pain in right knee: Secondary | ICD-10-CM | POA: Diagnosis not present

## 2018-08-04 DIAGNOSIS — Z593 Problems related to living in residential institution: Secondary | ICD-10-CM | POA: Diagnosis not present

## 2018-08-04 DIAGNOSIS — I10 Essential (primary) hypertension: Secondary | ICD-10-CM | POA: Diagnosis not present

## 2018-08-04 DIAGNOSIS — E049 Nontoxic goiter, unspecified: Secondary | ICD-10-CM | POA: Diagnosis not present

## 2018-08-04 DIAGNOSIS — F325 Major depressive disorder, single episode, in full remission: Secondary | ICD-10-CM | POA: Diagnosis not present

## 2018-08-04 DIAGNOSIS — F0390 Unspecified dementia without behavioral disturbance: Secondary | ICD-10-CM | POA: Diagnosis not present

## 2018-08-04 DIAGNOSIS — Z0000 Encounter for general adult medical examination without abnormal findings: Secondary | ICD-10-CM | POA: Diagnosis not present

## 2018-08-07 ENCOUNTER — Encounter
Admission: RE | Admit: 2018-08-07 | Discharge: 2018-08-07 | Disposition: A | Discharge status: Home / Self Care | Payer: Medicare Other | Source: Ambulatory Visit | Attending: Internal Medicine | Admitting: Internal Medicine

## 2018-08-09 ENCOUNTER — Other Ambulatory Visit: Payer: Self-pay | Admitting: Adult Health

## 2018-08-09 ENCOUNTER — Non-Acute Institutional Stay: Payer: Medicare Other | Admitting: Adult Health

## 2018-08-09 ENCOUNTER — Encounter: Payer: Self-pay | Admitting: Adult Health

## 2018-08-09 DIAGNOSIS — M25561 Pain in right knee: Secondary | ICD-10-CM

## 2018-08-09 MED ORDER — TRAMADOL HCL 50 MG PO TABS: 50.0000 mg | ORAL_TABLET | ORAL | 0 refills | Status: DC | PRN

## 2018-08-09 NOTE — Progress Notes (Signed)
Location:   [facility] Room Number: [place] of Service:  ALF (13)   CODE STATUS: DNR  Allergies  Allergen Reactions  . Memantine Other (See Comments)    Upset stomach    Chief Complaint  Patient presents with  . Acute Visit    Right Knee pain    HPI:  She continues to have right knee pain and swelling with bruising present. Her family is concerned about her continued pain and swelling. They are requesting an MRI for her knee.   Past Medical History:  Diagnosis Date  . Anxiety    unspecified  . Chronic back pain unk  . Depression unk  . GERD (gastroesophageal reflux disease)   . History of hiatal hernia 08/08/2011  . Hyperlipidemia, unspecified   . Hypertension   . Lymphocytic colitis   . MGUS (monoclonal gammopathy of unknown significance)   . Osteopenia   . Scoliosis    born with   . Scoliosis deformity of spine     Past Surgical History:  Procedure Laterality Date  . ABDOMINAL HYSTERECTOMY  1973  . HERNIA REPAIR    . NECK SURGERY    . NISSEN FUNDOPLICATION      Social History   Socioeconomic History  . Marital status: Widowed    Spouse name: Not on file  . Number of children: 3  . Years of education: Not on file  . Highest education level: Not on file  Occupational History  . Not on file  Social Needs  . Financial resource strain: Not on file  . Food insecurity:    Worry: Not on file    Inability: Not on file  . Transportation needs:    Medical: Not on file    Non-medical: Not on file  Tobacco Use  . Smoking status: Never Smoker  . Smokeless tobacco: Never Used  Substance and Sexual Activity  . Alcohol use: No    Alcohol/week: 0.0 standard drinks  . Drug use: No  . Sexual activity: Not on file  Lifestyle  . Physical activity:    Days per week: Not on file    Minutes per session: Not on file  . Stress: Not on file  Relationships  . Social connections:    Talks on phone: Not on file    Gets together:  Not on file    Attends religious service: Not on file    Active member of club or organization: Not on file    Attends meetings of clubs or organizations: Not on file    Relationship status: Not on file  . Intimate partner violence:    Fear of current or ex partner: Not on file    Emotionally abused: Not on file    Physically abused: Not on file    Forced sexual activity: Not on file  Other Topics Concern  . Not on file  Social History Narrative   Admitted to [facility] 10/07/2016   Widowed   3 children   Never smoker   No alcohol    DNR with HCPOA   Family History  Problem Relation Age of Onset  . Dementia Father   . Heart disease Maternal Grandmother   . Diabetes Maternal Grandfather   . Other Mother        Died during childbirth  . Heart disease Maternal Uncle   . Arthritis Other   . Diabetes type II Other   . Hypertension Other   .  Heart attack Other   . Rheum arthritis Other       VITAL SIGNS Ht '4\' 10"'  (1.473 m)   Wt 143 lb 9.6 oz (65.1 kg)   BMI 30.01 kg/m   Outpatient Encounter Medications as of 08/09/2018  Medication Sig  . acetaminophen (TYLENOL) 325 MG tablet Take 650 mg by mouth 4 (four) times daily.  Marland Kitchen ALPRAZolam (XANAX) 1 MG tablet Take 0.5 tablets (0.5 mg total) by mouth every morning. 1/2 tab  . ALPRAZolam (XANAX) 1 MG tablet Take 1 tablet (1 mg total) by mouth at bedtime.  Marland Kitchen aspirin EC 81 MG tablet Take 81 mg by mouth See admin instructions. Daily on Monday , Wednesday, and Friday for antiplatelet  . Cyanocobalamin (B-12) 1000 MCG/ML KIT Inject 1,000 mcg as directed every 30 (thirty) days. Every 6th of the month.  . divalproex (DEPAKOTE SPRINKLE) 125 MG capsule Take 250 mg by mouth 3 (three) times daily with meals.   . donepezil (ARICEPT) 10 MG tablet Take 10 mg by mouth at bedtime. Per Gurney Maxin, MD  . guaiFENesin (ROBITUSSIN) 100 MG/5ML liquid Take 200 mg by mouth every 4 (four) hours as needed for cough. (notify MD if symptoms  continue for more than 48 hours)  . lidocaine (LIDODERM) 5 % Place 1 patch onto the skin daily. Remove & Discard patch within 12 hours or as directed by MD @ 8 pm  . NON FORMULARY Diet: Regular  . Nutritional Supplements (ENSURE ENLIVE PO) Take 1 Bottle by mouth daily.   . ondansetron (ZOFRAN-ODT) 4 MG disintegrating tablet Take 4 mg by mouth every 4 (four) hours as needed for nausea or vomiting.  . traMADol (ULTRAM) 50 MG tablet Take 1 tablet (50 mg total) by mouth every 4 (four) hours as needed.  . valsartan-hydrochlorothiazide (DIOVAN-HCT) 160-12.5 MG tablet Take 1 tablet by mouth daily.  Marland Kitchen venlafaxine XR (EFFEXOR-XR) 75 MG 24 hr capsule Take 1 capsule (75 mg total) by mouth daily.  . Wound Dressings (ALLEVYN ADHESIVE EX) Cleanse with normal saline, skin prep periwound, apply allevyn dressing every 5 days and as needed   No facility-administered encounter medications on file as of 08/09/2018.      SIGNIFICANT DIAGNOSTIC EXAMS  PREVIOUS:   03-07-18: chest x-ray: mass-like density centered in the superior mediastinum which may represent a goiter or mass.   03-13-18: thyroid ultrasound:  1. Moderate to large diffuse goiter with very heterogeneous thyroid tissue 2. Focal area of calcification left upper lobe   NO NEW EXAMS.   LABS REVIEWED: PREVIOUS  11-14-17: wbc 6.4; hgb 13.0; hct 38.5; mcv 104.7; plt 208; glucose 70; bun 29; creat 0.63; k+ 3.7; na++ 138; ca 8.7; liver normal albumin 3.1 tsh 1.342; vit B 12: 453; vit D 33.3; depakote: 27 03-13-18 ;tsh 1.021 03-14-18:  tsh 0.747; free T3: 3.1  NO NEW LABS.     Review of Systems  Reason unable to perform ROS: poor historian   Constitutional: Negative for malaise/fatigue.  Respiratory: Negative for cough.   Cardiovascular: Negative for chest pain and leg swelling.  Gastrointestinal: Negative for constipation and heartburn.  Musculoskeletal: Positive for joint pain. Negative for back pain.       Right knee pain   Skin: Negative.     Psychiatric/Behavioral: The patient is not nervous/anxious.      Physical Exam Constitutional:      General: She is not in acute distress.    Appearance: She is well-developed. She is not diaphoretic.  Neck:  Thyroid: Thyromegaly present.     Comments: Diffuse goiter  Cardiovascular:     Rate and Rhythm: Normal rate and regular rhythm.     Pulses: Normal pulses.     Heart sounds: Normal heart sounds.  Pulmonary:     Effort: Pulmonary effort is normal. No respiratory distress.     Breath sounds: Normal breath sounds.  Abdominal:     General: Bowel sounds are normal. There is no distension.     Palpations: Abdomen is soft.     Tenderness: There is no abdominal tenderness.  Musculoskeletal:     Right lower leg: Edema present.     Left lower leg: Edema present.     Comments: Is able to move all extremities scoliolosis  Trace bilateral lower extremity edema   Right knee is extremely painful to palpation with swelling and bruising present   Lymphadenopathy:     Cervical: No cervical adenopathy.  Skin:    General: Skin is warm and dry.  Neurological:     Mental Status: She is alert. Mental status is at baseline.  Psychiatric:        Behavior: Behavior normal.     ASSESSMENT/ PLAN:  TODAY:   1. Acute right knee pain: is without change: will setup an MRI for further evaluation of her pain.   MD is aware of resident's narcotic use and is in agreement with current plan of care. We will attempt to wean resident as apropriate   Ok Edwards NP [facility]  Contact [phone] Monday through Friday 8am- 5pm  After hours call [phone]

## 2018-08-10 ENCOUNTER — Ambulatory Visit (INDEPENDENT_AMBULATORY_CARE_PROVIDER_SITE_OTHER): Payer: Medicare Other | Admitting: Podiatry

## 2018-08-10 ENCOUNTER — Encounter: Payer: Self-pay | Admitting: Podiatry

## 2018-08-10 ENCOUNTER — Encounter: Payer: Self-pay | Admitting: Adult Health

## 2018-08-10 DIAGNOSIS — M79609 Pain in unspecified limb: Principal | ICD-10-CM

## 2018-08-10 DIAGNOSIS — M79676 Pain in unspecified toe(s): Secondary | ICD-10-CM

## 2018-08-10 DIAGNOSIS — B351 Tinea unguium: Secondary | ICD-10-CM

## 2018-08-10 DIAGNOSIS — I739 Peripheral vascular disease, unspecified: Secondary | ICD-10-CM

## 2018-08-10 DIAGNOSIS — L853 Xerosis cutis: Secondary | ICD-10-CM

## 2018-08-10 NOTE — Progress Notes (Signed)
Complaint:  Visit Type: Patient returns to my office for continued preventative foot care services. Complaint: Patient states" my nails have grown long and thick and become painful to walk and wear shoes" The patient presents for preventative foot care services. No changes to ROS  Podiatric Exam: Vascular: dorsalis pedis and posterior tibial pulses are palpable bilateral. Capillary return is immediate. Temperature gradient is WNL. Skin turgor WNL  Sensorium: Normal Semmes Weinstein monofilament test. Normal tactile sensation bilaterally. Nail Exam: Pt has thick disfigured discolored nails with subungual debris noted bilateral entire nail hallux through fifth toenails Ulcer Exam: There is no evidence of ulcer or pre-ulcerative changes or infection. Orthopedic Exam: Muscle tone and strength are WNL. No limitations in general ROM. No crepitus or effusions noted. Foot type and digits show no abnormalities. Bony prominences are unremarkable. Skin: No Porokeratosis. No infection or ulcers  Diagnosis:  Onychomycosis, , Pain in right toe, pain in left toes  Treatment & Plan Procedures and Treatment: Consent by patient was obtained for treatment procedures. The patient understood the discussion of treatment and procedures well. All questions were answered thoroughly reviewed. Debridement of mycotic and hypertrophic toenails, 1 through 5 bilateral and clearing of subungual debris. No ulceration, no infection noted. ABN signed for 2020.Marland Kitchen Return Visit-Office Procedure: Patient instructed to return to the office for a follow up visit 10 weeks  for continued evaluation and treatment.    Gardiner Barefoot DPM

## 2018-08-11 ENCOUNTER — Other Ambulatory Visit: Payer: Self-pay | Admitting: Internal Medicine

## 2018-08-11 ENCOUNTER — Other Ambulatory Visit: Payer: Self-pay | Admitting: Adult Health

## 2018-08-11 DIAGNOSIS — R609 Edema, unspecified: Secondary | ICD-10-CM

## 2018-08-11 DIAGNOSIS — Z9181 History of falling: Secondary | ICD-10-CM

## 2018-08-11 DIAGNOSIS — T148XXA Other injury of unspecified body region, initial encounter: Secondary | ICD-10-CM

## 2018-08-14 ENCOUNTER — Other Ambulatory Visit: Payer: Self-pay | Admitting: Adult Health

## 2018-08-14 ENCOUNTER — Ambulatory Visit
Admission: RE | Admit: 2018-08-14 | Discharge: 2018-08-14 | Disposition: A | Discharge status: Home / Self Care | Payer: Medicare Other | Source: Ambulatory Visit | Attending: Adult Health | Admitting: Adult Health

## 2018-08-14 DIAGNOSIS — Z9181 History of falling: Secondary | ICD-10-CM | POA: Diagnosis not present

## 2018-08-14 DIAGNOSIS — T148XXA Other injury of unspecified body region, initial encounter: Secondary | ICD-10-CM

## 2018-08-14 DIAGNOSIS — R609 Edema, unspecified: Secondary | ICD-10-CM | POA: Diagnosis not present

## 2018-08-14 DIAGNOSIS — M25561 Pain in right knee: Secondary | ICD-10-CM | POA: Diagnosis not present

## 2018-08-15 ENCOUNTER — Non-Acute Institutional Stay: Payer: Medicare Other | Admitting: Adult Health

## 2018-08-15 ENCOUNTER — Encounter: Payer: Self-pay | Admitting: Adult Health

## 2018-08-15 DIAGNOSIS — G301 Alzheimer's disease with late onset: Secondary | ICD-10-CM

## 2018-08-15 DIAGNOSIS — E538 Deficiency of other specified B group vitamins: Secondary | ICD-10-CM | POA: Diagnosis not present

## 2018-08-15 DIAGNOSIS — S83281A Other tear of lateral meniscus, current injury, right knee, initial encounter: Secondary | ICD-10-CM

## 2018-08-15 DIAGNOSIS — M545 Low back pain: Secondary | ICD-10-CM | POA: Diagnosis not present

## 2018-08-15 DIAGNOSIS — F0280 Dementia in other diseases classified elsewhere without behavioral disturbance: Secondary | ICD-10-CM | POA: Diagnosis not present

## 2018-08-15 DIAGNOSIS — G8929 Other chronic pain: Secondary | ICD-10-CM

## 2018-08-15 NOTE — Progress Notes (Signed)
[facility]   [facility] Room Number: [place] of Service:  ALF (13)   CODE STATUS: DNR  Allergies  Allergen Reactions  . Memantine Other (See Comments)    Upset stomach    Chief Complaint  Patient presents with  . Medical Management of Chronic Issues    Acute tear lateral meniscus right initial encounter; late onset alzheimer's disease without behavioral disturbance; chronic bilateral low back pain unspecified whether sciatica present; deficiency of other specified B group vitamins.     HPI:  She is a 85 year old long term resident of assisted living being seen for the management of her chronic illnesses: meniscus tear; alzheimer's disease; back pain ;vit B deficiency. She had an MRI done yesterday due to her continued right knee pain; she does have a tear present. There are no reports of changes in behaviors; no changes in appetite; no insomnia; no agitation   Past Medical History:  Diagnosis Date  . Anxiety    unspecified  . Chronic back pain unk  . Depression unk  . GERD (gastroesophageal reflux disease)   . History of hiatal hernia 08/08/2011  . Hyperlipidemia, unspecified   . Hypertension   . Lymphocytic colitis   . MGUS (monoclonal gammopathy of unknown significance)   . Osteopenia   . Scoliosis    born with   . Scoliosis deformity of spine     Past Surgical History:  Procedure Laterality Date  . ABDOMINAL HYSTERECTOMY  1973  . HERNIA REPAIR    . NECK SURGERY    . NISSEN FUNDOPLICATION      Social History   Socioeconomic History  . Marital status: Widowed    Spouse name: Not on file  . Number of children: 3  . Years of education: Not on file  . Highest education level: Not on file  Occupational History  . Not on file  Social Needs  . Financial resource strain: Not on file  . Food insecurity:    Worry: Not on file    Inability: Not on file  . Transportation needs:    Medical: Not on file    Non-medical: Not on file    Tobacco Use  . Smoking status: Never Smoker  . Smokeless tobacco: Never Used  Substance and Sexual Activity  . Alcohol use: No    Alcohol/week: 0.0 standard drinks  . Drug use: No  . Sexual activity: Not on file  Lifestyle  . Physical activity:    Days per week: Not on file    Minutes per session: Not on file  . Stress: Not on file  Relationships  . Social connections:    Talks on phone: Not on file    Gets together: Not on file    Attends religious service: Not on file    Active member of club or organization: Not on file    Attends meetings of clubs or organizations: Not on file    Relationship status: Not on file  . Intimate partner violence:    Fear of current or ex partner: Not on file    Emotionally abused: Not on file    Physically abused: Not on file    Forced sexual activity: Not on file  Other Topics Concern  . Not on file  Social History Narrative   Admitted to [facility] 10/07/2016   Widowed   3 children   Never smoker   No alcohol    DNR with HCPOA  Family History  Problem Relation Age of Onset  . Dementia Father   . Heart disease Maternal Grandmother   . Diabetes Maternal Grandfather   . Other Mother        Died during childbirth  . Heart disease Maternal Uncle   . Arthritis Other   . Diabetes type II Other   . Hypertension Other   . Heart attack Other   . Rheum arthritis Other       VITAL SIGNS BP (!) 127/50   Pulse 70   Temp (!) 97.2 F (36.2 C)   Resp 18   Ht 4\' 10"  (1.473 m)   Wt 143 lb 9.6 oz (65.1 kg)   SpO2 96%   BMI 30.01 kg/m   Outpatient Encounter Medications as of 08/15/2018  Medication Sig  . acetaminophen (TYLENOL) 325 MG tablet Take 650 mg by mouth 4 (four) times daily.  Marland Kitchen ALPRAZolam (XANAX XR) 1 MG 24 hr tablet Take 1 mg by mouth at bedtime.  . ALPRAZolam (XANAX) 1 MG tablet Take 0.5 mg by mouth every morning.   Marland Kitchen aspirin EC 81 MG tablet Take 81 mg by mouth See admin instructions. Daily on Monday ,  Wednesday, and Friday for antiplatelet  . cyanocobalamin (,VITAMIN B-12,) 1000 MCG/ML injection Inject 1000 mcg once a day on the 6th of the month  . divalproex (DEPAKOTE SPRINKLE) 125 MG capsule Take 250 mg by mouth 3 (three) times daily with meals.   . donepezil (ARICEPT) 10 MG tablet Take 10 mg by mouth at bedtime. Per Gurney Maxin, MD  . guaiFENesin (ROBITUSSIN) 100 MG/5ML liquid Take 200 mg by mouth every 4 (four) hours as needed for cough. (notify MD if symptoms continue for more than 48 hours)  . lidocaine (LIDODERM) 5 % Place 1 patch onto the skin daily. Remove & Discard patch within 12 hours or as directed by MD @ 8 pm  . NON FORMULARY Diet: Regular  . Nutritional Supplements (ENSURE ENLIVE PO) Take 1 Bottle by mouth daily.   . ondansetron (ZOFRAN-ODT) 4 MG disintegrating tablet Take 4 mg by mouth every 4 (four) hours as needed for nausea or vomiting.  . traMADol (ULTRAM) 50 MG tablet Take 1 tablet (50 mg total) by mouth every 4 (four) hours as needed.  . valsartan-hydrochlorothiazide (DIOVAN-HCT) 160-12.5 MG tablet Take 1 tablet by mouth daily.  Marland Kitchen venlafaxine XR (EFFEXOR-XR) 75 MG 24 hr capsule Take 1 capsule (75 mg total) by mouth daily.  . Wound Dressings (ALLEVYN ADHESIVE EX) Cleanse with normal saline, skin prep periwound, apply allevyn dressing every 5 days and as needed   No facility-administered encounter medications on file as of 08/15/2018.      SIGNIFICANT DIAGNOSTIC EXAMS  PREVIOUS:   03-07-18: chest x-ray: mass-like density centered in the superior mediastinum which may represent a goiter or mass.   03-13-18: thyroid ultrasound:  1. Moderate to large diffuse goiter with very heterogeneous thyroid tissue 2. Focal area of calcification left upper lobe   TODAY:   08-14-18: right knee MRI:  Subcutaneous hematoma anterior to the knee without underlying acute bony or joint abnormality. Horizontal tear anterior body of the lateral meniscus. Negative for ligament  tear.  LABS REVIEWED: PREVIOUS  11-14-17: wbc 6.4; hgb 13.0; hct 38.5; mcv 104.7; plt 208; glucose 70; bun 29; creat 0.63; k+ 3.7; na++ 138; ca 8.7; liver normal albumin 3.1 tsh 1.342; vit B 12: 453; vit D 33.3; depakote: 27 03-13-18 ;tsh 1.021 03-14-18:  tsh 0.747; free T3: 3.1  NO NEW LABS.     Review of Systems  Constitutional: Negative for malaise/fatigue.  Respiratory: Negative for cough and shortness of breath.   Cardiovascular: Negative for chest pain, palpitations and leg swelling.  Gastrointestinal: Negative for abdominal pain, constipation and heartburn.  Musculoskeletal: Positive for joint pain. Negative for back pain and myalgias.       Right knee pain   Skin: Negative.   Neurological: Negative for dizziness.  Psychiatric/Behavioral: The patient is not nervous/anxious.     Physical Exam Constitutional:      General: She is not in acute distress.    Appearance: She is well-developed. She is not diaphoretic.  Neck:     Musculoskeletal: No muscular tenderness.     Thyroid: Thyromegaly present.     Comments:  Diffuse goiter  Cardiovascular:     Rate and Rhythm: Normal rate and regular rhythm.     Heart sounds: Normal heart sounds.  Pulmonary:     Effort: Pulmonary effort is normal. No respiratory distress.     Breath sounds: Normal breath sounds.  Abdominal:     General: Bowel sounds are normal. There is no distension.     Palpations: Abdomen is soft.     Tenderness: There is no abdominal tenderness.  Musculoskeletal:     Right lower leg: Edema present.     Left lower leg: Edema present.     Comments: Is able to move all extremities scoliolosis  Trace bilateral lower extremity edema   Right knee is extremely painful to palpation with swelling and bruising present    Lymphadenopathy:     Cervical: No cervical adenopathy.  Skin:    General: Skin is warm and dry.  Neurological:     Mental Status: She is alert. Mental status is at baseline.  Psychiatric:         Mood and Affect: Mood normal.       ASSESSMENT/ PLAN:  TODAY:   1. Acute tear lateral meniscus right initial encounter: is without change will use ace wrap to knee with gentle compression; will setup ortho consult.   2. Deficency of other specified B group vitamins: is stable will continue monthly vit B 12 injection  3. Alzheimer's disease late onset: is without change: weight is 146 pounds; will continue aricept 10 mg daily   4.  Chronic bilateral low back pain: is stable will continue tylenol 650 mg four times daily; lidoderm patch to back; has ultram 50 mg every 4 hours as needed for pain.   PREVIOUS  5. Goiter: is without change: has normal tsh and free T3; was seen by endocrinology and will do watchful monitoring.   6. Essential hypertension: stable b/p 147/62: is stable will continue diovan hct: 160/12.5 mg daily asa three times weekly   7. Anxiety disorder: is stable will continue xanax 0.5 mg in the AM and 1 mg in the PM; takes effexor xr 75 mg daily   8. Mild episode of recurrent major depressive disorder: is stable will continue effexor xr 75 mg daily takes depakote 250 mg three times daily for help stabilize mood    Will give prevnar 32    MD is aware of resident's narcotic use and is in agreement with current plan of care. We will attempt to wean resident as apropriate   Ok Edwards NP [facility]  Contact [phone] Monday through Friday 8am- 5pm  After hours call [phone]

## 2018-08-20 DIAGNOSIS — S83281S Other tear of lateral meniscus, current injury, right knee, sequela: Secondary | ICD-10-CM | POA: Insufficient documentation

## 2018-08-23 DIAGNOSIS — S8001XA Contusion of right knee, initial encounter: Secondary | ICD-10-CM | POA: Diagnosis not present

## 2018-08-25 ENCOUNTER — Other Ambulatory Visit: Payer: Self-pay | Admitting: Adult Health

## 2018-08-25 MED ORDER — ALPRAZOLAM 1 MG PO TABS: 0.5000 mg | ORAL_TABLET | ORAL | 0 refills | Status: DC

## 2018-08-25 MED ORDER — ALPRAZOLAM ER 1 MG PO TB24: 1.0000 mg | ORAL_TABLET | Freq: Every day | ORAL | 0 refills | Status: DC

## 2018-08-26 ENCOUNTER — Encounter
Admission: RE | Admit: 2018-08-26 | Discharge: 2018-08-26 | Disposition: A | Discharge status: Home / Self Care | Payer: Medicare Other | Source: Ambulatory Visit | Attending: Internal Medicine | Admitting: Internal Medicine

## 2018-08-31 ENCOUNTER — Encounter: Payer: Self-pay | Admitting: Adult Health

## 2018-08-31 NOTE — Progress Notes (Signed)
[facility]:   [facility] Room Number: [place] of Service:  ALF (13)   CODE STATUS: DNR  Allergies  Allergen Reactions  . Memantine Other (See Comments)    Upset stomach    Chief Complaint  Patient presents with  . Acute Visit    Pain Management    HPI:    Past Medical History:  Diagnosis Date  . Anxiety    unspecified  . Chronic back pain unk  . Depression unk  . GERD (gastroesophageal reflux disease)   . History of hiatal hernia 08/08/2011  . Hyperlipidemia, unspecified   . Hypertension   . Lymphocytic colitis   . MGUS (monoclonal gammopathy of unknown significance)   . Osteopenia   . Scoliosis    born with   . Scoliosis deformity of spine     Past Surgical History:  Procedure Laterality Date  . ABDOMINAL HYSTERECTOMY  1973  . HERNIA REPAIR    . NECK SURGERY    . NISSEN FUNDOPLICATION      Social History   Socioeconomic History  . Marital status: Widowed    Spouse name: Not on file  . Number of children: 3  . Years of education: Not on file  . Highest education level: Not on file  Occupational History  . Not on file  Social Needs  . Financial resource strain: Not on file  . Food insecurity:    Worry: Not on file    Inability: Not on file  . Transportation needs:    Medical: Not on file    Non-medical: Not on file  Tobacco Use  . Smoking status: Never Smoker  . Smokeless tobacco: Never Used  Substance and Sexual Activity  . Alcohol use: No    Alcohol/week: 0.0 standard drinks  . Drug use: No  . Sexual activity: Not on file  Lifestyle  . Physical activity:    Days per week: Not on file    Minutes per session: Not on file  . Stress: Not on file  Relationships  . Social connections:    Talks on phone: Not on file    Gets together: Not on file    Attends religious service: Not on file    Active member of club or organization: Not on file    Attends meetings of clubs or organizations: Not on file   Relationship status: Not on file  . Intimate partner violence:    Fear of current or ex partner: Not on file    Emotionally abused: Not on file    Physically abused: Not on file    Forced sexual activity: Not on file  Other Topics Concern  . Not on file  Social History Narrative   Admitted to [facility] 10/07/2016   Widowed   3 children   Never smoker   No alcohol    DNR with HCPOA   Family History  Problem Relation Age of Onset  . Dementia Father   . Heart disease Maternal Grandmother   . Diabetes Maternal Grandfather   . Other Mother        Died during childbirth  . Heart disease Maternal Uncle   . Arthritis Other   . Diabetes type II Other   . Hypertension Other   . Heart attack Other   . Rheum arthritis Other       VITAL SIGNS BP (!) 156/68   Pulse 83   Temp 98 F (36.7 C)  Resp 18   Ht 4\' 10"  (1.473 m)   Wt 141 lb (64 kg)   SpO2 93%   BMI 29.47 kg/m   Outpatient Encounter Medications as of 08/31/2018  Medication Sig  . acetaminophen (TYLENOL) 325 MG tablet Take 650 mg by mouth 4 (four) times daily.  Marland Kitchen ALPRAZolam (XANAX XR) 1 MG 24 hr tablet Take 1 tablet (1 mg total) by mouth at bedtime.  . ALPRAZolam (XANAX) 1 MG tablet Take 0.5 tablets (0.5 mg total) by mouth every morning.  Marland Kitchen aspirin EC 81 MG tablet Take 81 mg by mouth See admin instructions. Daily on Monday , Wednesday, and Friday for antiplatelet  . cyanocobalamin (,VITAMIN B-12,) 1000 MCG/ML injection Inject 1000 mcg once a day on the 6th of the month  . divalproex (DEPAKOTE SPRINKLE) 125 MG capsule Take 250 mg by mouth 3 (three) times daily with meals.   . donepezil (ARICEPT) 10 MG tablet Take 10 mg by mouth at bedtime. Per Gurney Maxin, MD  . guaiFENesin (ROBITUSSIN) 100 MG/5ML liquid Take 200 mg by mouth every 4 (four) hours as needed for cough. (notify MD if symptoms continue for more than 48 hours)  . lidocaine (LIDODERM) 5 % Place 1 patch onto the skin daily. Remove & Discard patch  within 12 hours or as directed by MD @ 8 pm  . NON FORMULARY Diet: Regular  . Nutritional Supplements (ENSURE ENLIVE PO) Take 1 Bottle by mouth daily.   . ondansetron (ZOFRAN-ODT) 4 MG disintegrating tablet Take 4 mg by mouth every 4 (four) hours as needed for nausea or vomiting.  . traMADol (ULTRAM) 50 MG tablet Take 1 tablet (50 mg total) by mouth every 4 (four) hours as needed.  . valsartan-hydrochlorothiazide (DIOVAN-HCT) 160-12.5 MG tablet Take 1 tablet by mouth daily.  Marland Kitchen venlafaxine XR (EFFEXOR-XR) 75 MG 24 hr capsule Take 1 capsule (75 mg total) by mouth daily.  . Wound Dressings (ALLEVYN ADHESIVE EX) Cleanse with normal saline, skin prep periwound, apply allevyn dressing every 5 days and as needed   No facility-administered encounter medications on file as of 08/31/2018.      SIGNIFICANT DIAGNOSTIC EXAMS  PREVIOUS:   03-07-18: chest x-ray: mass-like density centered in the superior mediastinum which may represent a goiter or mass.   03-13-18: thyroid ultrasound:  1. Moderate to large diffuse goiter with very heterogeneous thyroid tissue 2. Focal area of calcification left upper lobe   TODAY:   08-14-18: right knee MRI:  Subcutaneous hematoma anterior to the knee without underlying acute bony or joint abnormality. Horizontal tear anterior body of the lateral meniscus. Negative for ligament tear.  LABS REVIEWED: PREVIOUS  11-14-17: wbc 6.4; hgb 13.0; hct 38.5; mcv 104.7; plt 208; glucose 70; bun 29; creat 0.63; k+ 3.7; na++ 138; ca 8.7; liver normal albumin 3.1 tsh 1.342; vit B 12: 453; vit D 33.3; depakote: 27 03-13-18 ;tsh 1.021 03-14-18:  tsh 0.747; free T3: 3.1  NO NEW LABS.     Review of Systems  Constitutional: Negative for malaise/fatigue.  Respiratory: Negative for cough and shortness of breath.   Cardiovascular: Negative for chest pain, palpitations and leg swelling.  Gastrointestinal: Negative for abdominal pain, constipation and heartburn.  Musculoskeletal:  Positive for joint pain. Negative for back pain and myalgias.       Right knee pain   Skin: Negative.   Neurological: Negative for dizziness.  Psychiatric/Behavioral: The patient is not nervous/anxious.     Physical Exam Constitutional:      General: She  is not in acute distress.    Appearance: She is well-developed. She is not diaphoretic.  Neck:     Musculoskeletal: No muscular tenderness.     Thyroid: Thyromegaly present.     Comments:  Diffuse goiter  Cardiovascular:     Rate and Rhythm: Normal rate and regular rhythm.     Heart sounds: Normal heart sounds.  Pulmonary:     Effort: Pulmonary effort is normal. No respiratory distress.     Breath sounds: Normal breath sounds.  Abdominal:     General: Bowel sounds are normal. There is no distension.     Palpations: Abdomen is soft.     Tenderness: There is no abdominal tenderness.  Musculoskeletal:     Right lower leg: Edema present.     Left lower leg: Edema present.     Comments: Is able to move all extremities scoliolosis  Trace bilateral lower extremity edema   Right knee is extremely painful to palpation with swelling and bruising present    Lymphadenopathy:     Cervical: No cervical adenopathy.  Skin:    General: Skin is warm and dry.  Neurological:     Mental Status: She is alert. Mental status is at baseline.  Psychiatric:        Mood and Affect: Mood normal.       ASSESSMENT/ PLAN:  TODAY:   1. Acute tear lateral meniscus right initial encounter: is without change will use ace wrap to knee with gentle compression; will setup ortho consult.   2. Deficency of other specified B group vitamins: is stable will continue monthly vit B 12 injection  3. Alzheimer's disease late onset: is without change: weight is 146 pounds; will continue aricept 10 mg daily   4.  Chronic bilateral low back pain: is stable will continue tylenol 650 mg four times daily; lidoderm patch to back; has ultram 50 mg every 4 hours as  needed for pain.   PREVIOUS  5. Goiter: is without change: has normal tsh and free T3; was seen by endocrinology and will do watchful monitoring.   6. Essential hypertension: stable b/p 147/62: is stable will continue diovan hct: 160/12.5 mg daily asa three times weekly   7. Anxiety disorder: is stable will continue xanax 0.5 mg in the AM and 1 mg in the PM; takes effexor xr 75 mg daily   8. Mild episode of recurrent major depressive disorder: is stable will continue effexor xr 75 mg daily takes depakote 250 mg three times daily for help stabilize mood    Will give prevnar 72    MD is aware of resident's narcotic use and is in agreement with current plan of care. We will attempt to wean resident as apropriate   Ok Edwards NP [facility]  Contact [phone] Monday through Friday 8am- 5pm  After hours call [phone]

## 2018-09-03 NOTE — Progress Notes (Signed)
This encounter was created in error - please disregard.

## 2018-09-05 ENCOUNTER — Other Ambulatory Visit: Payer: Self-pay | Admitting: Adult Health

## 2018-09-05 ENCOUNTER — Non-Acute Institutional Stay (SKILLED_NURSING_FACILITY): Payer: Medicare Other | Admitting: Adult Health

## 2018-09-05 ENCOUNTER — Encounter: Payer: Self-pay | Admitting: Adult Health

## 2018-09-05 DIAGNOSIS — S83281S Other tear of lateral meniscus, current injury, right knee, sequela: Secondary | ICD-10-CM | POA: Diagnosis not present

## 2018-09-05 MED ORDER — TRAMADOL HCL 50 MG PO TABS: 50.0000 mg | ORAL_TABLET | ORAL | 0 refills | Status: AC | PRN

## 2018-09-05 NOTE — Progress Notes (Signed)
[facility]:   [facility] Room Number: [place] of Service:  ALF (13)   CODE STATUS: DNR  Allergies  Allergen Reactions  . Memantine Other (See Comments)    Upset stomach    Chief Complaint  Patient presents with  . Acute Visit    Pain Management    HPI:  She is awaiting an orthopedic consult for an acute tear lateral meniscus right. She is taking scheduled tylenol. She does have tenderness to palpation; there is mild swelling present; her bruising is resolving. She does have pain when she moves; she is able to ambulate.    Past Medical History:  Diagnosis Date  . Anxiety    unspecified  . Chronic back pain unk  . Depression unk  . GERD (gastroesophageal reflux disease)   . History of hiatal hernia 08/08/2011  . Hyperlipidemia, unspecified   . Hypertension   . Lymphocytic colitis   . MGUS (monoclonal gammopathy of unknown significance)   . Osteopenia   . Scoliosis    born with   . Scoliosis deformity of spine     Past Surgical History:  Procedure Laterality Date  . ABDOMINAL HYSTERECTOMY  1973  . HERNIA REPAIR    . NECK SURGERY    . NISSEN FUNDOPLICATION      Social History   Socioeconomic History  . Marital status: Widowed    Spouse name: Not on file  . Number of children: 3  . Years of education: Not on file  . Highest education level: Not on file  Occupational History  . Not on file  Social Needs  . Financial resource strain: Not on file  . Food insecurity:    Worry: Not on file    Inability: Not on file  . Transportation needs:    Medical: Not on file    Non-medical: Not on file  Tobacco Use  . Smoking status: Never Smoker  . Smokeless tobacco: Never Used  Substance and Sexual Activity  . Alcohol use: No    Alcohol/week: 0.0 standard drinks  . Drug use: No  . Sexual activity: Not on file  Lifestyle  . Physical activity:    Days per week: Not on file    Minutes per session: Not on file  . Stress: Not on  file  Relationships  . Social connections:    Talks on phone: Not on file    Gets together: Not on file    Attends religious service: Not on file    Active member of club or organization: Not on file    Attends meetings of clubs or organizations: Not on file    Relationship status: Not on file  . Intimate partner violence:    Fear of current or ex partner: Not on file    Emotionally abused: Not on file    Physically abused: Not on file    Forced sexual activity: Not on file  Other Topics Concern  . Not on file  Social History Narrative   Admitted to [facility] 10/07/2016   Widowed   3 children   Never smoker   No alcohol    DNR with HCPOA   Family History  Problem Relation Age of Onset  . Dementia Father   . Heart disease Maternal Grandmother   . Diabetes Maternal Grandfather   . Other Mother        Died during childbirth  . Heart disease Maternal Uncle   . Arthritis Other   .  Diabetes type II Other   . Hypertension Other   . Heart attack Other   . Rheum arthritis Other       VITAL SIGNS BP (!) 156/68   Pulse 83   Temp 98 F (36.7 C)   Resp 18   Ht 4\' 10"  (1.473 m)   Wt 141 lb (64 kg)   SpO2 93%   BMI 29.47 kg/m   Outpatient Encounter Medications as of 09/05/2018  Medication Sig  . acetaminophen (TYLENOL) 325 MG tablet Take 650 mg by mouth 4 (four) times daily.  Marland Kitchen ALPRAZolam (XANAX XR) 1 MG 24 hr tablet Take 1 tablet (1 mg total) by mouth at bedtime.  . ALPRAZolam (XANAX) 1 MG tablet Take 0.5 tablets (0.5 mg total) by mouth every morning.  Marland Kitchen aspirin EC 81 MG tablet Take 81 mg by mouth See admin instructions. Daily on Monday , Wednesday, and Friday for antiplatelet  . cyanocobalamin (,VITAMIN B-12,) 1000 MCG/ML injection Inject 1000 mcg once a day on the 6th of the month  . divalproex (DEPAKOTE SPRINKLE) 125 MG capsule Take 250 mg by mouth 3 (three) times daily with meals.   . donepezil (ARICEPT) 10 MG tablet Take 10 mg by mouth at bedtime. Per  Gurney Maxin, MD  . guaiFENesin (ROBITUSSIN) 100 MG/5ML liquid Take 200 mg by mouth every 4 (four) hours as needed for cough. (notify MD if symptoms continue for more than 48 hours)  . lidocaine (LIDODERM) 5 % Place 1 patch onto the skin daily. Remove & Discard patch within 12 hours or as directed by MD @ 8 pm  . NON FORMULARY Diet: Regular  . Nutritional Supplements (ENSURE ENLIVE PO) Take 1 Bottle by mouth daily.   . ondansetron (ZOFRAN-ODT) 4 MG disintegrating tablet Take 4 mg by mouth every 4 (four) hours as needed for nausea or vomiting.  . traMADol (ULTRAM) 50 MG tablet Take 1 tablet (50 mg total) by mouth every 4 (four) hours as needed.  . valsartan-hydrochlorothiazide (DIOVAN-HCT) 160-12.5 MG tablet Take 1 tablet by mouth daily.  Marland Kitchen venlafaxine XR (EFFEXOR-XR) 75 MG 24 hr capsule Take 1 capsule (75 mg total) by mouth daily.  . Wound Dressings (ALLEVYN ADHESIVE EX) Cleanse with normal saline, skin prep periwound, apply allevyn dressing every 5 days and as needed   No facility-administered encounter medications on file as of 09/05/2018.      SIGNIFICANT DIAGNOSTIC EXAMS  PREVIOUS:   03-07-18: chest x-ray: mass-like density centered in the superior mediastinum which may represent a goiter or mass.   03-13-18: thyroid ultrasound:  1. Moderate to large diffuse goiter with very heterogeneous thyroid tissue 2. Focal area of calcification left upper lobe   08-14-18: right knee MRI:  Subcutaneous hematoma anterior to the knee without underlying acute bony or joint abnormality. Horizontal tear anterior body of the lateral meniscus. Negative for ligament tear.  NO NEW EXAMS.   LABS REVIEWED: PREVIOUS  11-14-17: wbc 6.4; hgb 13.0; hct 38.5; mcv 104.7; plt 208; glucose 70; bun 29; creat 0.63; k+ 3.7; na++ 138; ca 8.7; liver normal albumin 3.1 tsh 1.342; vit B 12: 453; vit D 33.3; depakote: 27 03-13-18 ;tsh 1.021 03-14-18:  tsh 0.747; free T3: 3.1  NO NEW LABS.     Review of Systems    Constitutional: Negative for malaise/fatigue.  Respiratory: Negative for cough and shortness of breath.   Cardiovascular: Negative for chest pain, palpitations and leg swelling.  Gastrointestinal: Negative for abdominal pain, constipation and heartburn.  Musculoskeletal: Positive for  joint pain. Negative for back pain and myalgias.       Right knee pain   Skin: Negative.   Neurological: Negative for dizziness.  Psychiatric/Behavioral: The patient is not nervous/anxious.     Physical Exam Constitutional:      General: She is not in acute distress.    Appearance: She is well-developed. She is not diaphoretic.  Neck:     Thyroid: Thyromegaly present.     Comments: Diffuse  Cardiovascular:     Rate and Rhythm: Normal rate and regular rhythm.     Heart sounds: Normal heart sounds.  Pulmonary:     Effort: Pulmonary effort is normal. No respiratory distress.     Breath sounds: Normal breath sounds.  Abdominal:     General: Bowel sounds are normal. There is no distension.     Palpations: Abdomen is soft.     Tenderness: There is no abdominal tenderness.  Musculoskeletal:     Right lower leg: No edema.     Left lower leg: No edema.     Comments:  Is able to move all extremities scoliolosis  Trace bilateral lower extremity edema   Right knee is extremely painful to palpation with swelling and bruising is resolving.   Lymphadenopathy:     Cervical: No cervical adenopathy.  Skin:    General: Skin is warm and dry.  Neurological:     Mental Status: She is alert. Mental status is at baseline.  Psychiatric:        Mood and Affect: Mood normal.      ASSESSMENT/ PLAN:  TODAY:   1. Acute tear lateral meniscus right sequela: is stable will continue routine tylenol; will continue ultram  50 mg every 4 hours as needed through 09-12-18   MD is aware of resident's narcotic use and is in agreement with current plan of care. We will attempt to wean resident as apropriate   Ok Edwards  NP [facility]  Contact [phone] Monday through Friday 8am- 5pm  After hours call [phone]

## 2018-09-06 DIAGNOSIS — K219 Gastro-esophageal reflux disease without esophagitis: Secondary | ICD-10-CM | POA: Diagnosis not present

## 2018-09-06 DIAGNOSIS — F0390 Unspecified dementia without behavioral disturbance: Secondary | ICD-10-CM | POA: Diagnosis not present

## 2018-09-06 DIAGNOSIS — R079 Chest pain, unspecified: Secondary | ICD-10-CM | POA: Diagnosis not present

## 2018-09-07 DIAGNOSIS — C4491 Basal cell carcinoma of skin, unspecified: Secondary | ICD-10-CM | POA: Diagnosis not present

## 2018-09-07 DIAGNOSIS — R208 Other disturbances of skin sensation: Secondary | ICD-10-CM | POA: Diagnosis not present

## 2018-09-07 DIAGNOSIS — L821 Other seborrheic keratosis: Secondary | ICD-10-CM | POA: Diagnosis not present

## 2018-09-07 DIAGNOSIS — D0461 Carcinoma in situ of skin of right upper limb, including shoulder: Secondary | ICD-10-CM | POA: Diagnosis not present

## 2018-09-07 DIAGNOSIS — D485 Neoplasm of uncertain behavior of skin: Secondary | ICD-10-CM | POA: Diagnosis not present

## 2018-09-13 ENCOUNTER — Encounter: Payer: Self-pay | Admitting: Adult Health

## 2018-09-13 ENCOUNTER — Non-Acute Institutional Stay: Payer: Medicare Other | Admitting: Adult Health

## 2018-09-13 DIAGNOSIS — S83281S Other tear of lateral meniscus, current injury, right knee, sequela: Secondary | ICD-10-CM | POA: Diagnosis not present

## 2018-09-13 DIAGNOSIS — F413 Other mixed anxiety disorders: Secondary | ICD-10-CM

## 2018-09-13 NOTE — Progress Notes (Signed)
[facility] Room Number: 250-P Place of Service:  ALF [phone]) Provider:  Durenda Age, NP  Patient Care Team: Kirk Ruths, MD as PCP - General (Internal Medicine) Gerlene Fee, NP as Nurse Practitioner (Geriatric Medicine)  Extended Emergency Contact Information Primary Emergency Contact: Seifer,[name] Address: [address]          [place], [place] [address] [place] Phone: [phone] Relation: Daughter  Code Status:  DNR  Goals of care: Advanced Directive information Advanced Directives 09/13/2018  Does Patient Have a Medical Advance Directive? Yes  Type of Advance Directive Out of facility DNR (pink MOST or yellow form)  Does patient want to make changes to medical advance directive? No - Patient declined  Copy of [place] in Chart? -  Would patient like information on creating a medical advance directive? -  Pre-existing out of facility DNR order (yellow form or pink MOST form) -     Chief Complaint  Patient presents with  . Acute Visit    Patient is seen for medication management to assess the continued use of tramadol.and alprazolam.    HPI:  Pt is a 85 y.o. female seen today for acute visit for medication management to assess the need for continued tramadol and alprazolam.  She is a long-term care resident of [facility].  She has a PMH of GERD, HTN, scoliosis, HLD, anxiety, and chronic back pain. She was seen today after going to the beauty shop. She had her hair done today. She is calm. She verbalized that her pain is well-controlled. She has only used the PRN Tramadol X 1 in 1 week.    Past Medical History:  Diagnosis Date  . Anxiety    unspecified  . Chronic back pain unk  . Depression unk  . GERD (gastroesophageal reflux disease)   . History of hiatal hernia 08/08/2011  . Hyperlipidemia, unspecified   . Hypertension   . Lymphocytic colitis   . MGUS  (monoclonal gammopathy of unknown significance)   . Osteopenia   . Scoliosis deformity of spine    Past Surgical History:  Procedure Laterality Date  . ABDOMINAL HYSTERECTOMY  1973  . HERNIA REPAIR    . NECK SURGERY    . NISSEN FUNDOPLICATION      Allergies  Allergen Reactions  . Memantine Other (See Comments)    Upset stomach    Outpatient Encounter Medications as of 09/13/2018  Medication Sig  . acetaminophen (TYLENOL) 325 MG tablet Take 650 mg by mouth 4 (four) times daily.  Marland Kitchen ALPRAZolam (XANAX XR) 1 MG 24 hr tablet Take 1 tablet (1 mg total) by mouth at bedtime.  . ALPRAZolam (XANAX) 1 MG tablet Take 0.5 tablets (0.5 mg total) by mouth every morning.  Marland Kitchen aspirin EC 81 MG tablet Take 81 mg by mouth See admin instructions. Daily on Monday , Wednesday, and Friday for antiplatelet  . cyanocobalamin (,VITAMIN B-12,) 1000 MCG/ML injection Inject 1000 mcg once a day on the 6th of the month  . divalproex (DEPAKOTE SPRINKLE) 125 MG capsule Take 250 mg by mouth 3 (three) times daily with meals.   . donepezil (ARICEPT) 10 MG tablet Take 10 mg by mouth at bedtime. Per Gurney Maxin, MD  . guaiFENesin (ROBITUSSIN) 100 MG/5ML liquid Take 200 mg by mouth every 4 (four) hours as needed for cough. (notify MD if symptoms continue for more than 48 hours)  . lidocaine (LIDODERM) 5 %  Place 1 patch onto the skin daily. Remove & Discard patch within 12 hours or as directed by MD @ 8 pm  . NON FORMULARY Diet: Regular  . Nutritional Supplements (ENSURE ENLIVE PO) Take 1 Bottle by mouth daily.   . ondansetron (ZOFRAN-ODT) 4 MG disintegrating tablet Take 4 mg by mouth every 4 (four) hours as needed for nausea or vomiting.  . valsartan-hydrochlorothiazide (DIOVAN-HCT) 160-12.5 MG tablet Take 1 tablet by mouth daily.  Marland Kitchen venlafaxine XR (EFFEXOR-XR) 75 MG 24 hr capsule Take 1 capsule (75 mg total) by mouth daily.  . Wound Dressings (ALLEVYN ADHESIVE EX) Cleanse with normal saline, skin prep periwound, apply  allevyn dressing every 5 days and as needed   No facility-administered encounter medications on file as of 09/13/2018.     Review of Systems  GENERAL: No change in appetite, no fatigue, no weight changes, no fever, chills or weakness MOUTH and THROAT: Denies oral discomfort, gingival pain or bleeding, pain from teeth or hoarseness   RESPIRATORY: no cough, SOB, DOE, wheezing, hemoptysis CARDIAC: No chest pain, edema or palpitations GI: No abdominal pain, diarrhea, constipation, heart burn, nausea or NEUROLOGICAL: Denies dizziness, syncope, numbness, or headache PSYCHIATRIC: Denies feelings of depression or anxiety. No report of hallucinations, insomnia, paranoia, or agitation    Immunization History  Administered Date(s) Administered  . Influenza,inj,Quad PF,6+ Mos 04/04/2013, 04/23/2014  . Influenza-Unspecified 05/01/2012, 04/04/2013, 04/25/2014, 04/26/2015, 04/26/2016, 04/19/2017, 05/11/2018  . Pneumococcal Conjugate-13 08/21/2018  . Tdap 07/20/2015   Pertinent  Health Maintenance Due  Topic Date Due  . PNA vac Low Risk Adult (2 of 2 - PPSV23) 08/22/2019  . INFLUENZA VACCINE  Completed  . DEXA SCAN  Discontinued   Fall Risk  06/17/2018 08/04/2016 06/02/2016 01/29/2016 12/09/2015  Falls in the past year? 1 Yes No No No  Number falls in past yr: 1 1 - - -  Comment - - - - -  Injury with Fall? 1 No - - -  Comment - - - - -  Risk Factor Category  - - - - -  Comment - - - - -  Risk for fall due to : History of fall(s);Impaired mobility;Medication side effect - - - -  Risk for fall due to: Comment - - - - -  Follow up - Falls evaluation completed - - -  Comment - - - - -     Vitals:   09/13/18 1138  BP: 139/62  Pulse: 78  Resp: 18  Temp: 97.6 F (36.4 C)  TempSrc: Oral  SpO2: 94%  Weight: 141 lb (64 kg)  Height: 4\' 10"  (1.473 m)   Body mass index is 29.47 kg/m.  Physical Exam  GENERAL APPEARANCE: Well nourished. In no acute distress. Normal body habitus SKIN:  Right  3rd finger has dry wound, no erythema MOUTH and THROAT: Lips are without lesions. Oral mucosa is moist and without lesions. Tongue is normal in shape, size, and color and without lesions RESPIRATORY: Breathing is even & unlabored, BS CTAB CARDIAC: RRR, no murmur,no extra heart sounds, no edema GI: Abdomen soft, normal BS, no masses, no tenderness EXTREMITIES:  Able to move X 4 extremities NEUROLOGICAL: There is no tremor. Speech is clear. Alert to self, disoriented to time and place. PSYCHIATRIC:  Affect and behavior are appropriate  Labs reviewed: Recent Labs    11/14/17 0430  NA 138  K 3.7  CL 101  CO2 28  GLUCOSE 70  BUN 29*  CREATININE 0.63  CALCIUM 8.7*  MG 2.0   Recent Labs    11/14/17 0430  AST 15  ALT 7*  ALKPHOS 31*  BILITOT 0.6  PROT 5.9*  ALBUMIN 3.1*   Recent Labs    11/14/17 0430  WBC 6.4  NEUTROABS 2.8  HGB 13.0  HCT 38.5  MCV 104.7*  PLT 208   Lab Results  Component Value Date   TSH 0.747 03/14/2018    Lab Results  Component Value Date   CHOL 198 10/19/2016   HDL 60 10/19/2016   LDLCALC 123 (H) 10/19/2016   TRIG 74 10/19/2016   CHOLHDL 3.3 10/19/2016      Assessment/Plan  1. Acute tear lateral meniscus, right, sequela - pain is well-controlled, will discontinue Tramadol and keep Acetaminophen PRN, fall precautions  2. Other mixed anxiety disorders - mood is stable, continue Xanax 1 mg 1 tab Q HS   Family/ staff Communication:  Discussed plan of care with resident and charge nurse.  Labs/tests ordered:  None  Goals of care:   Long-term care.   Durenda Age, NP [facility] [phone] (Monday-Friday 8:00 a.m. - 5:00 p.m.) [phone] (after hours)

## 2018-09-27 ENCOUNTER — Encounter
Admission: RE | Admit: 2018-09-27 | Discharge: 2018-09-27 | Disposition: A | Discharge status: Home / Self Care | Payer: Medicare Other | Source: Ambulatory Visit | Attending: Internal Medicine | Admitting: Internal Medicine

## 2018-09-27 DIAGNOSIS — D0461 Carcinoma in situ of skin of right upper limb, including shoulder: Secondary | ICD-10-CM | POA: Diagnosis not present

## 2018-10-06 ENCOUNTER — Non-Acute Institutional Stay: Payer: Medicare Other | Admitting: Adult Health

## 2018-10-06 ENCOUNTER — Encounter: Payer: Self-pay | Admitting: Adult Health

## 2018-10-06 DIAGNOSIS — F39 Unspecified mood [affective] disorder: Secondary | ICD-10-CM

## 2018-10-06 DIAGNOSIS — F0281 Dementia in other diseases classified elsewhere with behavioral disturbance: Secondary | ICD-10-CM

## 2018-10-06 DIAGNOSIS — F413 Other mixed anxiety disorders: Secondary | ICD-10-CM

## 2018-10-06 DIAGNOSIS — C44622 Squamous cell carcinoma of skin of right upper limb, including shoulder: Secondary | ICD-10-CM

## 2018-10-06 DIAGNOSIS — G301 Alzheimer's disease with late onset: Secondary | ICD-10-CM

## 2018-10-06 DIAGNOSIS — F02818 Dementia in other diseases classified elsewhere, unspecified severity, with other behavioral disturbance: Secondary | ICD-10-CM

## 2018-10-06 DIAGNOSIS — I10 Essential (primary) hypertension: Secondary | ICD-10-CM | POA: Diagnosis not present

## 2018-10-06 NOTE — Progress Notes (Signed)
[facility]  [facility] Room Number: 250-P Place of Service:  ALF [phone]) Provider:  Durenda Age, NP  Patient Care Team: Kirk Ruths, MD as PCP - General (Internal Medicine) Gerlene Fee, NP as Nurse Practitioner (Geriatric Medicine)  Extended Emergency Contact Information Primary Emergency Contact: Winegardner,[name] Address: [address]          [place], [place] [address] [place] Phone: [phone] Relation: Daughter  Code Status:  DNR  Goals of care: Advanced Directive information Advanced Directives 10/06/2018  Does Patient Have a Medical Advance Directive? Yes  Type of Advance Directive Out of facility DNR (pink MOST or yellow form)  Does patient want to make changes to medical advance directive? No - Patient declined  Copy of [place] in Chart? -  Would patient like information on creating a medical advance directive? -  Pre-existing out of facility DNR order (yellow form or pink MOST form) -     Chief Complaint  Patient presents with  . Medical Management of Chronic Issues    Routine TVAB visit    HPI:  Pt is a 85 y.o. Estes seen today for medical management of chronic diseases.  She is a long-term care resident of [facility]. She has a PMH of HTN, GERD, scoliosis, HLD, anxiety, and chronic back pain. She was seen in her room today. She was recently seen at [facility] wherein an excision of a lesion on her right dorsal hand which is possibly a squamous cell cancer. Noted to have 4 sutures and has no bleeding when surgical site was checked. She continues to take Xanax 1 mg @ bedtime and 0.5 mg in the morning. She was noted to be calm but got a little bit anxious when right dorsal hand wound was getting dressed. She kept on asking what is wrong with her. She was answered repeatedly but kept on repeating the same question. Latest BP 136/64, stable.    Past Medical History:  Diagnosis  Date  . Anxiety    unspecified  . Chronic back pain unk  . Depression unk  . GERD (gastroesophageal reflux disease)   . History of hiatal hernia 08/08/2011  . Hyperlipidemia, unspecified   . Hypertension   . Lymphocytic colitis   . MGUS (monoclonal gammopathy of unknown significance)   . Osteopenia   . Scoliosis deformity of spine    Past Surgical History:  Procedure Laterality Date  . ABDOMINAL HYSTERECTOMY  1973  . HERNIA REPAIR    . NECK SURGERY    . NISSEN FUNDOPLICATION      Allergies  Allergen Reactions  . Memantine Other (See Comments)    Upset stomach    Outpatient Encounter Medications as of 10/06/2018  Medication Sig  . acetaminophen (TYLENOL) 325 MG tablet Take 650 mg by mouth 4 (four) times daily.  Marland Kitchen ALPRAZolam (XANAX XR) 1 MG 24 hr tablet Take 1 tablet (1 mg total) by mouth at bedtime.  . ALPRAZolam (XANAX) 1 MG tablet Take 0.5 tablets (0.5 mg total) by mouth every morning.  Marland Kitchen aspirin EC 81 MG tablet Take 81 mg by mouth See admin instructions. Daily on Monday , Wednesday, and Friday for antiplatelet  . cyanocobalamin (,VITAMIN B-12,) 1000 MCG/ML injection Inject 1000 mcg once a day on the 6th of the month  . divalproex (DEPAKOTE SPRINKLE) 125 MG capsule Take 250 mg by mouth 3 (three) times daily with meals.   . donepezil (ARICEPT) 10 MG  tablet Take 10 mg by mouth at bedtime. Per Gurney Maxin, MD  . guaiFENesin (ROBITUSSIN) 100 MG/5ML liquid Take 200 mg by mouth every 4 (four) hours as needed for cough. (notify MD if symptoms continue for more than 48 hours)  . lidocaine (LIDODERM) 5 % Place 1 patch onto the skin daily. Remove & Discard patch within 12 hours or as directed by MD @ 8 pm  . NON FORMULARY Diet: Regular  . Nutritional Supplements (ENSURE ENLIVE PO) Take 1 Bottle by mouth daily.   . ondansetron (ZOFRAN-ODT) 4 MG disintegrating tablet Take 4 mg by mouth every 4 (four) hours as needed for nausea or vomiting.  . valsartan-hydrochlorothiazide  (DIOVAN-HCT) 160-12.5 MG tablet Take 1 tablet by mouth daily.  Marland Kitchen venlafaxine XR (EFFEXOR-XR) 75 MG 24 hr capsule Take 1 capsule (75 mg total) by mouth daily.   No facility-administered encounter medications on file as of 10/06/2018.     Review of Systems  GENERAL: No change in appetite, no fatigue, no weight changes, no fever, chills or weakness MOUTH and THROAT: Denies oral discomfort, gingival pain or bleeding RESPIRATORY: no cough, SOB, DOE, wheezing, hemoptysis CARDIAC: No chest pain, edema or palpitations GI: No abdominal pain, diarrhea, constipation, heart burn, nausea or vomiting NEUROLOGICAL: Denies dizziness, syncope, numbness, or headache PSYCHIATRIC: + anxiety    Immunization History  Administered Date(s) Administered  . Influenza,inj,Quad PF,6+ Mos 04/04/2013, 04/23/2014  . Influenza-Unspecified 05/01/2012, 04/04/2013, 04/25/2014, 04/26/2015, 04/26/2016, 04/19/2017, 05/11/2018  . Pneumococcal Conjugate-13 08/21/2018  . Tdap 07/20/2015   Pertinent  Health Maintenance Due  Topic Date Due  . PNA vac Low Risk Adult (2 of 2 - PPSV23) 08/22/2019  . INFLUENZA VACCINE  Completed  . DEXA SCAN  Discontinued   Fall Risk  06/17/2018 08/04/2016 06/02/2016 01/29/2016 12/09/2015  Falls in the past year? 1 Yes No No No  Number falls in past yr: 1 1 - - -  Comment - - - - -  Injury with Fall? 1 No - - -  Comment - - - - -  Risk Factor Category  - - - - -  Comment - - - - -  Risk for fall due to : History of fall(s);Impaired mobility;Medication side effect - - - -  Risk for fall due to: Comment - - - - -  Follow up - Falls evaluation completed - - -  Comment - - - - -     Vitals:   10/06/18 0839  BP: 136/64  Pulse: 94  Resp: 18  Temp: (!) 96.8 F (36 C)  TempSrc: Oral  SpO2: 98%  Weight: 136 lb 9.6 oz (Cindy kg)  Height: 4\' 10"  (1.473 m)   Body mass index is 28.55 kg/m.  Physical Exam  GENERAL APPEARANCE: Well nourished. In no acute distress. Normal body habitus SKIN:   Right dorsal hand surgical site with 4 sutures, dry and no erythema, 1+ edema of fingers MOUTH and THROAT: Lips are without lesions. Oral mucosa is moist and without lesions.  RESPIRATORY: Breathing is even & unlabored, BS CTAB CARDIAC: RRR, no murmur,no extra heart sounds GI: Abdomen soft, normal BS, no masses, no tenderness EXTREMITIES:  Able to move X 4 extremities NEUROLOGICAL: There is no tremor. Speech is clear. Alert to self, disoriented to time and place. PSYCHIATRIC:  Affect and behavior are appropriate   Labs reviewed: Recent Labs    11/14/17 0430  NA 138  K 3.7  CL 101  CO2 28  GLUCOSE 70  BUN 29*  CREATININE 0.63  CALCIUM 8.7*  MG 2.0   Recent Labs    11/14/17 0430  AST 15  ALT 7*  ALKPHOS 31*  BILITOT 0.6  PROT 5.9*  ALBUMIN 3.1*   Recent Labs    11/14/17 0430  WBC 6.4  NEUTROABS 2.8  HGB 13.0  HCT 38.5  MCV 104.7*  PLT 208   Lab Results  Component Value Date   TSH 0.747 03/14/2018    Lab Results  Component Value Date   CHOL 198 10/19/2016   HDL 60 10/19/2016   LDLCALC 123 (H) 10/19/2016   TRIG 74 10/19/2016   CHOLHDL 3.3 10/19/2016    Assessment/Plan   1. Essential hypertension -Well-controlled, continue losartan-hydrochlorothiazide 160-12.5 mg 1 tab daily  2. Other mixed anxiety disorders -Has occasional anxiety, continue Xanax 1 mg 1 tab at bedtime and 0.5 mg every morning  3. Mood disorder (HCC) -Stable, continue Depakote sprinkles 125 mg 2 capsules = 250 mg TID with meals  4. Squamous cell cancer of skin of right hand -S/P excision of right dorsal hand with dressing, follows up with  dermatology, continue treatment daily  5. Late onset Alzheimer's disease with behavioral disturbance (HCC) -Continue Aricept 10 mg 1 tab at bedtime supportive care and fall precautions    Family/ staff Communication: Discussed plan of care with resident and charge nurse.  Labs/tests ordered: None  Goals of care:   Long-term care.    Durenda Age, NP [facility] and Adult Medicine [phone] (Monday-Friday 8:00 a.m. - 5:00 p.m.) [phone] (after hours)

## 2018-10-19 ENCOUNTER — Ambulatory Visit: Payer: Medicare Other | Admitting: Podiatry

## 2018-10-24 ENCOUNTER — Other Ambulatory Visit: Payer: Self-pay | Admitting: Adult Health

## 2018-10-24 MED ORDER — ALPRAZOLAM 1 MG PO TABS: 0.5000 mg | ORAL_TABLET | ORAL | 0 refills | Status: DC

## 2018-11-02 ENCOUNTER — Other Ambulatory Visit: Payer: Self-pay

## 2018-11-02 MED ORDER — ALPRAZOLAM ER 1 MG PO TB24: 1.0000 mg | ORAL_TABLET | Freq: Every day | ORAL | 0 refills | Status: DC

## 2018-11-02 MED ORDER — ALPRAZOLAM 1 MG PO TABS: 0.5000 mg | ORAL_TABLET | ORAL | 0 refills | Status: DC

## 2018-11-09 DIAGNOSIS — I10 Essential (primary) hypertension: Secondary | ICD-10-CM | POA: Diagnosis not present

## 2018-11-09 DIAGNOSIS — F325 Major depressive disorder, single episode, in full remission: Secondary | ICD-10-CM | POA: Diagnosis not present

## 2018-11-09 DIAGNOSIS — F0390 Unspecified dementia without behavioral disturbance: Secondary | ICD-10-CM | POA: Diagnosis not present

## 2018-11-09 DIAGNOSIS — Z593 Problems related to living in residential institution: Secondary | ICD-10-CM | POA: Diagnosis not present

## 2018-11-24 ENCOUNTER — Encounter
Admission: RE | Admit: 2018-11-24 | Discharge: 2018-11-24 | Disposition: A | Discharge status: Home / Self Care | Payer: Medicare Other | Source: Ambulatory Visit | Attending: Internal Medicine | Admitting: Internal Medicine

## 2018-11-30 ENCOUNTER — Other Ambulatory Visit: Payer: Self-pay | Admitting: Adult Health

## 2018-11-30 MED ORDER — ALPRAZOLAM 1 MG PO TABS: 0.5000 mg | ORAL_TABLET | ORAL | 0 refills | Status: DC

## 2018-11-30 MED ORDER — ALPRAZOLAM ER 1 MG PO TB24: 1.0000 mg | ORAL_TABLET | Freq: Every day | ORAL | 0 refills | Status: DC

## 2018-12-07 ENCOUNTER — Other Ambulatory Visit: Payer: Self-pay | Admitting: Adult Health

## 2018-12-14 ENCOUNTER — Non-Acute Institutional Stay: Payer: Medicare Other | Admitting: Adult Health

## 2018-12-14 ENCOUNTER — Encounter: Payer: Self-pay | Admitting: Adult Health

## 2018-12-14 DIAGNOSIS — F02818 Dementia in other diseases classified elsewhere, unspecified severity, with other behavioral disturbance: Secondary | ICD-10-CM

## 2018-12-14 DIAGNOSIS — F0281 Dementia in other diseases classified elsewhere with behavioral disturbance: Secondary | ICD-10-CM | POA: Diagnosis not present

## 2018-12-14 DIAGNOSIS — I10 Essential (primary) hypertension: Secondary | ICD-10-CM

## 2018-12-14 DIAGNOSIS — F330 Major depressive disorder, recurrent, mild: Secondary | ICD-10-CM

## 2018-12-14 DIAGNOSIS — F413 Other mixed anxiety disorders: Secondary | ICD-10-CM

## 2018-12-14 DIAGNOSIS — G301 Alzheimer's disease with late onset: Secondary | ICD-10-CM

## 2018-12-14 DIAGNOSIS — E538 Deficiency of other specified B group vitamins: Secondary | ICD-10-CM | POA: Diagnosis not present

## 2018-12-14 DIAGNOSIS — F39 Unspecified mood [affective] disorder: Secondary | ICD-10-CM | POA: Diagnosis not present

## 2018-12-14 NOTE — Progress Notes (Signed)
Location:  [facility] Room Number: 250-P Place of Service:  ALF [phone]) Provider:  Durenda Age, DNP, FNP-BC  Patient Care Team: Kirk Ruths, MD as PCP - General (Internal Medicine) Cindy Fee, NP as Nurse Practitioner (Geriatric Medicine)  Extended Emergency Contact Information Primary Emergency Contact: Spath,[name] Address: [address]          [place], [place] [address] [place] Phone: [phone] Relation: Daughter  Code Status:  DNR  Goals of care: Advanced Directive information Advanced Directives 10/06/2018  Does Patient Have a Medical Advance Directive? Yes  Type of Advance Directive Out of facility DNR (pink MOST or yellow form)  Does patient want to make changes to medical advance directive? No - Patient declined  Copy of [place] in Chart? -  Would patient like information on creating a medical advance directive? -  Pre-existing out of facility DNR order (yellow form or pink MOST form) -     Chief Complaint  Patient presents with  . Medical Management of Chronic Issues    Routine TVAB visit    HPI:  Cindy Estes is a 85 y.o. female seen today for medical management of chronic diseases.  She is a long-term care resident of [facility]. She has a PMH of HTN, GERD, scoliosis, HLD, anxiety, and chronic back pain. She was seen in the room today. She was crying and said that she doesn't like it here. When followed up later, to check how she is doing, she was reported to be watching tv and happy. She currently takes Venlafaxine for depression. BPs are as follows: 136/66, 132/62, 128/68, 130/61.  She currently takes losartan hydrochlorothiazide for hypertension.   Past Medical History:  Diagnosis Date  . Anxiety    unspecified  . Chronic back pain unk  . Depression unk  . GERD (gastroesophageal reflux disease)   . History of hiatal hernia 08/08/2011  . Hyperlipidemia, unspecified   .  Hypertension   . Lymphocytic colitis   . MGUS (monoclonal gammopathy of unknown significance)   . Osteopenia   . Scoliosis deformity of spine    Past Surgical History:  Procedure Laterality Date  . ABDOMINAL HYSTERECTOMY  1973  . HERNIA REPAIR    . NECK SURGERY    . NISSEN FUNDOPLICATION      Allergies  Allergen Reactions  . Memantine Other (See Comments)    Upset stomach    Outpatient Encounter Medications as of 12/14/2018  Medication Sig  . acetaminophen (TYLENOL) 325 MG tablet Take 650 mg by mouth 4 (four) times daily.  Marland Kitchen ALPRAZolam (XANAX XR) 1 MG 24 hr tablet Take 1 tablet (1 mg total) by mouth at bedtime.  . ALPRAZolam (XANAX) 1 MG tablet Take 0.5 tablets (0.5 mg total) by mouth every morning for 30 days.  Marland Kitchen aspirin EC 81 MG tablet Take 81 mg by mouth See admin instructions. Daily on Monday , Wednesday, and Friday for antiplatelet  . cyanocobalamin (,VITAMIN B-12,) 1000 MCG/ML injection Inject 1000 mcg once a day on the 6th of the month  . divalproex (DEPAKOTE SPRINKLE) 125 MG capsule Take 250 mg by mouth 3 (three) times daily with meals.   . donepezil (ARICEPT) 10 MG tablet Take 10 mg by mouth at bedtime. Per Gurney Maxin, MD  . lidocaine (LIDODERM) 5 % Place 1 patch onto the skin daily. Remove & Discard patch within 12 hours or as directed by MD @ 8 pm  . NON  FORMULARY Diet: Regular  . Nutritional Supplements (ENSURE ENLIVE PO) Take 1 Bottle by mouth daily.   . ondansetron (ZOFRAN-ODT) 4 MG disintegrating tablet Take 4 mg by mouth every 4 (four) hours as needed for nausea or vomiting.  . valsartan-hydrochlorothiazide (DIOVAN-HCT) 160-12.5 MG tablet Take 1 tablet by mouth daily.  Marland Kitchen venlafaxine XR (EFFEXOR-XR) 75 MG 24 hr capsule Take 1 capsule (75 mg total) by mouth daily.  . [DISCONTINUED] guaiFENesin (ROBITUSSIN) 100 MG/5ML liquid Take 200 mg by mouth every 4 (four) hours as needed for cough. (notify MD if symptoms continue for more than 48 hours)   No  facility-administered encounter medications on file as of 12/14/2018.     Review of Systems  GENERAL: No change in appetite, no fatigue, no weight changes, no fever, chills or weakness MOUTH and THROAT: Denies oral discomfort, gingival pain or bleeding RESPIRATORY: no cough, SOB, DOE, wheezing, hemoptysis CARDIAC: No chest pain, edema or palpitations GI: No abdominal pain, diarrhea, constipation, heart burn, nausea or vomiting GU: Denies dysuria, frequency, hematuria, or discharge NEUROLOGICAL: Denies dizziness, syncope, numbness, or headache PSYCHIATRIC: cries    Immunization History  Administered Date(s) Administered  . Influenza,inj,Quad PF,6+ Mos 04/04/2013, 04/23/2014  . Influenza-Unspecified 05/01/2012, 04/04/2013, 04/25/2014, 04/26/2015, 04/26/2016, 04/19/2017, 05/11/2018  . Pneumococcal Conjugate-13 08/21/2018  . Tdap 07/20/2015   Pertinent  Health Maintenance Due  Topic Date Due  . INFLUENZA VACCINE  02/24/2019  . PNA vac Low Risk Adult (2 of 2 - PPSV23) 08/22/2019  . DEXA SCAN  Discontinued   Fall Risk  06/17/2018 08/04/2016 06/02/2016 01/29/2016 12/09/2015  Falls in the past year? 1 Yes No No No  Number falls in past yr: 1 1 - - -  Comment - - - - -  Injury with Fall? 1 No - - -  Comment - - - - -  Risk Factor Category  - - - - -  Comment - - - - -  Risk for fall due to : History of fall(s);Impaired mobility;Medication side effect - - - -  Risk for fall due to: Comment - - - - -  Follow up - Falls evaluation completed - - -  Comment - - - - -     Vitals:   12/14/18 1200  BP: 132/62  Pulse: 72  Resp: 18  Temp: (!) 96.9 F (36.1 C)  TempSrc: Oral  SpO2: 97%  Weight: 134 lb 6.4 oz (61 kg)  Height: 4\' 10"  (1.473 m)   Body mass index is 28.09 kg/m.  Physical Exam  GENERAL APPEARANCE: Well nourished. In no acute distress.  SKIN:  Skin is warm and dry.  MOUTH and THROAT: Lips are without lesions. Oral mucosa is moist and without lesions. Tongue is normal  in shape, size, and color and without lesions RESPIRATORY: Breathing is even & unlabored, BS CTAB CARDIAC: RRR, no murmur,no extra heart sounds, no edema GI: Abdomen soft, normal BS, no masses, no tenderness EXTREMITIES:  Able to move x 4 extremities NEUROLOGICAL: There is no tremor. Speech is clear. Alert to self, disoriented to time and place. PSYCHIATRIC:  Sad affect.  Labs reviewed:  Lab Results  Component Value Date   TSH 0.747 03/14/2018    Lab Results  Component Value Date   CHOL 198 10/19/2016   HDL 60 10/19/2016   LDLCALC 123 (H) 10/19/2016   TRIG 74 10/19/2016   CHOLHDL 3.3 10/19/2016    Assessment/Plan  1. Essential hypertension -Well-controlled, continue losartan-hydrochlorothiazide 160-12.5 mg 1 tab daily  2.  Other mixed anxiety disorders -Stable, continue Xanax 1 mg 1/2 tab Q AM and 1 tab Q HS  3. Mood disorder (HCC) - Stable, continue Depakote sprinkles 125 mg 2 capsules 3 times a day  4. Mild episode of recurrent major depressive disorder (HCC) - was crying but later she was reported to be happy watching tv, continue venlafaxine ER 75 mg 1 capsule daily  5. Vitamin B12 deficiency Lab Results  Component Value Date   VITAMINB12 453 11/14/2017  -Continue cyanocobalamin 1000 mg injection monthly  6. Late onset Alzheimer's disease with behavioral disturbance ([place]) -Stable, continue Aricept 10 mg 1 tab at bedtime, supportive care and fall precautions      Family/ staff Communication: Discussed plan of care with resident and charge nurse.  Labs/tests ordered: None  Goals of care:   Long-term care.   Durenda Age, DNP, FNP-BC [facility] [phone] (Monday-Friday 8:00 a.m. - 5:00 p.m.) [phone] (after hours)

## 2018-12-23 DIAGNOSIS — R0602 Shortness of breath: Secondary | ICD-10-CM | POA: Diagnosis not present

## 2018-12-25 ENCOUNTER — Encounter
Admission: RE | Admit: 2018-12-25 | Discharge: 2018-12-25 | Disposition: A | Discharge status: Home / Self Care | Payer: Medicare Other | Source: Ambulatory Visit | Attending: Internal Medicine | Admitting: Internal Medicine

## 2018-12-28 ENCOUNTER — Other Ambulatory Visit: Payer: Self-pay | Admitting: Adult Health

## 2018-12-28 MED ORDER — ALPRAZOLAM ER 1 MG PO TB24: 1.0000 mg | ORAL_TABLET | Freq: Every day | ORAL | 0 refills | Status: AC

## 2018-12-28 MED ORDER — ALPRAZOLAM 1 MG PO TABS: 0.5000 mg | ORAL_TABLET | ORAL | 0 refills | Status: AC

## 2019-01-02 DIAGNOSIS — Z20828 Contact with and (suspected) exposure to other viral communicable diseases: Secondary | ICD-10-CM | POA: Diagnosis not present

## 2019-01-24 ENCOUNTER — Encounter
Admission: RE | Admit: 2019-01-24 | Discharge: 2019-01-24 | Disposition: A | Discharge status: Home / Self Care | Payer: Medicare Other | Source: Ambulatory Visit | Attending: Internal Medicine | Admitting: Internal Medicine

## 2019-02-14 DIAGNOSIS — M419 Scoliosis, unspecified: Secondary | ICD-10-CM | POA: Diagnosis not present

## 2019-02-14 DIAGNOSIS — M6281 Muscle weakness (generalized): Secondary | ICD-10-CM | POA: Diagnosis not present

## 2019-02-14 DIAGNOSIS — F0280 Dementia in other diseases classified elsewhere without behavioral disturbance: Secondary | ICD-10-CM | POA: Diagnosis not present

## 2019-02-14 DIAGNOSIS — G301 Alzheimer's disease with late onset: Secondary | ICD-10-CM | POA: Diagnosis not present

## 2019-02-14 DIAGNOSIS — R279 Unspecified lack of coordination: Secondary | ICD-10-CM | POA: Diagnosis not present

## 2019-02-14 DIAGNOSIS — Z9181 History of falling: Secondary | ICD-10-CM | POA: Diagnosis not present

## 2019-02-14 DIAGNOSIS — R262 Difficulty in walking, not elsewhere classified: Secondary | ICD-10-CM | POA: Diagnosis not present

## 2019-02-15 DIAGNOSIS — F0280 Dementia in other diseases classified elsewhere without behavioral disturbance: Secondary | ICD-10-CM | POA: Diagnosis not present

## 2019-02-15 DIAGNOSIS — M6281 Muscle weakness (generalized): Secondary | ICD-10-CM | POA: Diagnosis not present

## 2019-02-15 DIAGNOSIS — R279 Unspecified lack of coordination: Secondary | ICD-10-CM | POA: Diagnosis not present

## 2019-02-15 DIAGNOSIS — Z9181 History of falling: Secondary | ICD-10-CM | POA: Diagnosis not present

## 2019-02-15 DIAGNOSIS — R262 Difficulty in walking, not elsewhere classified: Secondary | ICD-10-CM | POA: Diagnosis not present

## 2019-02-15 DIAGNOSIS — G301 Alzheimer's disease with late onset: Secondary | ICD-10-CM | POA: Diagnosis not present

## 2019-02-19 DIAGNOSIS — G301 Alzheimer's disease with late onset: Secondary | ICD-10-CM | POA: Diagnosis not present

## 2019-02-19 DIAGNOSIS — M6281 Muscle weakness (generalized): Secondary | ICD-10-CM | POA: Diagnosis not present

## 2019-02-19 DIAGNOSIS — R262 Difficulty in walking, not elsewhere classified: Secondary | ICD-10-CM | POA: Diagnosis not present

## 2019-02-19 DIAGNOSIS — R279 Unspecified lack of coordination: Secondary | ICD-10-CM | POA: Diagnosis not present

## 2019-02-19 DIAGNOSIS — F0280 Dementia in other diseases classified elsewhere without behavioral disturbance: Secondary | ICD-10-CM | POA: Diagnosis not present

## 2019-02-19 DIAGNOSIS — Z9181 History of falling: Secondary | ICD-10-CM | POA: Diagnosis not present

## 2019-02-20 DIAGNOSIS — R279 Unspecified lack of coordination: Secondary | ICD-10-CM | POA: Diagnosis not present

## 2019-02-20 DIAGNOSIS — F0280 Dementia in other diseases classified elsewhere without behavioral disturbance: Secondary | ICD-10-CM | POA: Diagnosis not present

## 2019-02-20 DIAGNOSIS — G301 Alzheimer's disease with late onset: Secondary | ICD-10-CM | POA: Diagnosis not present

## 2019-02-20 DIAGNOSIS — M6281 Muscle weakness (generalized): Secondary | ICD-10-CM | POA: Diagnosis not present

## 2019-02-20 DIAGNOSIS — Z9181 History of falling: Secondary | ICD-10-CM | POA: Diagnosis not present

## 2019-02-20 DIAGNOSIS — R262 Difficulty in walking, not elsewhere classified: Secondary | ICD-10-CM | POA: Diagnosis not present

## 2019-02-21 DIAGNOSIS — F0280 Dementia in other diseases classified elsewhere without behavioral disturbance: Secondary | ICD-10-CM | POA: Diagnosis not present

## 2019-02-21 DIAGNOSIS — G301 Alzheimer's disease with late onset: Secondary | ICD-10-CM | POA: Diagnosis not present

## 2019-02-21 DIAGNOSIS — R279 Unspecified lack of coordination: Secondary | ICD-10-CM | POA: Diagnosis not present

## 2019-02-21 DIAGNOSIS — R262 Difficulty in walking, not elsewhere classified: Secondary | ICD-10-CM | POA: Diagnosis not present

## 2019-02-21 DIAGNOSIS — Z9181 History of falling: Secondary | ICD-10-CM | POA: Diagnosis not present

## 2019-02-21 DIAGNOSIS — M6281 Muscle weakness (generalized): Secondary | ICD-10-CM | POA: Diagnosis not present

## 2019-02-23 DIAGNOSIS — F0280 Dementia in other diseases classified elsewhere without behavioral disturbance: Secondary | ICD-10-CM | POA: Diagnosis not present

## 2019-02-23 DIAGNOSIS — R262 Difficulty in walking, not elsewhere classified: Secondary | ICD-10-CM | POA: Diagnosis not present

## 2019-02-23 DIAGNOSIS — Z9181 History of falling: Secondary | ICD-10-CM | POA: Diagnosis not present

## 2019-02-23 DIAGNOSIS — M6281 Muscle weakness (generalized): Secondary | ICD-10-CM | POA: Diagnosis not present

## 2019-02-23 DIAGNOSIS — R279 Unspecified lack of coordination: Secondary | ICD-10-CM | POA: Diagnosis not present

## 2019-02-23 DIAGNOSIS — G301 Alzheimer's disease with late onset: Secondary | ICD-10-CM | POA: Diagnosis not present

## 2019-02-24 ENCOUNTER — Encounter
Admission: RE | Admit: 2019-02-24 | Discharge: 2019-02-24 | Disposition: A | Discharge status: Home / Self Care | Payer: Medicare Other | Source: Ambulatory Visit | Attending: Internal Medicine | Admitting: Internal Medicine

## 2019-03-01 DIAGNOSIS — Z9181 History of falling: Secondary | ICD-10-CM | POA: Diagnosis not present

## 2019-03-01 DIAGNOSIS — R262 Difficulty in walking, not elsewhere classified: Secondary | ICD-10-CM | POA: Diagnosis not present

## 2019-03-01 DIAGNOSIS — M6281 Muscle weakness (generalized): Secondary | ICD-10-CM | POA: Diagnosis not present

## 2019-03-01 DIAGNOSIS — M419 Scoliosis, unspecified: Secondary | ICD-10-CM | POA: Diagnosis not present

## 2019-03-01 DIAGNOSIS — G301 Alzheimer's disease with late onset: Secondary | ICD-10-CM | POA: Diagnosis not present

## 2019-03-01 DIAGNOSIS — I10 Essential (primary) hypertension: Secondary | ICD-10-CM | POA: Diagnosis not present

## 2019-03-01 DIAGNOSIS — F0390 Unspecified dementia without behavioral disturbance: Secondary | ICD-10-CM | POA: Diagnosis not present

## 2019-03-01 DIAGNOSIS — F325 Major depressive disorder, single episode, in full remission: Secondary | ICD-10-CM | POA: Diagnosis not present

## 2019-03-01 DIAGNOSIS — F0280 Dementia in other diseases classified elsewhere without behavioral disturbance: Secondary | ICD-10-CM | POA: Diagnosis not present

## 2019-03-01 DIAGNOSIS — Z593 Problems related to living in residential institution: Secondary | ICD-10-CM | POA: Diagnosis not present

## 2019-03-01 DIAGNOSIS — R279 Unspecified lack of coordination: Secondary | ICD-10-CM | POA: Diagnosis not present

## 2019-03-05 DIAGNOSIS — G301 Alzheimer's disease with late onset: Secondary | ICD-10-CM | POA: Diagnosis not present

## 2019-03-05 DIAGNOSIS — M6281 Muscle weakness (generalized): Secondary | ICD-10-CM | POA: Diagnosis not present

## 2019-03-05 DIAGNOSIS — Z9181 History of falling: Secondary | ICD-10-CM | POA: Diagnosis not present

## 2019-03-05 DIAGNOSIS — F0280 Dementia in other diseases classified elsewhere without behavioral disturbance: Secondary | ICD-10-CM | POA: Diagnosis not present

## 2019-03-05 DIAGNOSIS — R262 Difficulty in walking, not elsewhere classified: Secondary | ICD-10-CM | POA: Diagnosis not present

## 2019-03-05 DIAGNOSIS — R279 Unspecified lack of coordination: Secondary | ICD-10-CM | POA: Diagnosis not present

## 2019-03-06 DIAGNOSIS — R262 Difficulty in walking, not elsewhere classified: Secondary | ICD-10-CM | POA: Diagnosis not present

## 2019-03-06 DIAGNOSIS — M6281 Muscle weakness (generalized): Secondary | ICD-10-CM | POA: Diagnosis not present

## 2019-03-06 DIAGNOSIS — Z9181 History of falling: Secondary | ICD-10-CM | POA: Diagnosis not present

## 2019-03-06 DIAGNOSIS — G301 Alzheimer's disease with late onset: Secondary | ICD-10-CM | POA: Diagnosis not present

## 2019-03-06 DIAGNOSIS — F0280 Dementia in other diseases classified elsewhere without behavioral disturbance: Secondary | ICD-10-CM | POA: Diagnosis not present

## 2019-03-06 DIAGNOSIS — R279 Unspecified lack of coordination: Secondary | ICD-10-CM | POA: Diagnosis not present

## 2019-03-07 DIAGNOSIS — Z9181 History of falling: Secondary | ICD-10-CM | POA: Diagnosis not present

## 2019-03-07 DIAGNOSIS — G301 Alzheimer's disease with late onset: Secondary | ICD-10-CM | POA: Diagnosis not present

## 2019-03-07 DIAGNOSIS — R279 Unspecified lack of coordination: Secondary | ICD-10-CM | POA: Diagnosis not present

## 2019-03-07 DIAGNOSIS — R262 Difficulty in walking, not elsewhere classified: Secondary | ICD-10-CM | POA: Diagnosis not present

## 2019-03-07 DIAGNOSIS — M6281 Muscle weakness (generalized): Secondary | ICD-10-CM | POA: Diagnosis not present

## 2019-03-07 DIAGNOSIS — F0280 Dementia in other diseases classified elsewhere without behavioral disturbance: Secondary | ICD-10-CM | POA: Diagnosis not present

## 2019-03-08 DIAGNOSIS — R262 Difficulty in walking, not elsewhere classified: Secondary | ICD-10-CM | POA: Diagnosis not present

## 2019-03-08 DIAGNOSIS — G301 Alzheimer's disease with late onset: Secondary | ICD-10-CM | POA: Diagnosis not present

## 2019-03-08 DIAGNOSIS — R279 Unspecified lack of coordination: Secondary | ICD-10-CM | POA: Diagnosis not present

## 2019-03-08 DIAGNOSIS — Z9181 History of falling: Secondary | ICD-10-CM | POA: Diagnosis not present

## 2019-03-08 DIAGNOSIS — F0280 Dementia in other diseases classified elsewhere without behavioral disturbance: Secondary | ICD-10-CM | POA: Diagnosis not present

## 2019-03-08 DIAGNOSIS — M6281 Muscle weakness (generalized): Secondary | ICD-10-CM | POA: Diagnosis not present

## 2019-03-09 DIAGNOSIS — M6281 Muscle weakness (generalized): Secondary | ICD-10-CM | POA: Diagnosis not present

## 2019-03-09 DIAGNOSIS — F0280 Dementia in other diseases classified elsewhere without behavioral disturbance: Secondary | ICD-10-CM | POA: Diagnosis not present

## 2019-03-09 DIAGNOSIS — Z9181 History of falling: Secondary | ICD-10-CM | POA: Diagnosis not present

## 2019-03-09 DIAGNOSIS — R262 Difficulty in walking, not elsewhere classified: Secondary | ICD-10-CM | POA: Diagnosis not present

## 2019-03-09 DIAGNOSIS — G301 Alzheimer's disease with late onset: Secondary | ICD-10-CM | POA: Diagnosis not present

## 2019-03-09 DIAGNOSIS — R279 Unspecified lack of coordination: Secondary | ICD-10-CM | POA: Diagnosis not present

## 2019-03-12 DIAGNOSIS — Z9181 History of falling: Secondary | ICD-10-CM | POA: Diagnosis not present

## 2019-03-12 DIAGNOSIS — R262 Difficulty in walking, not elsewhere classified: Secondary | ICD-10-CM | POA: Diagnosis not present

## 2019-03-12 DIAGNOSIS — M6281 Muscle weakness (generalized): Secondary | ICD-10-CM | POA: Diagnosis not present

## 2019-03-12 DIAGNOSIS — G301 Alzheimer's disease with late onset: Secondary | ICD-10-CM | POA: Diagnosis not present

## 2019-03-12 DIAGNOSIS — F0280 Dementia in other diseases classified elsewhere without behavioral disturbance: Secondary | ICD-10-CM | POA: Diagnosis not present

## 2019-03-12 DIAGNOSIS — R279 Unspecified lack of coordination: Secondary | ICD-10-CM | POA: Diagnosis not present

## 2019-03-13 DIAGNOSIS — G301 Alzheimer's disease with late onset: Secondary | ICD-10-CM | POA: Diagnosis not present

## 2019-03-13 DIAGNOSIS — Z9181 History of falling: Secondary | ICD-10-CM | POA: Diagnosis not present

## 2019-03-13 DIAGNOSIS — F0280 Dementia in other diseases classified elsewhere without behavioral disturbance: Secondary | ICD-10-CM | POA: Diagnosis not present

## 2019-03-13 DIAGNOSIS — R279 Unspecified lack of coordination: Secondary | ICD-10-CM | POA: Diagnosis not present

## 2019-03-13 DIAGNOSIS — R262 Difficulty in walking, not elsewhere classified: Secondary | ICD-10-CM | POA: Diagnosis not present

## 2019-03-13 DIAGNOSIS — M6281 Muscle weakness (generalized): Secondary | ICD-10-CM | POA: Diagnosis not present

## 2019-03-14 DIAGNOSIS — R279 Unspecified lack of coordination: Secondary | ICD-10-CM | POA: Diagnosis not present

## 2019-03-14 DIAGNOSIS — M6281 Muscle weakness (generalized): Secondary | ICD-10-CM | POA: Diagnosis not present

## 2019-03-14 DIAGNOSIS — F0280 Dementia in other diseases classified elsewhere without behavioral disturbance: Secondary | ICD-10-CM | POA: Diagnosis not present

## 2019-03-14 DIAGNOSIS — R262 Difficulty in walking, not elsewhere classified: Secondary | ICD-10-CM | POA: Diagnosis not present

## 2019-03-14 DIAGNOSIS — Z9181 History of falling: Secondary | ICD-10-CM | POA: Diagnosis not present

## 2019-03-14 DIAGNOSIS — G301 Alzheimer's disease with late onset: Secondary | ICD-10-CM | POA: Diagnosis not present

## 2019-03-15 DIAGNOSIS — M6281 Muscle weakness (generalized): Secondary | ICD-10-CM | POA: Diagnosis not present

## 2019-03-15 DIAGNOSIS — G301 Alzheimer's disease with late onset: Secondary | ICD-10-CM | POA: Diagnosis not present

## 2019-03-15 DIAGNOSIS — R262 Difficulty in walking, not elsewhere classified: Secondary | ICD-10-CM | POA: Diagnosis not present

## 2019-03-15 DIAGNOSIS — R279 Unspecified lack of coordination: Secondary | ICD-10-CM | POA: Diagnosis not present

## 2019-03-15 DIAGNOSIS — F0280 Dementia in other diseases classified elsewhere without behavioral disturbance: Secondary | ICD-10-CM | POA: Diagnosis not present

## 2019-03-15 DIAGNOSIS — Z9181 History of falling: Secondary | ICD-10-CM | POA: Diagnosis not present

## 2019-03-16 DIAGNOSIS — F0280 Dementia in other diseases classified elsewhere without behavioral disturbance: Secondary | ICD-10-CM | POA: Diagnosis not present

## 2019-03-16 DIAGNOSIS — M6281 Muscle weakness (generalized): Secondary | ICD-10-CM | POA: Diagnosis not present

## 2019-03-16 DIAGNOSIS — Z9181 History of falling: Secondary | ICD-10-CM | POA: Diagnosis not present

## 2019-03-16 DIAGNOSIS — R279 Unspecified lack of coordination: Secondary | ICD-10-CM | POA: Diagnosis not present

## 2019-03-16 DIAGNOSIS — R262 Difficulty in walking, not elsewhere classified: Secondary | ICD-10-CM | POA: Diagnosis not present

## 2019-03-16 DIAGNOSIS — G301 Alzheimer's disease with late onset: Secondary | ICD-10-CM | POA: Diagnosis not present

## 2019-03-20 DIAGNOSIS — G301 Alzheimer's disease with late onset: Secondary | ICD-10-CM | POA: Diagnosis not present

## 2019-03-20 DIAGNOSIS — M6281 Muscle weakness (generalized): Secondary | ICD-10-CM | POA: Diagnosis not present

## 2019-03-20 DIAGNOSIS — Z9181 History of falling: Secondary | ICD-10-CM | POA: Diagnosis not present

## 2019-03-20 DIAGNOSIS — R279 Unspecified lack of coordination: Secondary | ICD-10-CM | POA: Diagnosis not present

## 2019-03-20 DIAGNOSIS — R262 Difficulty in walking, not elsewhere classified: Secondary | ICD-10-CM | POA: Diagnosis not present

## 2019-03-20 DIAGNOSIS — F0280 Dementia in other diseases classified elsewhere without behavioral disturbance: Secondary | ICD-10-CM | POA: Diagnosis not present

## 2019-03-22 DIAGNOSIS — R262 Difficulty in walking, not elsewhere classified: Secondary | ICD-10-CM | POA: Diagnosis not present

## 2019-03-22 DIAGNOSIS — F0280 Dementia in other diseases classified elsewhere without behavioral disturbance: Secondary | ICD-10-CM | POA: Diagnosis not present

## 2019-03-22 DIAGNOSIS — Z9181 History of falling: Secondary | ICD-10-CM | POA: Diagnosis not present

## 2019-03-22 DIAGNOSIS — M6281 Muscle weakness (generalized): Secondary | ICD-10-CM | POA: Diagnosis not present

## 2019-03-22 DIAGNOSIS — G301 Alzheimer's disease with late onset: Secondary | ICD-10-CM | POA: Diagnosis not present

## 2019-03-22 DIAGNOSIS — R279 Unspecified lack of coordination: Secondary | ICD-10-CM | POA: Diagnosis not present

## 2019-03-27 ENCOUNTER — Encounter
Admission: RE | Admit: 2019-03-27 | Discharge: 2019-03-27 | Disposition: A | Discharge status: Home / Self Care | Payer: Medicare Other | Source: Ambulatory Visit | Attending: Internal Medicine | Admitting: Internal Medicine

## 2019-03-28 DIAGNOSIS — G301 Alzheimer's disease with late onset: Secondary | ICD-10-CM | POA: Diagnosis not present

## 2019-03-28 DIAGNOSIS — R262 Difficulty in walking, not elsewhere classified: Secondary | ICD-10-CM | POA: Diagnosis not present

## 2019-03-28 DIAGNOSIS — M419 Scoliosis, unspecified: Secondary | ICD-10-CM | POA: Diagnosis not present

## 2019-03-28 DIAGNOSIS — M6281 Muscle weakness (generalized): Secondary | ICD-10-CM | POA: Diagnosis not present

## 2019-03-28 DIAGNOSIS — Z9181 History of falling: Secondary | ICD-10-CM | POA: Diagnosis not present

## 2019-03-28 DIAGNOSIS — F0280 Dementia in other diseases classified elsewhere without behavioral disturbance: Secondary | ICD-10-CM | POA: Diagnosis not present

## 2019-03-28 DIAGNOSIS — R279 Unspecified lack of coordination: Secondary | ICD-10-CM | POA: Diagnosis not present

## 2019-03-30 DIAGNOSIS — M6281 Muscle weakness (generalized): Secondary | ICD-10-CM | POA: Diagnosis not present

## 2019-03-30 DIAGNOSIS — Z9181 History of falling: Secondary | ICD-10-CM | POA: Diagnosis not present

## 2019-03-30 DIAGNOSIS — G301 Alzheimer's disease with late onset: Secondary | ICD-10-CM | POA: Diagnosis not present

## 2019-03-30 DIAGNOSIS — F0280 Dementia in other diseases classified elsewhere without behavioral disturbance: Secondary | ICD-10-CM | POA: Diagnosis not present

## 2019-03-30 DIAGNOSIS — R279 Unspecified lack of coordination: Secondary | ICD-10-CM | POA: Diagnosis not present

## 2019-03-30 DIAGNOSIS — R262 Difficulty in walking, not elsewhere classified: Secondary | ICD-10-CM | POA: Diagnosis not present

## 2019-04-03 DIAGNOSIS — R262 Difficulty in walking, not elsewhere classified: Secondary | ICD-10-CM | POA: Diagnosis not present

## 2019-04-03 DIAGNOSIS — R279 Unspecified lack of coordination: Secondary | ICD-10-CM | POA: Diagnosis not present

## 2019-04-03 DIAGNOSIS — F0280 Dementia in other diseases classified elsewhere without behavioral disturbance: Secondary | ICD-10-CM | POA: Diagnosis not present

## 2019-04-03 DIAGNOSIS — M6281 Muscle weakness (generalized): Secondary | ICD-10-CM | POA: Diagnosis not present

## 2019-04-03 DIAGNOSIS — Z9181 History of falling: Secondary | ICD-10-CM | POA: Diagnosis not present

## 2019-04-03 DIAGNOSIS — G301 Alzheimer's disease with late onset: Secondary | ICD-10-CM | POA: Diagnosis not present

## 2019-04-05 DIAGNOSIS — R262 Difficulty in walking, not elsewhere classified: Secondary | ICD-10-CM | POA: Diagnosis not present

## 2019-04-05 DIAGNOSIS — F0280 Dementia in other diseases classified elsewhere without behavioral disturbance: Secondary | ICD-10-CM | POA: Diagnosis not present

## 2019-04-05 DIAGNOSIS — M6281 Muscle weakness (generalized): Secondary | ICD-10-CM | POA: Diagnosis not present

## 2019-04-05 DIAGNOSIS — G301 Alzheimer's disease with late onset: Secondary | ICD-10-CM | POA: Diagnosis not present

## 2019-04-05 DIAGNOSIS — R279 Unspecified lack of coordination: Secondary | ICD-10-CM | POA: Diagnosis not present

## 2019-04-05 DIAGNOSIS — Z9181 History of falling: Secondary | ICD-10-CM | POA: Diagnosis not present

## 2019-04-06 DIAGNOSIS — R262 Difficulty in walking, not elsewhere classified: Secondary | ICD-10-CM | POA: Diagnosis not present

## 2019-04-06 DIAGNOSIS — G301 Alzheimer's disease with late onset: Secondary | ICD-10-CM | POA: Diagnosis not present

## 2019-04-06 DIAGNOSIS — M6281 Muscle weakness (generalized): Secondary | ICD-10-CM | POA: Diagnosis not present

## 2019-04-06 DIAGNOSIS — F0280 Dementia in other diseases classified elsewhere without behavioral disturbance: Secondary | ICD-10-CM | POA: Diagnosis not present

## 2019-04-06 DIAGNOSIS — Z9181 History of falling: Secondary | ICD-10-CM | POA: Diagnosis not present

## 2019-04-06 DIAGNOSIS — R279 Unspecified lack of coordination: Secondary | ICD-10-CM | POA: Diagnosis not present

## 2019-04-27 ENCOUNTER — Encounter
Admission: RE | Admit: 2019-04-27 | Discharge: 2019-04-27 | Disposition: A | Discharge status: Home / Self Care | Payer: Medicare Other | Source: Ambulatory Visit | Attending: Internal Medicine | Admitting: Internal Medicine

## 2019-05-01 DIAGNOSIS — F0390 Unspecified dementia without behavioral disturbance: Secondary | ICD-10-CM | POA: Diagnosis not present

## 2019-05-01 DIAGNOSIS — F325 Major depressive disorder, single episode, in full remission: Secondary | ICD-10-CM | POA: Diagnosis not present

## 2019-05-01 DIAGNOSIS — Z593 Problems related to living in residential institution: Secondary | ICD-10-CM | POA: Diagnosis not present

## 2019-05-01 DIAGNOSIS — I10 Essential (primary) hypertension: Secondary | ICD-10-CM | POA: Diagnosis not present

## 2019-05-18 DIAGNOSIS — M6281 Muscle weakness (generalized): Secondary | ICD-10-CM | POA: Diagnosis not present

## 2019-05-18 DIAGNOSIS — M419 Scoliosis, unspecified: Secondary | ICD-10-CM | POA: Diagnosis not present

## 2019-05-18 DIAGNOSIS — F0280 Dementia in other diseases classified elsewhere without behavioral disturbance: Secondary | ICD-10-CM | POA: Diagnosis not present

## 2019-05-18 DIAGNOSIS — R262 Difficulty in walking, not elsewhere classified: Secondary | ICD-10-CM | POA: Diagnosis not present

## 2019-05-18 DIAGNOSIS — Z9181 History of falling: Secondary | ICD-10-CM | POA: Diagnosis not present

## 2019-05-18 DIAGNOSIS — R279 Unspecified lack of coordination: Secondary | ICD-10-CM | POA: Diagnosis not present

## 2019-05-18 DIAGNOSIS — G301 Alzheimer's disease with late onset: Secondary | ICD-10-CM | POA: Diagnosis not present

## 2019-05-21 DIAGNOSIS — F0280 Dementia in other diseases classified elsewhere without behavioral disturbance: Secondary | ICD-10-CM | POA: Diagnosis not present

## 2019-05-21 DIAGNOSIS — R279 Unspecified lack of coordination: Secondary | ICD-10-CM | POA: Diagnosis not present

## 2019-05-21 DIAGNOSIS — Z9181 History of falling: Secondary | ICD-10-CM | POA: Diagnosis not present

## 2019-05-21 DIAGNOSIS — R262 Difficulty in walking, not elsewhere classified: Secondary | ICD-10-CM | POA: Diagnosis not present

## 2019-05-21 DIAGNOSIS — M6281 Muscle weakness (generalized): Secondary | ICD-10-CM | POA: Diagnosis not present

## 2019-05-21 DIAGNOSIS — G301 Alzheimer's disease with late onset: Secondary | ICD-10-CM | POA: Diagnosis not present

## 2019-05-23 DIAGNOSIS — M5489 Other dorsalgia: Secondary | ICD-10-CM | POA: Diagnosis not present

## 2019-05-23 DIAGNOSIS — R262 Difficulty in walking, not elsewhere classified: Secondary | ICD-10-CM | POA: Diagnosis not present

## 2019-05-23 DIAGNOSIS — F0280 Dementia in other diseases classified elsewhere without behavioral disturbance: Secondary | ICD-10-CM | POA: Diagnosis not present

## 2019-05-23 DIAGNOSIS — R279 Unspecified lack of coordination: Secondary | ICD-10-CM | POA: Diagnosis not present

## 2019-05-23 DIAGNOSIS — F0390 Unspecified dementia without behavioral disturbance: Secondary | ICD-10-CM | POA: Diagnosis not present

## 2019-05-23 DIAGNOSIS — M6281 Muscle weakness (generalized): Secondary | ICD-10-CM | POA: Diagnosis not present

## 2019-05-23 DIAGNOSIS — G301 Alzheimer's disease with late onset: Secondary | ICD-10-CM | POA: Diagnosis not present

## 2019-05-23 DIAGNOSIS — Z9181 History of falling: Secondary | ICD-10-CM | POA: Diagnosis not present

## 2019-05-24 DIAGNOSIS — M545 Low back pain: Secondary | ICD-10-CM | POA: Diagnosis not present

## 2019-06-04 ENCOUNTER — Encounter
Admission: RE | Admit: 2019-06-04 | Discharge: 2019-06-04 | Disposition: A | Discharge status: Home / Self Care | Payer: Medicare Other | Source: Ambulatory Visit | Attending: Internal Medicine | Admitting: Internal Medicine

## 2019-06-12 DIAGNOSIS — F0390 Unspecified dementia without behavioral disturbance: Secondary | ICD-10-CM | POA: Diagnosis not present

## 2019-06-13 DIAGNOSIS — F325 Major depressive disorder, single episode, in full remission: Secondary | ICD-10-CM | POA: Diagnosis not present

## 2019-06-13 DIAGNOSIS — F0390 Unspecified dementia without behavioral disturbance: Secondary | ICD-10-CM | POA: Diagnosis not present

## 2019-06-13 DIAGNOSIS — M5489 Other dorsalgia: Secondary | ICD-10-CM | POA: Diagnosis not present

## 2019-06-13 DIAGNOSIS — I10 Essential (primary) hypertension: Secondary | ICD-10-CM | POA: Diagnosis not present

## 2019-06-14 DIAGNOSIS — F0390 Unspecified dementia without behavioral disturbance: Secondary | ICD-10-CM | POA: Diagnosis not present

## 2019-06-15 DIAGNOSIS — F0390 Unspecified dementia without behavioral disturbance: Secondary | ICD-10-CM | POA: Diagnosis not present

## 2019-06-18 DIAGNOSIS — F0390 Unspecified dementia without behavioral disturbance: Secondary | ICD-10-CM | POA: Diagnosis not present

## 2019-06-19 DIAGNOSIS — F0390 Unspecified dementia without behavioral disturbance: Secondary | ICD-10-CM | POA: Diagnosis not present

## 2019-06-20 DIAGNOSIS — F325 Major depressive disorder, single episode, in full remission: Secondary | ICD-10-CM | POA: Diagnosis not present

## 2019-06-20 DIAGNOSIS — Z789 Other specified health status: Secondary | ICD-10-CM | POA: Diagnosis not present

## 2019-06-20 DIAGNOSIS — F0390 Unspecified dementia without behavioral disturbance: Secondary | ICD-10-CM | POA: Diagnosis not present

## 2019-06-25 DIAGNOSIS — F0390 Unspecified dementia without behavioral disturbance: Secondary | ICD-10-CM | POA: Diagnosis not present

## 2019-06-27 DIAGNOSIS — M419 Scoliosis, unspecified: Secondary | ICD-10-CM | POA: Diagnosis not present

## 2019-06-27 DIAGNOSIS — G301 Alzheimer's disease with late onset: Secondary | ICD-10-CM | POA: Diagnosis not present

## 2019-06-27 DIAGNOSIS — R262 Difficulty in walking, not elsewhere classified: Secondary | ICD-10-CM | POA: Diagnosis not present

## 2019-06-27 DIAGNOSIS — F0390 Unspecified dementia without behavioral disturbance: Secondary | ICD-10-CM | POA: Diagnosis not present

## 2019-06-27 DIAGNOSIS — M6281 Muscle weakness (generalized): Secondary | ICD-10-CM | POA: Diagnosis not present

## 2019-06-27 DIAGNOSIS — Z9181 History of falling: Secondary | ICD-10-CM | POA: Diagnosis not present

## 2019-06-27 DIAGNOSIS — R279 Unspecified lack of coordination: Secondary | ICD-10-CM | POA: Diagnosis not present

## 2019-06-29 DIAGNOSIS — R262 Difficulty in walking, not elsewhere classified: Secondary | ICD-10-CM | POA: Diagnosis not present

## 2019-06-29 DIAGNOSIS — F0390 Unspecified dementia without behavioral disturbance: Secondary | ICD-10-CM | POA: Diagnosis not present

## 2019-06-29 DIAGNOSIS — Z9181 History of falling: Secondary | ICD-10-CM | POA: Diagnosis not present

## 2019-06-29 DIAGNOSIS — G301 Alzheimer's disease with late onset: Secondary | ICD-10-CM | POA: Diagnosis not present

## 2019-06-29 DIAGNOSIS — R279 Unspecified lack of coordination: Secondary | ICD-10-CM | POA: Diagnosis not present

## 2019-06-29 DIAGNOSIS — M6281 Muscle weakness (generalized): Secondary | ICD-10-CM | POA: Diagnosis not present

## 2019-07-02 DIAGNOSIS — R279 Unspecified lack of coordination: Secondary | ICD-10-CM | POA: Diagnosis not present

## 2019-07-02 DIAGNOSIS — F0390 Unspecified dementia without behavioral disturbance: Secondary | ICD-10-CM | POA: Diagnosis not present

## 2019-07-02 DIAGNOSIS — Z9181 History of falling: Secondary | ICD-10-CM | POA: Diagnosis not present

## 2019-07-02 DIAGNOSIS — R262 Difficulty in walking, not elsewhere classified: Secondary | ICD-10-CM | POA: Diagnosis not present

## 2019-07-02 DIAGNOSIS — G301 Alzheimer's disease with late onset: Secondary | ICD-10-CM | POA: Diagnosis not present

## 2019-07-02 DIAGNOSIS — M6281 Muscle weakness (generalized): Secondary | ICD-10-CM | POA: Diagnosis not present

## 2019-07-04 DIAGNOSIS — R279 Unspecified lack of coordination: Secondary | ICD-10-CM | POA: Diagnosis not present

## 2019-07-04 DIAGNOSIS — R262 Difficulty in walking, not elsewhere classified: Secondary | ICD-10-CM | POA: Diagnosis not present

## 2019-07-04 DIAGNOSIS — M6281 Muscle weakness (generalized): Secondary | ICD-10-CM | POA: Diagnosis not present

## 2019-07-04 DIAGNOSIS — G301 Alzheimer's disease with late onset: Secondary | ICD-10-CM | POA: Diagnosis not present

## 2019-07-04 DIAGNOSIS — Z9181 History of falling: Secondary | ICD-10-CM | POA: Diagnosis not present

## 2019-07-04 DIAGNOSIS — F0390 Unspecified dementia without behavioral disturbance: Secondary | ICD-10-CM | POA: Diagnosis not present

## 2019-07-05 DIAGNOSIS — F0390 Unspecified dementia without behavioral disturbance: Secondary | ICD-10-CM | POA: Diagnosis not present

## 2019-07-05 DIAGNOSIS — R279 Unspecified lack of coordination: Secondary | ICD-10-CM | POA: Diagnosis not present

## 2019-07-05 DIAGNOSIS — Z9181 History of falling: Secondary | ICD-10-CM | POA: Diagnosis not present

## 2019-07-05 DIAGNOSIS — R262 Difficulty in walking, not elsewhere classified: Secondary | ICD-10-CM | POA: Diagnosis not present

## 2019-07-05 DIAGNOSIS — M6281 Muscle weakness (generalized): Secondary | ICD-10-CM | POA: Diagnosis not present

## 2019-07-05 DIAGNOSIS — G301 Alzheimer's disease with late onset: Secondary | ICD-10-CM | POA: Diagnosis not present

## 2019-07-06 DIAGNOSIS — R262 Difficulty in walking, not elsewhere classified: Secondary | ICD-10-CM | POA: Diagnosis not present

## 2019-07-06 DIAGNOSIS — Z9181 History of falling: Secondary | ICD-10-CM | POA: Diagnosis not present

## 2019-07-06 DIAGNOSIS — F0390 Unspecified dementia without behavioral disturbance: Secondary | ICD-10-CM | POA: Diagnosis not present

## 2019-07-06 DIAGNOSIS — M6281 Muscle weakness (generalized): Secondary | ICD-10-CM | POA: Diagnosis not present

## 2019-07-06 DIAGNOSIS — R279 Unspecified lack of coordination: Secondary | ICD-10-CM | POA: Diagnosis not present

## 2019-07-06 DIAGNOSIS — G301 Alzheimer's disease with late onset: Secondary | ICD-10-CM | POA: Diagnosis not present

## 2019-07-09 DIAGNOSIS — R279 Unspecified lack of coordination: Secondary | ICD-10-CM | POA: Diagnosis not present

## 2019-07-09 DIAGNOSIS — Z9181 History of falling: Secondary | ICD-10-CM | POA: Diagnosis not present

## 2019-07-09 DIAGNOSIS — R262 Difficulty in walking, not elsewhere classified: Secondary | ICD-10-CM | POA: Diagnosis not present

## 2019-07-09 DIAGNOSIS — F0390 Unspecified dementia without behavioral disturbance: Secondary | ICD-10-CM | POA: Diagnosis not present

## 2019-07-09 DIAGNOSIS — M6281 Muscle weakness (generalized): Secondary | ICD-10-CM | POA: Diagnosis not present

## 2019-07-09 DIAGNOSIS — G301 Alzheimer's disease with late onset: Secondary | ICD-10-CM | POA: Diagnosis not present

## 2019-07-11 DIAGNOSIS — Z20828 Contact with and (suspected) exposure to other viral communicable diseases: Secondary | ICD-10-CM | POA: Diagnosis not present

## 2019-07-16 DIAGNOSIS — Z9181 History of falling: Secondary | ICD-10-CM | POA: Diagnosis not present

## 2019-07-16 DIAGNOSIS — M6281 Muscle weakness (generalized): Secondary | ICD-10-CM | POA: Diagnosis not present

## 2019-07-16 DIAGNOSIS — F0390 Unspecified dementia without behavioral disturbance: Secondary | ICD-10-CM | POA: Diagnosis not present

## 2019-07-16 DIAGNOSIS — G301 Alzheimer's disease with late onset: Secondary | ICD-10-CM | POA: Diagnosis not present

## 2019-07-16 DIAGNOSIS — R279 Unspecified lack of coordination: Secondary | ICD-10-CM | POA: Diagnosis not present

## 2019-07-16 DIAGNOSIS — R262 Difficulty in walking, not elsewhere classified: Secondary | ICD-10-CM | POA: Diagnosis not present

## 2019-07-16 DIAGNOSIS — Z20828 Contact with and (suspected) exposure to other viral communicable diseases: Secondary | ICD-10-CM | POA: Diagnosis not present

## 2019-07-17 DIAGNOSIS — M6281 Muscle weakness (generalized): Secondary | ICD-10-CM | POA: Diagnosis not present

## 2019-07-17 DIAGNOSIS — F0390 Unspecified dementia without behavioral disturbance: Secondary | ICD-10-CM | POA: Diagnosis not present

## 2019-07-17 DIAGNOSIS — R262 Difficulty in walking, not elsewhere classified: Secondary | ICD-10-CM | POA: Diagnosis not present

## 2019-07-17 DIAGNOSIS — G301 Alzheimer's disease with late onset: Secondary | ICD-10-CM | POA: Diagnosis not present

## 2019-07-17 DIAGNOSIS — Z9181 History of falling: Secondary | ICD-10-CM | POA: Diagnosis not present

## 2019-07-17 DIAGNOSIS — R279 Unspecified lack of coordination: Secondary | ICD-10-CM | POA: Diagnosis not present

## 2019-07-18 DIAGNOSIS — R279 Unspecified lack of coordination: Secondary | ICD-10-CM | POA: Diagnosis not present

## 2019-07-18 DIAGNOSIS — G301 Alzheimer's disease with late onset: Secondary | ICD-10-CM | POA: Diagnosis not present

## 2019-07-18 DIAGNOSIS — R262 Difficulty in walking, not elsewhere classified: Secondary | ICD-10-CM | POA: Diagnosis not present

## 2019-07-18 DIAGNOSIS — F0390 Unspecified dementia without behavioral disturbance: Secondary | ICD-10-CM | POA: Diagnosis not present

## 2019-07-18 DIAGNOSIS — M6281 Muscle weakness (generalized): Secondary | ICD-10-CM | POA: Diagnosis not present

## 2019-07-18 DIAGNOSIS — Z9181 History of falling: Secondary | ICD-10-CM | POA: Diagnosis not present

## 2019-07-23 DIAGNOSIS — Z20828 Contact with and (suspected) exposure to other viral communicable diseases: Secondary | ICD-10-CM | POA: Diagnosis not present

## 2019-07-23 DIAGNOSIS — F0390 Unspecified dementia without behavioral disturbance: Secondary | ICD-10-CM | POA: Diagnosis not present

## 2019-07-23 DIAGNOSIS — R279 Unspecified lack of coordination: Secondary | ICD-10-CM | POA: Diagnosis not present

## 2019-07-23 DIAGNOSIS — M6281 Muscle weakness (generalized): Secondary | ICD-10-CM | POA: Diagnosis not present

## 2019-07-23 DIAGNOSIS — G301 Alzheimer's disease with late onset: Secondary | ICD-10-CM | POA: Diagnosis not present

## 2019-07-23 DIAGNOSIS — R262 Difficulty in walking, not elsewhere classified: Secondary | ICD-10-CM | POA: Diagnosis not present

## 2019-07-23 DIAGNOSIS — Z9181 History of falling: Secondary | ICD-10-CM | POA: Diagnosis not present

## 2019-07-25 DIAGNOSIS — F0390 Unspecified dementia without behavioral disturbance: Secondary | ICD-10-CM | POA: Diagnosis not present

## 2019-07-25 DIAGNOSIS — R262 Difficulty in walking, not elsewhere classified: Secondary | ICD-10-CM | POA: Diagnosis not present

## 2019-07-25 DIAGNOSIS — G301 Alzheimer's disease with late onset: Secondary | ICD-10-CM | POA: Diagnosis not present

## 2019-07-25 DIAGNOSIS — R279 Unspecified lack of coordination: Secondary | ICD-10-CM | POA: Diagnosis not present

## 2019-07-25 DIAGNOSIS — Z9181 History of falling: Secondary | ICD-10-CM | POA: Diagnosis not present

## 2019-07-25 DIAGNOSIS — M6281 Muscle weakness (generalized): Secondary | ICD-10-CM | POA: Diagnosis not present

## 2019-07-26 DIAGNOSIS — G301 Alzheimer's disease with late onset: Secondary | ICD-10-CM | POA: Diagnosis not present

## 2019-07-26 DIAGNOSIS — Z9181 History of falling: Secondary | ICD-10-CM | POA: Diagnosis not present

## 2019-07-26 DIAGNOSIS — R279 Unspecified lack of coordination: Secondary | ICD-10-CM | POA: Diagnosis not present

## 2019-07-26 DIAGNOSIS — M6281 Muscle weakness (generalized): Secondary | ICD-10-CM | POA: Diagnosis not present

## 2019-07-26 DIAGNOSIS — F0390 Unspecified dementia without behavioral disturbance: Secondary | ICD-10-CM | POA: Diagnosis not present

## 2019-07-26 DIAGNOSIS — R262 Difficulty in walking, not elsewhere classified: Secondary | ICD-10-CM | POA: Diagnosis not present

## 2019-07-27 DIAGNOSIS — G301 Alzheimer's disease with late onset: Secondary | ICD-10-CM | POA: Diagnosis not present

## 2019-07-27 DIAGNOSIS — M6281 Muscle weakness (generalized): Secondary | ICD-10-CM | POA: Diagnosis not present

## 2019-07-27 DIAGNOSIS — Z9181 History of falling: Secondary | ICD-10-CM | POA: Diagnosis not present

## 2019-07-27 DIAGNOSIS — R262 Difficulty in walking, not elsewhere classified: Secondary | ICD-10-CM | POA: Diagnosis not present

## 2019-07-27 DIAGNOSIS — R2689 Other abnormalities of gait and mobility: Secondary | ICD-10-CM | POA: Diagnosis not present

## 2019-07-31 DIAGNOSIS — R2689 Other abnormalities of gait and mobility: Secondary | ICD-10-CM | POA: Diagnosis not present

## 2019-07-31 DIAGNOSIS — Z9181 History of falling: Secondary | ICD-10-CM | POA: Diagnosis not present

## 2019-07-31 DIAGNOSIS — G301 Alzheimer's disease with late onset: Secondary | ICD-10-CM | POA: Diagnosis not present

## 2019-07-31 DIAGNOSIS — R262 Difficulty in walking, not elsewhere classified: Secondary | ICD-10-CM | POA: Diagnosis not present

## 2019-07-31 DIAGNOSIS — M6281 Muscle weakness (generalized): Secondary | ICD-10-CM | POA: Diagnosis not present

## 2019-08-01 DIAGNOSIS — G301 Alzheimer's disease with late onset: Secondary | ICD-10-CM | POA: Diagnosis not present

## 2019-08-01 DIAGNOSIS — R2689 Other abnormalities of gait and mobility: Secondary | ICD-10-CM | POA: Diagnosis not present

## 2019-08-01 DIAGNOSIS — M6281 Muscle weakness (generalized): Secondary | ICD-10-CM | POA: Diagnosis not present

## 2019-08-01 DIAGNOSIS — R262 Difficulty in walking, not elsewhere classified: Secondary | ICD-10-CM | POA: Diagnosis not present

## 2019-08-01 DIAGNOSIS — Z9181 History of falling: Secondary | ICD-10-CM | POA: Diagnosis not present

## 2019-08-02 DIAGNOSIS — R600 Localized edema: Secondary | ICD-10-CM | POA: Diagnosis not present

## 2019-08-02 DIAGNOSIS — F0390 Unspecified dementia without behavioral disturbance: Secondary | ICD-10-CM | POA: Diagnosis not present

## 2019-08-02 DIAGNOSIS — Z789 Other specified health status: Secondary | ICD-10-CM | POA: Diagnosis not present

## 2019-08-02 DIAGNOSIS — G301 Alzheimer's disease with late onset: Secondary | ICD-10-CM | POA: Diagnosis not present

## 2019-08-02 DIAGNOSIS — R262 Difficulty in walking, not elsewhere classified: Secondary | ICD-10-CM | POA: Diagnosis not present

## 2019-08-02 DIAGNOSIS — Z9181 History of falling: Secondary | ICD-10-CM | POA: Diagnosis not present

## 2019-08-02 DIAGNOSIS — I10 Essential (primary) hypertension: Secondary | ICD-10-CM | POA: Diagnosis not present

## 2019-08-02 DIAGNOSIS — R2689 Other abnormalities of gait and mobility: Secondary | ICD-10-CM | POA: Diagnosis not present

## 2019-08-02 DIAGNOSIS — M6281 Muscle weakness (generalized): Secondary | ICD-10-CM | POA: Diagnosis not present

## 2019-08-06 DIAGNOSIS — R262 Difficulty in walking, not elsewhere classified: Secondary | ICD-10-CM | POA: Diagnosis not present

## 2019-08-06 DIAGNOSIS — R2689 Other abnormalities of gait and mobility: Secondary | ICD-10-CM | POA: Diagnosis not present

## 2019-08-06 DIAGNOSIS — Z9181 History of falling: Secondary | ICD-10-CM | POA: Diagnosis not present

## 2019-08-06 DIAGNOSIS — M6281 Muscle weakness (generalized): Secondary | ICD-10-CM | POA: Diagnosis not present

## 2019-08-06 DIAGNOSIS — G301 Alzheimer's disease with late onset: Secondary | ICD-10-CM | POA: Diagnosis not present

## 2019-08-08 DIAGNOSIS — Z9181 History of falling: Secondary | ICD-10-CM | POA: Diagnosis not present

## 2019-08-08 DIAGNOSIS — R262 Difficulty in walking, not elsewhere classified: Secondary | ICD-10-CM | POA: Diagnosis not present

## 2019-08-08 DIAGNOSIS — G301 Alzheimer's disease with late onset: Secondary | ICD-10-CM | POA: Diagnosis not present

## 2019-08-08 DIAGNOSIS — R2689 Other abnormalities of gait and mobility: Secondary | ICD-10-CM | POA: Diagnosis not present

## 2019-08-08 DIAGNOSIS — M6281 Muscle weakness (generalized): Secondary | ICD-10-CM | POA: Diagnosis not present

## 2019-08-08 DIAGNOSIS — Z20828 Contact with and (suspected) exposure to other viral communicable diseases: Secondary | ICD-10-CM | POA: Diagnosis not present

## 2019-08-10 DIAGNOSIS — R2689 Other abnormalities of gait and mobility: Secondary | ICD-10-CM | POA: Diagnosis not present

## 2019-08-10 DIAGNOSIS — M6281 Muscle weakness (generalized): Secondary | ICD-10-CM | POA: Diagnosis not present

## 2019-08-10 DIAGNOSIS — Z9181 History of falling: Secondary | ICD-10-CM | POA: Diagnosis not present

## 2019-08-10 DIAGNOSIS — G301 Alzheimer's disease with late onset: Secondary | ICD-10-CM | POA: Diagnosis not present

## 2019-08-10 DIAGNOSIS — R262 Difficulty in walking, not elsewhere classified: Secondary | ICD-10-CM | POA: Diagnosis not present

## 2019-08-13 DIAGNOSIS — M6281 Muscle weakness (generalized): Secondary | ICD-10-CM | POA: Diagnosis not present

## 2019-08-13 DIAGNOSIS — G301 Alzheimer's disease with late onset: Secondary | ICD-10-CM | POA: Diagnosis not present

## 2019-08-13 DIAGNOSIS — R2689 Other abnormalities of gait and mobility: Secondary | ICD-10-CM | POA: Diagnosis not present

## 2019-08-13 DIAGNOSIS — Z20828 Contact with and (suspected) exposure to other viral communicable diseases: Secondary | ICD-10-CM | POA: Diagnosis not present

## 2019-08-13 DIAGNOSIS — Z9181 History of falling: Secondary | ICD-10-CM | POA: Diagnosis not present

## 2019-08-13 DIAGNOSIS — R262 Difficulty in walking, not elsewhere classified: Secondary | ICD-10-CM | POA: Diagnosis not present

## 2019-08-14 DIAGNOSIS — R262 Difficulty in walking, not elsewhere classified: Secondary | ICD-10-CM | POA: Diagnosis not present

## 2019-08-14 DIAGNOSIS — M6281 Muscle weakness (generalized): Secondary | ICD-10-CM | POA: Diagnosis not present

## 2019-08-14 DIAGNOSIS — Z9181 History of falling: Secondary | ICD-10-CM | POA: Diagnosis not present

## 2019-08-14 DIAGNOSIS — R2689 Other abnormalities of gait and mobility: Secondary | ICD-10-CM | POA: Diagnosis not present

## 2019-08-14 DIAGNOSIS — G301 Alzheimer's disease with late onset: Secondary | ICD-10-CM | POA: Diagnosis not present

## 2019-08-15 DIAGNOSIS — G301 Alzheimer's disease with late onset: Secondary | ICD-10-CM | POA: Diagnosis not present

## 2019-08-15 DIAGNOSIS — Z9181 History of falling: Secondary | ICD-10-CM | POA: Diagnosis not present

## 2019-08-15 DIAGNOSIS — R2689 Other abnormalities of gait and mobility: Secondary | ICD-10-CM | POA: Diagnosis not present

## 2019-08-15 DIAGNOSIS — M6281 Muscle weakness (generalized): Secondary | ICD-10-CM | POA: Diagnosis not present

## 2019-08-15 DIAGNOSIS — R262 Difficulty in walking, not elsewhere classified: Secondary | ICD-10-CM | POA: Diagnosis not present

## 2019-08-16 DIAGNOSIS — Z9181 History of falling: Secondary | ICD-10-CM | POA: Diagnosis not present

## 2019-08-16 DIAGNOSIS — M6281 Muscle weakness (generalized): Secondary | ICD-10-CM | POA: Diagnosis not present

## 2019-08-16 DIAGNOSIS — R2689 Other abnormalities of gait and mobility: Secondary | ICD-10-CM | POA: Diagnosis not present

## 2019-08-16 DIAGNOSIS — R262 Difficulty in walking, not elsewhere classified: Secondary | ICD-10-CM | POA: Diagnosis not present

## 2019-08-16 DIAGNOSIS — G301 Alzheimer's disease with late onset: Secondary | ICD-10-CM | POA: Diagnosis not present

## 2019-08-17 ENCOUNTER — Emergency Department: Payer: Medicare Other

## 2019-08-17 ENCOUNTER — Emergency Department
Admission: EM | Admit: 2019-08-17 | Discharge: 2019-08-17 | Disposition: A | Discharge status: Skilled Nursing Facility | Payer: Medicare Other | Attending: Emergency Medicine | Admitting: Emergency Medicine

## 2019-08-17 ENCOUNTER — Other Ambulatory Visit: Payer: Self-pay

## 2019-08-17 DIAGNOSIS — N3001 Acute cystitis with hematuria: Secondary | ICD-10-CM | POA: Diagnosis not present

## 2019-08-17 DIAGNOSIS — S022XXA Fracture of nasal bones, initial encounter for closed fracture: Secondary | ICD-10-CM | POA: Diagnosis not present

## 2019-08-17 DIAGNOSIS — Z9181 History of falling: Secondary | ICD-10-CM | POA: Diagnosis not present

## 2019-08-17 DIAGNOSIS — Y9389 Activity, other specified: Secondary | ICD-10-CM | POA: Insufficient documentation

## 2019-08-17 DIAGNOSIS — R404 Transient alteration of awareness: Secondary | ICD-10-CM | POA: Diagnosis not present

## 2019-08-17 DIAGNOSIS — Z7982 Long term (current) use of aspirin: Secondary | ICD-10-CM | POA: Diagnosis not present

## 2019-08-17 DIAGNOSIS — G301 Alzheimer's disease with late onset: Secondary | ICD-10-CM | POA: Diagnosis not present

## 2019-08-17 DIAGNOSIS — I10 Essential (primary) hypertension: Secondary | ICD-10-CM | POA: Diagnosis not present

## 2019-08-17 DIAGNOSIS — R58 Hemorrhage, not elsewhere classified: Secondary | ICD-10-CM | POA: Diagnosis not present

## 2019-08-17 DIAGNOSIS — Z23 Encounter for immunization: Secondary | ICD-10-CM | POA: Diagnosis not present

## 2019-08-17 DIAGNOSIS — Z79899 Other long term (current) drug therapy: Secondary | ICD-10-CM | POA: Insufficient documentation

## 2019-08-17 DIAGNOSIS — F0280 Dementia in other diseases classified elsewhere without behavioral disturbance: Secondary | ICD-10-CM | POA: Insufficient documentation

## 2019-08-17 DIAGNOSIS — S199XXA Unspecified injury of neck, initial encounter: Secondary | ICD-10-CM | POA: Diagnosis not present

## 2019-08-17 DIAGNOSIS — R410 Disorientation, unspecified: Secondary | ICD-10-CM | POA: Diagnosis not present

## 2019-08-17 DIAGNOSIS — R2689 Other abnormalities of gait and mobility: Secondary | ICD-10-CM | POA: Diagnosis not present

## 2019-08-17 DIAGNOSIS — S0990XA Unspecified injury of head, initial encounter: Secondary | ICD-10-CM | POA: Diagnosis present

## 2019-08-17 DIAGNOSIS — Y92128 Other place in nursing home as the place of occurrence of the external cause: Secondary | ICD-10-CM | POA: Insufficient documentation

## 2019-08-17 DIAGNOSIS — S0181XA Laceration without foreign body of other part of head, initial encounter: Secondary | ICD-10-CM | POA: Diagnosis not present

## 2019-08-17 DIAGNOSIS — I959 Hypotension, unspecified: Secondary | ICD-10-CM | POA: Diagnosis not present

## 2019-08-17 DIAGNOSIS — W19XXXA Unspecified fall, initial encounter: Secondary | ICD-10-CM | POA: Insufficient documentation

## 2019-08-17 DIAGNOSIS — R0902 Hypoxemia: Secondary | ICD-10-CM | POA: Diagnosis not present

## 2019-08-17 DIAGNOSIS — S0003XA Contusion of scalp, initial encounter: Secondary | ICD-10-CM | POA: Diagnosis not present

## 2019-08-17 DIAGNOSIS — M6281 Muscle weakness (generalized): Secondary | ICD-10-CM | POA: Diagnosis not present

## 2019-08-17 DIAGNOSIS — Y999 Unspecified external cause status: Secondary | ICD-10-CM | POA: Insufficient documentation

## 2019-08-17 DIAGNOSIS — R262 Difficulty in walking, not elsewhere classified: Secondary | ICD-10-CM | POA: Diagnosis not present

## 2019-08-17 LAB — CBC WITH DIFFERENTIAL/PLATELET
Abs Immature Granulocytes: 0.05 10*3/uL (ref 0.00–0.07)
Basophils Absolute: 0 10*3/uL (ref 0.0–0.1)
Basophils Relative: 0 %
Eosinophils Absolute: 0 10*3/uL (ref 0.0–0.5)
Eosinophils Relative: 0 %
HCT: 44.5 % (ref 36.0–46.0)
Hemoglobin: 14.8 g/dL (ref 12.0–15.0)
Immature Granulocytes: 1 %
Lymphocytes Relative: 18 %
Lymphs Abs: 1.7 10*3/uL (ref 0.7–4.0)
MCH: 34.9 pg — ABNORMAL HIGH (ref 26.0–34.0)
MCHC: 33.3 g/dL (ref 30.0–36.0)
MCV: 105 fL — ABNORMAL HIGH (ref 80.0–100.0)
Monocytes Absolute: 0.7 10*3/uL (ref 0.1–1.0)
Monocytes Relative: 8 %
Neutro Abs: 7.1 10*3/uL (ref 1.7–7.7)
Neutrophils Relative %: 73 %
Platelets: 265 10*3/uL (ref 150–400)
RBC: 4.24 MIL/uL (ref 3.87–5.11)
RDW: 13.2 % (ref 11.5–15.5)
WBC: 9.6 10*3/uL (ref 4.0–10.5)
nRBC: 0 % (ref 0.0–0.2)

## 2019-08-17 LAB — COMPREHENSIVE METABOLIC PANEL
ALT: 12 U/L (ref 0–44)
AST: 18 U/L (ref 15–41)
Albumin: 3.4 g/dL — ABNORMAL LOW (ref 3.5–5.0)
Alkaline Phosphatase: 37 U/L — ABNORMAL LOW (ref 38–126)
Anion gap: 10 (ref 5–15)
BUN: 40 mg/dL — ABNORMAL HIGH (ref 8–23)
CO2: 32 mmol/L (ref 22–32)
Calcium: 9 mg/dL (ref 8.9–10.3)
Chloride: 97 mmol/L — ABNORMAL LOW (ref 98–111)
Creatinine, Ser: 0.94 mg/dL (ref 0.44–1.00)
GFR calc Af Amer: >60 mL/min (ref >60)
GFR calc non Af Amer: 52 mL/min — ABNORMAL LOW (ref >60)
Glucose, Bld: 103 mg/dL — ABNORMAL HIGH (ref 70–99)
Potassium: 3.7 mmol/L (ref 3.5–5.1)
Sodium: 139 mmol/L (ref 135–145)
Total Bilirubin: 0.7 mg/dL (ref 0.3–1.2)
Total Protein: 7.3 g/dL (ref 6.5–8.1)

## 2019-08-17 LAB — URINALYSIS, COMPLETE (UACMP) WITH MICROSCOPIC
Bilirubin Urine: NEGATIVE
Glucose, UA: NEGATIVE mg/dL
Hgb urine dipstick: NEGATIVE
Ketones, ur: NEGATIVE mg/dL
Nitrite: POSITIVE — AB
Protein, ur: NEGATIVE mg/dL
Specific Gravity, Urine: 1.027 (ref 1.005–1.030)
pH: 5 (ref 5.0–8.0)

## 2019-08-17 MED ORDER — LIDOCAINE-PRILOCAINE 2.5-2.5 % EX CREA
TOPICAL_CREAM | CUTANEOUS | Status: AC
  Filled 2019-08-17: qty 5

## 2019-08-17 MED ORDER — TETANUS-DIPHTH-ACELL PERTUSSIS 5-2.5-18.5 LF-MCG/0.5 IM SUSP
0.5000 mL | Freq: Once | INTRAMUSCULAR | Status: AC
  Administered 2019-08-17: 0.5 mL via INTRAMUSCULAR
  Filled 2019-08-17: qty 0.5

## 2019-08-17 MED ORDER — CEPHALEXIN 500 MG PO CAPS
500.0000 mg | ORAL_CAPSULE | Freq: Once | ORAL | Status: AC
  Administered 2019-08-17: 500 mg via ORAL
  Filled 2019-08-17: qty 1

## 2019-08-17 MED ORDER — LIDOCAINE-PRILOCAINE 2.5-2.5 % EX CREA: TOPICAL_CREAM | Freq: Once | CUTANEOUS | Status: AC

## 2019-08-17 MED ORDER — ACETAMINOPHEN 500 MG PO TABS: 1000.0000 mg | ORAL_TABLET | Freq: Once | ORAL | Status: DC

## 2019-08-17 MED ORDER — SODIUM CHLORIDE 0.9 % IV SOLN: 1.0000 g | Freq: Once | INTRAVENOUS | Status: DC

## 2019-08-17 MED ORDER — CEPHALEXIN 500 MG PO CAPS: 500.0000 mg | ORAL_CAPSULE | Freq: Four times a day (QID) | ORAL | 0 refills | Status: AC

## 2019-08-17 MED ORDER — ACETAMINOPHEN 650 MG RE SUPP
650.0000 mg | Freq: Once | RECTAL | Status: AC
  Administered 2019-08-17: 650 mg via RECTAL
  Filled 2019-08-17: qty 1

## 2019-08-17 NOTE — ED Notes (Signed)
Pt transported to CT ?

## 2019-08-17 NOTE — ED Notes (Signed)
Dressing adjusted.  Waiting on transport back to nursing facility.  NAD. Family remains at bedside.

## 2019-08-17 NOTE — ED Notes (Signed)
Pt's skin tear has been controlled and without active bleeding.

## 2019-08-17 NOTE — ED Notes (Signed)
Manual pressure applied to pt's skin tear on forehead. Bleeding is now controlled with new gauze and bandage in place.

## 2019-08-17 NOTE — ED Notes (Signed)
EMS arrived to transport patient back to [facility].  Pt's head starting to bleed again uncontrolled.  New gauze, surgicil gauze and pressure applied by this RN.  MD notified.

## 2019-08-17 NOTE — Discharge Instructions (Signed)
Keep laceration dry and clean. Wash with warm water and soap. Apply topical bacitracin. Protect from the sun to minimize scarring. Cover it with SPF 76 or higher and use hat when out in the sun for 6-9 months. You received 5 stitches that will dissolve on their own.  Watch for signs of infection: pus, redness of the skin surrounding it, or fever. If these develop see your doctor or return to the ER for antibiotics.

## 2019-08-17 NOTE — ED Notes (Signed)
Cindy Estes at [facility] informed daughter will take patient back to facility. EMS cancelled.

## 2019-08-17 NOTE — ED Triage Notes (Signed)
Pt to ED via [facility] from [facility] [place]. Per EMS pt was heard yelling for help. Staff noted pt face down on floor and was helped back to bed by time EMS arrived. EMS states per facility pt appears to be at baseline cognitively w/ hx of dementia.   Upon arrival pt confused. Dr. Alfred Levins at bedside. Presents with large/deep skin tear to middle forehead. No other obvious deformities. Emla cream placed on skin tear by Dr. Alfred Levins.

## 2019-08-17 NOTE — ED Provider Notes (Addendum)
[facility] Emergency Department Provider Note  ____________________________________________  Time seen: Approximately 1:41 AM  I have reviewed the triage vital signs and the nursing notes.   HISTORY  Chief Complaint Fall  Level 5 caveat:  Portions of the history and physical were unable to be obtained due to dementia   HPI Cindy Estes is a 86 y.o. female with history of dementia, hypertension, hyperlipidemia who presents for evaluation of unwitnessed fall.  Patient coming from a nursing home after staff heard her yell for help.  They found her on the floor.  She has a forehead laceration and bruising around both eyes.  Patient has no recollection of the event and according to staff from the nursing home she is currently at baseline.  She denies any pain.  Unknown last tetanus shot.   Past Medical History:  Diagnosis Date   Anxiety    unspecified   Chronic back pain unk   Depression unk   GERD (gastroesophageal reflux disease)    History of hiatal hernia 08/08/2011   Hyperlipidemia, unspecified    Hypertension    Lymphocytic colitis    MGUS (monoclonal gammopathy of unknown significance)    Osteopenia    Scoliosis deformity of spine     Patient Active Problem List   Diagnosis Date Noted   Acute tear lateral meniscus, right, sequela 08/20/2018   Goiter 03/20/2018   Pneumonia 12/19/2017   Mood disorder ([place]) 01/16/2017   Alzheimer's dementia, late onset ([place]) 11/25/2016   Mild episode of recurrent major depressive disorder ([place]) 10/19/2016   Acid reflux 10/31/2015   Essential hypertension 06/09/2015   Scoliosis 06/09/2015   Atypical chest pain 06/09/2015   Chronic bilateral low back pain 06/09/2015   Anxiety disorder 06/09/2015   Deficiency of other specified B group vitamins 06/09/2015   Bilateral edema of lower extremity 05/16/2014   History of hiatal hernia 08/08/2011   Lymphocytic colitis 07/08/2011     Past Surgical History:  Procedure Laterality Date   ABDOMINAL HYSTERECTOMY  1973   HERNIA REPAIR     NECK SURGERY     NISSEN FUNDOPLICATION      Prior to Admission medications   Medication Sig Start Date End Date Taking? Authorizing Provider  acetaminophen (TYLENOL) 325 MG tablet Take 650 mg by mouth 4 (four) times daily.    [provider]  ALPRAZolam (XANAX XR) 1 MG 24 hr tablet Take 1 tablet (1 mg total) by mouth at bedtime. 12/28/18   Medina-Vargas, Monina C, NP  aspirin EC 81 MG tablet Take 81 mg by mouth See admin instructions. Daily on Monday , Wednesday, and Friday for antiplatelet    [provider]  cephALEXin (KEFLEX) 500 MG capsule Take 1 capsule (500 mg total) by mouth 4 (four) times daily for 10 days. 08/17/19 08/27/19  Rudene Re, MD  cyanocobalamin (,VITAMIN B-12,) 1000 MCG/ML injection Inject 1000 mcg once a day on the 6th of the month 06/06/18   [provider]  divalproex (DEPAKOTE SPRINKLE) 125 MG capsule Take 250 mg by mouth 3 (three) times daily with meals.     [provider]  donepezil (ARICEPT) 10 MG tablet Take 10 mg by mouth at bedtime. Per Gurney Maxin, MD    [provider]  lidocaine (LIDODERM) 5 % Place 1 patch onto the skin daily. Remove & Discard patch within 12 hours or as directed by MD @ 8 pm    [provider]  NON FORMULARY Diet: Regular  [provider]  Nutritional Supplements (ENSURE ENLIVE PO) Take 1 Bottle by mouth daily.     [provider]  ondansetron (ZOFRAN-ODT) 4 MG disintegrating tablet Take 4 mg by mouth every 4 (four) hours as needed for nausea or vomiting.    [provider]  valsartan-hydrochlorothiazide (DIOVAN-HCT) 160-12.5 MG tablet Take 1 tablet by mouth daily.    [provider]  venlafaxine XR (EFFEXOR-XR) 75 MG 24 hr capsule Take 1 capsule (75 mg total) by mouth daily. 02/10/18   Gerlene Fee, NP     Allergies Memantine  Family History  Problem Relation Age of Onset   Dementia Father    Heart disease Maternal Grandmother    Diabetes Maternal Grandfather    Other Mother        Died during childbirth   Heart disease Maternal Uncle    Arthritis Other    Diabetes type II Other    Hypertension Other    Heart attack Other    Rheum arthritis Other     Social History Social History   Tobacco Use   Smoking status: Never Smoker   Smokeless tobacco: Never Used  Substance Use Topics   Alcohol use: No    Alcohol/week: 0.0 standard drinks   Drug use: No    Review of Systems  Constitutional: Negative for fever. Eyes: Negative for visual changes. ENT: + facial injury. No neck injury Cardiovascular: Negative for chest injury. Respiratory: Negative for shortness of breath. Negative for chest wall injury. Gastrointestinal: Negative for abdominal pain or injury. Genitourinary: Negative for dysuria. Musculoskeletal: Negative for back injury, negative for arm or leg pain. Skin: Negative for laceration/abrasions. Neurological: + head injury.   ____________________________________________   PHYSICAL EXAM:  VITAL SIGNS: ED Triage Vitals  Enc Vitals Group     BP 08/17/19 0128 (!) 146/63     Pulse Rate 08/17/19 0124 69     Resp 08/17/19 0124 12     Temp 08/17/19 0128 97.6 F (36.4 C)     Temp Source 08/17/19 0128 Oral     SpO2 08/17/19 0118 93 %     Weight 08/17/19 0125 134 lb 7.7 oz (61 kg)     Height 08/17/19 0125 4\' 10"  (1.473 m)     Head Circumference --      Peak Flow --      Pain Score --      Pain Loc --      Pain Edu? --      Excl. in [place]? --     Full spinal precautions maintained throughout the trauma exam. Constitutional: Alert and oriented to self only. No acute distress. Does not appear intoxicated. HEENT Head: Normocephalic. Complex laceration of the forehead. Face: No facial bony tenderness. Stable midface Ears: No hemotympanum  bilaterally. No Battle sign Eyes: No eye injury. PERRL. Periorbital hematoma bilaterally L>R Nose: Nontender. No epistaxis. No rhinorrhea Mouth/Throat: Mucous membranes are moist. No oropharyngeal blood. No dental injury. Airway patent without stridor. Normal voice. Bruised tongue with no laceration. Neck: no C-collar. No midline c-spine tenderness.  Cardiovascular: Normal rate, regular rhythm. Normal and symmetric distal pulses are present in all extremities. Pulmonary/Chest: Chest wall is stable and nontender to palpation/compression. Normal respiratory effort. Breath sounds are normal. No crepitus.  Abdominal: Soft, nontender, non distended. Musculoskeletal: Nontender with normal full range of motion in all extremities. No deformities. No thoracic or lumbar midline spinal tenderness. Pelvis is stable. Skin: Skin is warm, dry and intact. No abrasions or contutions.  Psychiatric: Speech and behavior are appropriate. Neurological: Normal speech and language. Moves all extremities to command. No gross focal neurologic deficits are appreciated.  Glascow Coma Score: 4 - Opens eyes on own 6 - Follows simple motor commands 4 - Seems confused, disoriented GCS: 14   ____________________________________________   LABS (all labs ordered are listed, but only abnormal results are displayed)  Labs Reviewed  CBC WITH DIFFERENTIAL/PLATELET - Abnormal; Notable for the following components:      Result Value   MCV 105.0 (*)    MCH 34.9 (*)    All other components within normal limits  COMPREHENSIVE METABOLIC PANEL - Abnormal; Notable for the following components:   Chloride 97 (*)    Glucose, Bld 103 (*)    BUN 40 (*)    Albumin 3.4 (*)    Alkaline Phosphatase 37 (*)    GFR calc non Af Amer 52 (*)    All other components within normal limits  URINALYSIS, COMPLETE (UACMP) WITH MICROSCOPIC - Abnormal; Notable for the following components:   Color, Urine YELLOW (*)    APPearance CLEAR (*)     Nitrite POSITIVE (*)    Leukocytes,Ua TRACE (*)    Bacteria, UA FEW (*)    All other components within normal limits  URINE CULTURE   ____________________________________________  EKG  ED ECG REPORT I, Rudene Re, the attending physician, personally viewed and interpreted this ECG.  Normal sinus rhythm, rate of 70, normal intervals, normal axis, no ST elevations or depressions, diffuse TW flattening. No significant changes when compared to prior ____________________________________________  RADIOLOGY  I have personally reviewed the images performed during this visit and I agree with the Radiologist's read.   Interpretation by Radiologist:  CT Head Wo Contrast  Result Date: 08/17/2019 CLINICAL DATA:  Unwitnessed fall. Skin tear to forehead. EXAM: CT HEAD WITHOUT CONTRAST TECHNIQUE: Contiguous axial images were obtained from the base of the skull through the vertex without intravenous contrast. COMPARISON:  Head and cervical spine CT 11/07/2016 FINDINGS: Brain: No evidence of acute infarction, hemorrhage, hydrocephalus, extra-axial collection or mass lesion/mass effect. Stable degree of atrophy and chronic small vessel ischemia. Vascular: Atherosclerosis of skullbase vasculature without hyperdense vessel or abnormal calcification. Skull: No fracture or focal lesion. Sinuses/Orbits: Assessed on concurrent face CT, reported separately. Other: Midline frontal scalp hematoma and laceration. Overlying dressing in place. IMPRESSION: Midline frontal scalp hematoma and laceration. No fracture or acute intracranial abnormality. Stable atrophy and chronic small vessel ischemia. Electronically Signed   By: Keith Rake M.D.   On: 08/17/2019 02:40   CT Cervical Spine Wo Contrast  Result Date: 08/17/2019 CLINICAL DATA:  Unwitnessed fall. EXAM: CT CERVICAL SPINE WITHOUT CONTRAST TECHNIQUE: Multidetector CT imaging of the cervical spine was performed without intravenous contrast. Multiplanar CT  image reconstructions were also generated. COMPARISON:  CT 11/07/2016 FINDINGS: Alignment: No traumatic subluxation. Straightening of normal lordosis unchanged. Skull base and vertebrae: No acute fracture. Bony fusion C2, C3, and C4 with rudimentary disc spaces. Angulation about the tip of the dens is chronic and unchanged. None fusion anterior and posterior arch of C1. No acute skull base fracture. Spinous process of C6 is bifid. Soft tissues and spinal canal: No prevertebral fluid or swelling. No visible canal hematoma. Disc levels: Congenital small discs at C2-C3 and C3-C4. degenerative disc disease at C4-C5. Multilevel facet hypertrophy. Upper facets are fused, as seen on prior. Upper chest: Similar thyroid corner with markedly enlarged thyroid gland, diffusely heterogeneous and nodular. No acute findings. Other: Right  anterior first and second ribs are partially fused. IMPRESSION: 1. No acute fracture or subluxation of the cervical spine. 2. Congenital fusion of upper cervical spine with multilevel degenerative change, stable from prior. Electronically Signed   By: Keith Rake M.D.   On: 08/17/2019 02:45   CT Maxillofacial Wo Contrast  Result Date: 08/17/2019 CLINICAL DATA:  Post unwitnessed fall. Forehead laceration. EXAM: CT MAXILLOFACIAL WITHOUT CONTRAST TECHNIQUE: Multidetector CT imaging of the maxillofacial structures was performed. Multiplanar CT image reconstructions were also generated. COMPARISON:  None. FINDINGS: Osseous: Acute minimally depressed right left nasal bone fractures. Slight leftward nasal septal deviation. No fracture of the zygomatic arches, mandibles, or pterygoid plates. Scattered missing teeth. Temporomandibular joints are congruent. Orbits: Right inferior orbital fracture appears chronic/remote. This was seen on prior head CT 11/07/2016. No acute orbital fracture. Both globes are intact. Postsurgical change of the globes bilaterally. No orbital hematoma. Sinuses: No sinus  fracture or fluid level. Soft tissues: Thyroid goiter partially included, chronic and unchanged. Midline frontal scalp laceration and hematoma. Limited intracranial: Assessed on concurrent head CT. No acute findings. IMPRESSION: 1. Acute minimally depressed right and left nasal bone fractures. 2. Chronic/remote right inferior orbital fracture. 3. Midline frontal scalp laceration and hematoma. Electronically Signed   By: Keith Rake M.D.   On: 08/17/2019 02:48      ____________________________________________   PROCEDURES  Procedure(s) performed:yes .Marland KitchenLaceration Repair  Date/Time: 08/17/2019 3:26 AM Performed by: Rudene Re, MD Authorized by: Rudene Re, MD   Consent:    Consent obtained:  Verbal   Consent given by:  Patient and guardian   Risks discussed:  Infection, pain, retained foreign body, poor cosmetic result and poor wound healing Anesthesia (see MAR for exact dosages):    Anesthesia method:  Topical application   Topical anesthetic:  LET Laceration details:    Location:  Face   Face location:  Forehead   Length (cm):  6 Repair type:    Repair type:  Intermediate Pre-procedure details:    Preparation:  Patient was prepped and draped in usual sterile fashion Exploration:    Hemostasis achieved with:  Direct pressure and LET   Wound exploration: entire depth of wound probed and visualized     Wound extent: no fascia violation noted, no foreign bodies/material noted and no underlying fracture noted     Contaminated: no   Treatment:    Area cleansed with:  Saline   Amount of cleaning:  Extensive   Irrigation solution:  Sterile saline   Visualized foreign bodies/material removed: no   Skin repair:    Repair method:  Sutures and Steri-Strips   Suture size:  6-0   Suture material:  Fast-absorbing gut   Suture technique:  Simple interrupted   Number of sutures:  5 Approximation:    Approximation:  Loose Post-procedure details:    Dressing:   Sterile dressing   Patient tolerance of procedure:  Tolerated well, no immediate complications   Critical Care performed:  None ____________________________________________   INITIAL IMPRESSION / ASSESSMENT AND PLAN / ED COURSE  86 y.o. female with history of dementia, hypertension, hyperlipidemia who presents for evaluation of unwitnessed fall.  A&O to self, GCS 14, currently at baseline per SNF staff.  Patient has a complex laceration of the forehead which is actively bleeding, bilateral periorbital hematomas worse on the left, no CT and L-spine tenderness, no chest or abdominal trauma, bilateral lung sounds are normal, full painless range of motion of all extremities with no deformities or tenderness.  Since fall was unwitnessed and patient has no recollection of the event we will check labs, UA, and EKG to rule out alternative etiologies for her fall or possible syncope including dehydration, AKI, anemia, electrolyte abnormalities, dysrhythmias, UTI.  Will give Tylenol for pain.  Will give a tetanus boost.  Will repair laceration.    _________________________ 3:27 AM on 08/17/2019 -----------------------------------------  Laceration was repaired to the best of my ability.  Unfortunately a big piece of skin was missing.  Bleeding was controlled.  Tetanus booster was given.  UA positive for UTI with no signs of sepsis.  Patient received a dose of Rocephin and will be discharged home on Keflex.  Labs otherwise with no acute findings.  EKG with no acute findings.  CT head, cervical spine, and face that showing nasal bone fractures but no other acute findings.  Discussed the findings and care for her laceration with her daughter who is at bedside.  Patient is clear for discharge to her nursing home.    Please note:  Patient was evaluated in Emergency Department today for the symptoms described in the history of present illness. Patient was evaluated in the context of the global COVID-19 pandemic,  which necessitated consideration that the patient might be at risk for infection with the SARS-CoV-2 virus that causes COVID-19. Institutional protocols and algorithms that pertain to the evaluation of patients at risk for COVID-19 are in a state of rapid change based on information released by regulatory bodies including the CDC and federal and state organizations. These policies and algorithms were followed during the patient's care in the ED.  Some ED evaluations and interventions may be delayed as a result of limited staffing during the pandemic.   As part of my medical decision making, I reviewed the following data within the [place] History obtained from family, Nursing notes reviewed and incorporated, Labs reviewed , EKG interpreted , Old EKG reviewed, Old chart reviewed, Radiograph reviewed , Notes from prior ED visits and [place] Controlled Substance Database   ____________________________________________   FINAL CLINICAL IMPRESSION(S) / ED DIAGNOSES   Final diagnoses:  Fall, initial encounter  Closed fracture of nasal bone, initial encounter  Laceration of forehead, initial encounter  Acute cystitis with hematuria      NEW MEDICATIONS STARTED DURING THIS VISIT:  ED Discharge Orders         Ordered    cephALEXin (KEFLEX) 500 MG capsule  4 times daily     08/17/19 0325           Note:  This document was prepared using Dragon voice recognition software and may include unintentional dictation errors.    Alfred Levins, [place], MD 08/17/19 [place], [place], [place] 08/17/19 [phone]

## 2019-08-17 NOTE — ED Notes (Signed)
Pts daughter at bedside  

## 2019-08-19 LAB — URINE CULTURE: Culture: >=100000 — AB

## 2019-08-20 DIAGNOSIS — Z20828 Contact with and (suspected) exposure to other viral communicable diseases: Secondary | ICD-10-CM | POA: Diagnosis not present

## 2019-08-21 DIAGNOSIS — Z9181 History of falling: Secondary | ICD-10-CM | POA: Diagnosis not present

## 2019-08-21 DIAGNOSIS — M6281 Muscle weakness (generalized): Secondary | ICD-10-CM | POA: Diagnosis not present

## 2019-08-21 DIAGNOSIS — R2689 Other abnormalities of gait and mobility: Secondary | ICD-10-CM | POA: Diagnosis not present

## 2019-08-21 DIAGNOSIS — R262 Difficulty in walking, not elsewhere classified: Secondary | ICD-10-CM | POA: Diagnosis not present

## 2019-08-21 DIAGNOSIS — G301 Alzheimer's disease with late onset: Secondary | ICD-10-CM | POA: Diagnosis not present

## 2019-08-23 DIAGNOSIS — R2689 Other abnormalities of gait and mobility: Secondary | ICD-10-CM | POA: Diagnosis not present

## 2019-08-23 DIAGNOSIS — R262 Difficulty in walking, not elsewhere classified: Secondary | ICD-10-CM | POA: Diagnosis not present

## 2019-08-23 DIAGNOSIS — M6281 Muscle weakness (generalized): Secondary | ICD-10-CM | POA: Diagnosis not present

## 2019-08-23 DIAGNOSIS — Z9181 History of falling: Secondary | ICD-10-CM | POA: Diagnosis not present

## 2019-08-23 DIAGNOSIS — G301 Alzheimer's disease with late onset: Secondary | ICD-10-CM | POA: Diagnosis not present

## 2019-08-28 DIAGNOSIS — G301 Alzheimer's disease with late onset: Secondary | ICD-10-CM | POA: Diagnosis not present

## 2019-08-28 DIAGNOSIS — F0390 Unspecified dementia without behavioral disturbance: Secondary | ICD-10-CM | POA: Diagnosis not present

## 2019-08-28 DIAGNOSIS — Z9181 History of falling: Secondary | ICD-10-CM | POA: Diagnosis not present

## 2019-08-28 DIAGNOSIS — M419 Scoliosis, unspecified: Secondary | ICD-10-CM | POA: Diagnosis not present

## 2019-08-28 DIAGNOSIS — R2689 Other abnormalities of gait and mobility: Secondary | ICD-10-CM | POA: Diagnosis not present

## 2019-08-28 DIAGNOSIS — R279 Unspecified lack of coordination: Secondary | ICD-10-CM | POA: Diagnosis not present

## 2019-08-28 DIAGNOSIS — R262 Difficulty in walking, not elsewhere classified: Secondary | ICD-10-CM | POA: Diagnosis not present

## 2019-08-28 DIAGNOSIS — M6281 Muscle weakness (generalized): Secondary | ICD-10-CM | POA: Diagnosis not present

## 2019-08-30 DIAGNOSIS — R279 Unspecified lack of coordination: Secondary | ICD-10-CM | POA: Diagnosis not present

## 2019-08-30 DIAGNOSIS — R262 Difficulty in walking, not elsewhere classified: Secondary | ICD-10-CM | POA: Diagnosis not present

## 2019-08-30 DIAGNOSIS — Z9181 History of falling: Secondary | ICD-10-CM | POA: Diagnosis not present

## 2019-08-30 DIAGNOSIS — G301 Alzheimer's disease with late onset: Secondary | ICD-10-CM | POA: Diagnosis not present

## 2019-08-30 DIAGNOSIS — M6281 Muscle weakness (generalized): Secondary | ICD-10-CM | POA: Diagnosis not present

## 2019-08-30 DIAGNOSIS — F0390 Unspecified dementia without behavioral disturbance: Secondary | ICD-10-CM | POA: Diagnosis not present

## 2019-08-31 DIAGNOSIS — Z23 Encounter for immunization: Secondary | ICD-10-CM | POA: Diagnosis not present

## 2019-09-05 DIAGNOSIS — F0390 Unspecified dementia without behavioral disturbance: Secondary | ICD-10-CM | POA: Diagnosis not present

## 2019-09-05 DIAGNOSIS — F325 Major depressive disorder, single episode, in full remission: Secondary | ICD-10-CM | POA: Diagnosis not present

## 2019-09-05 DIAGNOSIS — Z0000 Encounter for general adult medical examination without abnormal findings: Secondary | ICD-10-CM | POA: Diagnosis not present

## 2019-09-05 DIAGNOSIS — Z789 Other specified health status: Secondary | ICD-10-CM | POA: Diagnosis not present

## 2019-09-06 DIAGNOSIS — G301 Alzheimer's disease with late onset: Secondary | ICD-10-CM | POA: Diagnosis not present

## 2019-09-06 DIAGNOSIS — R262 Difficulty in walking, not elsewhere classified: Secondary | ICD-10-CM | POA: Diagnosis not present

## 2019-09-06 DIAGNOSIS — M6281 Muscle weakness (generalized): Secondary | ICD-10-CM | POA: Diagnosis not present

## 2019-09-06 DIAGNOSIS — Z9181 History of falling: Secondary | ICD-10-CM | POA: Diagnosis not present

## 2019-09-06 DIAGNOSIS — R279 Unspecified lack of coordination: Secondary | ICD-10-CM | POA: Diagnosis not present

## 2019-09-06 DIAGNOSIS — F0390 Unspecified dementia without behavioral disturbance: Secondary | ICD-10-CM | POA: Diagnosis not present

## 2019-09-10 DIAGNOSIS — R262 Difficulty in walking, not elsewhere classified: Secondary | ICD-10-CM | POA: Diagnosis not present

## 2019-09-10 DIAGNOSIS — Z9181 History of falling: Secondary | ICD-10-CM | POA: Diagnosis not present

## 2019-09-10 DIAGNOSIS — M6281 Muscle weakness (generalized): Secondary | ICD-10-CM | POA: Diagnosis not present

## 2019-09-10 DIAGNOSIS — R279 Unspecified lack of coordination: Secondary | ICD-10-CM | POA: Diagnosis not present

## 2019-09-10 DIAGNOSIS — G301 Alzheimer's disease with late onset: Secondary | ICD-10-CM | POA: Diagnosis not present

## 2019-09-10 DIAGNOSIS — F0390 Unspecified dementia without behavioral disturbance: Secondary | ICD-10-CM | POA: Diagnosis not present

## 2019-09-13 ENCOUNTER — Encounter
Admission: RE | Admit: 2019-09-13 | Discharge: 2019-09-13 | Disposition: A | Discharge status: Home / Self Care | Payer: Medicare Other | Source: Ambulatory Visit | Attending: Internal Medicine | Admitting: Internal Medicine

## 2019-09-17 ENCOUNTER — Other Ambulatory Visit
Admission: RE | Admit: 2019-09-17 | Discharge: 2019-09-17 | Disposition: A | Discharge status: Home / Self Care | Payer: Medicare Other | Source: Skilled Nursing Facility | Attending: Internal Medicine | Admitting: Internal Medicine

## 2019-09-17 DIAGNOSIS — R358 Other polyuria: Secondary | ICD-10-CM | POA: Diagnosis not present

## 2019-09-17 DIAGNOSIS — R4182 Altered mental status, unspecified: Secondary | ICD-10-CM | POA: Insufficient documentation

## 2019-09-17 DIAGNOSIS — R8290 Unspecified abnormal findings in urine: Secondary | ICD-10-CM | POA: Insufficient documentation

## 2019-09-17 LAB — URINALYSIS, COMPLETE (UACMP) WITH MICROSCOPIC
Bilirubin Urine: NEGATIVE
Glucose, UA: NEGATIVE mg/dL
Hgb urine dipstick: NEGATIVE
Ketones, ur: NEGATIVE mg/dL
Nitrite: POSITIVE — AB
Protein, ur: 30 mg/dL — AB
Specific Gravity, Urine: 1.021 (ref 1.005–1.030)
WBC, UA: >50 WBC/hpf — ABNORMAL HIGH (ref 0–5)
pH: 7 (ref 5.0–8.0)

## 2019-09-19 LAB — URINE CULTURE: Culture: >=100000 — AB

## 2019-11-07 DIAGNOSIS — L858 Other specified epidermal thickening: Secondary | ICD-10-CM | POA: Diagnosis not present

## 2019-11-07 DIAGNOSIS — C44319 Basal cell carcinoma of skin of other parts of face: Secondary | ICD-10-CM | POA: Diagnosis not present

## 2019-11-07 DIAGNOSIS — Z789 Other specified health status: Secondary | ICD-10-CM | POA: Diagnosis not present

## 2019-11-07 DIAGNOSIS — I10 Essential (primary) hypertension: Secondary | ICD-10-CM | POA: Diagnosis not present

## 2019-11-07 DIAGNOSIS — F0390 Unspecified dementia without behavioral disturbance: Secondary | ICD-10-CM | POA: Diagnosis not present

## 2019-11-07 DIAGNOSIS — F325 Major depressive disorder, single episode, in full remission: Secondary | ICD-10-CM | POA: Diagnosis not present

## 2019-11-16 ENCOUNTER — Other Ambulatory Visit
Admission: RE | Admit: 2019-11-16 | Discharge: 2019-11-16 | Disposition: A | Discharge status: Home / Self Care | Payer: Medicare Other | Source: Ambulatory Visit | Attending: Internal Medicine | Admitting: Internal Medicine

## 2019-11-16 DIAGNOSIS — N390 Urinary tract infection, site not specified: Secondary | ICD-10-CM | POA: Diagnosis not present

## 2019-11-16 LAB — URINALYSIS, ROUTINE W REFLEX MICROSCOPIC
Bilirubin Urine: NEGATIVE
Glucose, UA: NEGATIVE mg/dL
Ketones, ur: NEGATIVE mg/dL
Nitrite: POSITIVE — AB
Protein, ur: NEGATIVE mg/dL
Specific Gravity, Urine: 1.017 (ref 1.005–1.030)
WBC, UA: >50 WBC/hpf — ABNORMAL HIGH (ref 0–5)
pH: 6 (ref 5.0–8.0)

## 2019-11-18 LAB — URINE CULTURE: Culture: >=100000 — AB

## 2019-11-26 ENCOUNTER — Encounter
Admission: RE | Admit: 2019-11-26 | Discharge: 2019-11-26 | Disposition: A | Discharge status: Home / Self Care | Payer: Medicare Other | Source: Ambulatory Visit | Attending: Internal Medicine | Admitting: Internal Medicine

## 2019-12-13 DIAGNOSIS — T17908D Unspecified foreign body in respiratory tract, part unspecified causing other injury, subsequent encounter: Secondary | ICD-10-CM | POA: Diagnosis not present

## 2019-12-27 DIAGNOSIS — R600 Localized edema: Secondary | ICD-10-CM | POA: Diagnosis not present

## 2019-12-27 DIAGNOSIS — Z789 Other specified health status: Secondary | ICD-10-CM | POA: Diagnosis not present

## 2019-12-27 DIAGNOSIS — F0390 Unspecified dementia without behavioral disturbance: Secondary | ICD-10-CM | POA: Diagnosis not present

## 2019-12-31 ENCOUNTER — Telehealth: Payer: Self-pay

## 2019-12-31 ENCOUNTER — Other Ambulatory Visit: Payer: Medicare Other

## 2019-12-31 ENCOUNTER — Other Ambulatory Visit: Payer: Self-pay

## 2019-12-31 DIAGNOSIS — Z515 Encounter for palliative care: Secondary | ICD-10-CM

## 2019-12-31 NOTE — Progress Notes (Signed)
PATIENT NAME: Cindy Estes DOB: 09-12-1923 MRN: [id number]  PRIMARY CARE PROVIDER: Kirk Ruths, MD  RESPONSIBLE PARTY:  Acct ID - Guarantor Home Phone Work Phone Relationship Acct Type  [id number] BABYGIRL, [phone] Self P/F     [place], [place] [address]    PLAN OF CARE and INTERVENTIONS:               1.  GOALS OF CARE/ ADVANCE CARE PLANNING:  Daughter reports family wants patient to be comfortable.                2.  PATIENT/CAREGIVER EDUCATION:  Education on aspiration precautions, education on fall precautions, education on palliative care services, education on s/s of infection, reviewed meds, support               4. PERSONAL EMERGENCY PLAN:  Patient resides at [place] and has 24/7 hour care.                5.  DISEASE STATUS:   RN made scheduled palliative care visit to facility. Nurse met with patient and patients daughter Mardene Celeste. Patient resides at [facility] and has 24/7 care. Facility nurse Baxter Flattery at facility and nurse reviewed patient's medications with nurse. Patient's past medical history includes but not limited to hypertension history of pneumonia, reflux, lymphocytic colitis, goiter, Alzheimer's dementia, scoliosis, atypical chest pain, chronic bilateral low back pain, anxiety disorder, B12 deficiency, edema of lower extremities, history of hiatal hernia, mood disorder and mild episode of recurrent major depressive disorder. Patient at table in dining area. Daughter assisting patient with eating her lunch. Patients appetite is poor and patient is drinking nutritional supplements throughout the day. Daughter reports patient has received covid vaccines. Daughter reports patient was living in apartments downstairs but was transferred up to skilled facility 6 months ago. Patients daughter reports she typically visits weekly however patient cannot remember when daughter visits. Daughter reports family's goal is for patient to  be comfortable. Patient has 2 sons in addition to daughter. Dr. Ouida Sills felt patient maybe aspirating as patient is coughing worse with eating and drinking per daughter. Patient keeps asking nurse and daughter what is wrong with her. Tara LPN at facility reports patient also gets Xanax in the morning in addition to nighttime dose and patient takes Celebrex PRN as well as trazodone at night time. Patient is also on Prostat twice a day. Daughter reports patient complains frequently of pain in her lower back. Patient has been sleeping well and staff nurse reports patient sleeps a lot during the day today. Patient was placed on O2 recently due to O2 SATs dropping. Patient had O2 off while eating and SATs were 92%. Patient receiving O2 at 3 L per minute when she went back to her room. Nurse made recommendation for mechanical soft diet for patient. Nurse to follow up with Dr Ouida Sills regarding change in diet.  Daughter reports patient has a DNR on chart. Patients current weight on June 4th was 125.9 pounds.  Patient suffered a fall in January requiring stitches to her upper forehead. Patient has not suffered any recent falls.  Daughter in agreement with palliative care services.  Daughter and facility staff encouraged to call with questions or concerns.         HISTORY OF PRESENT ILLNESS:  Patient is a 86 year old female who resides at [facility] [facility] [facility]  Patients daughter Mardene Celeste open to palliative care services for patient.  Patient will  be seen monthly and PRN.    CODE STATUS: DNR  ADVANCED DIRECTIVES: MOST FORM: No PPS: 40%   PHYSICAL EXAM:   VITALS: Today's Vitals   12/31/19 1259  BP: (!) 110/58  Pulse: 81  Resp: 18  Temp: 98.7 F (37.1 C)  TempSrc: Temporal  SpO2: 92%  Weight: 125 lb 14.4 oz (57.1 kg)  Height: '4\' 10"'  (1.473 m)  PainSc: 0-No pain    LUNGS: decreased breath sounds CARDIAC: Cor irreg, irreg RRR heart rate ranging from 82-104  EXTREMITIES: 2+ edema SKIN: no visible  open areas of skin breakdown  NEURO: positive for gait problems, memory problems and weakness       Nilda Simmer, RN

## 2019-12-31 NOTE — Telephone Encounter (Signed)
Telephone call to patients daughter Mardene Celeste to schedule palliative care visit.  Daughter in agreement with RN making visit today at 12:30 PM.

## 2020-01-09 ENCOUNTER — Encounter
Admission: RE | Admit: 2020-01-09 | Discharge: 2020-01-09 | Disposition: A | Discharge status: Home / Self Care | Payer: Medicare Other | Source: Ambulatory Visit | Attending: Internal Medicine | Admitting: Internal Medicine

## 2020-02-07 DIAGNOSIS — R600 Localized edema: Secondary | ICD-10-CM | POA: Diagnosis not present

## 2020-02-07 DIAGNOSIS — F325 Major depressive disorder, single episode, in full remission: Secondary | ICD-10-CM | POA: Diagnosis not present

## 2020-02-07 DIAGNOSIS — F0390 Unspecified dementia without behavioral disturbance: Secondary | ICD-10-CM | POA: Diagnosis not present

## 2020-02-07 DIAGNOSIS — Z789 Other specified health status: Secondary | ICD-10-CM | POA: Diagnosis not present

## 2020-02-11 ENCOUNTER — Telehealth: Payer: Self-pay

## 2020-02-11 NOTE — Telephone Encounter (Signed)
Palliative care SW spoke with patients daughte to schedule palliative care visit. Visit confimed for Fri 02-15-2020 @11am .

## 2020-02-15 ENCOUNTER — Other Ambulatory Visit: Payer: Medicare Other

## 2020-02-15 ENCOUNTER — Other Ambulatory Visit: Payer: Self-pay

## 2020-02-15 DIAGNOSIS — Z515 Encounter for palliative care: Secondary | ICD-10-CM

## 2020-02-15 NOTE — Progress Notes (Signed)
COMMUNITY PALLIATIVE CARE SW NOTE  PATIENT NAME: Cindy Estes DOB: [birth year] MRN: [id number]  PRIMARY CARE PROVIDER: Kirk Ruths, MD  RESPONSIBLE PARTY:  Acct ID - Guarantor Home Phone Work Phone Relationship Acct Type  [id number] LUANN, [phone] Self P/F     [address] [place], [place] [address]     PLAN OF CARE and INTERVENTIONS:             1. GOALS OF CARE/ ADVANCE CARE PLANNING:  Patient is a DNR - on chart at SNF. Patients goal is to be comfortable and remain at [facility]. 2. SOCIAL/EMOTIONAL/SPIRITUAL ASSESSMENT/ INTERVENTIONS: SW met with patients nurse, Cloyde Reams, for scheduled visit. Patient was sitting in recliner asleep with 3LPM O2 on, SW did not wake patient for visit. Daughter unable to to attend this visit. Nurse shared that patients appetite fluctuates depending what she in due to her Alzheimer's, daughter visits mostly during meals to assist with feeding. Patients current weight is 129.2. No medication changes noted. No skin breakdown. Per nurse patient becomes very teary and sad when daughter leaves. Patient is on Alprazolam to assist with mood and behaviors. Patient tends to yell out for assistance. Patient is continent sometimes but mostly incontinent. Per nurse patient is stable. SW educated palliative care services. Palliative care will continue to follow. PATIENT/CAREGIVER EDUCATION/ COPING: Per facility staff patient is alert and oriented to self with baseline confusion. Family is supportive and visits often. 3. d/t dementia. Able to verbalize needs. 4. PERSONAL EMERGENCY PLAN:  Will follow facility emergency protocol.  5. COMMUNITY RESOURCES COORDINATION/ HEALTH CARE NAVIGATION:  Facility manages care and connect patient/family with resources as appropriate. 6. FINANCIAL/LEGAL CONCERNS/INTERVENTIONS:  None.     SOCIAL HX:  Social History   Tobacco Use  . Smoking status: Never Smoker  . Smokeless tobacco: Never  Used  Substance Use Topics  . Alcohol use: No    Alcohol/week: 0.0 standard drinks    CODE STATUS: DNR ADVANCED DIRECTIVES: N MOST FORM COMPLETE:  N HOSPICE EDUCATION PROVIDED: N  PPS: Facility assist with all ADLS' including feeding.Requires moderate assist with ADLs. Transfers via stand/pivot surface/surface with assist   Time Spent: 30 mins   [place] Henrene Pastor, [place]

## 2020-02-25 ENCOUNTER — Ambulatory Visit: Payer: Medicare Other | Admitting: Podiatry

## 2020-02-27 DIAGNOSIS — R600 Localized edema: Secondary | ICD-10-CM | POA: Diagnosis not present

## 2020-02-28 ENCOUNTER — Ambulatory Visit: Payer: Medicare Other | Admitting: Podiatry

## 2020-03-03 ENCOUNTER — Ambulatory Visit: Payer: Medicare Other | Admitting: Podiatry

## 2020-03-03 DIAGNOSIS — Z20828 Contact with and (suspected) exposure to other viral communicable diseases: Secondary | ICD-10-CM | POA: Diagnosis not present

## 2020-03-08 IMAGING — CT CT MAXILLOFACIAL W/O CM
3 series · 16 of 47 positions shown, 19 images · non-contrast
Comparison: None.

CLINICAL DATA: Post unwitnessed fall. Forehead laceration.

EXAM:
CT MAXILLOFACIAL WITHOUT CONTRAST
TECHNIQUE: Multidetector CT imaging of the maxillofacial structures was
performed. Multiplanar CT image reconstructions were also generated.

[Series 2: max soft · axial · 0.34mm/px · z∈[-176,-20]mm · 10 of 92 slices shown, 13 images]
[im 7/92  brain]
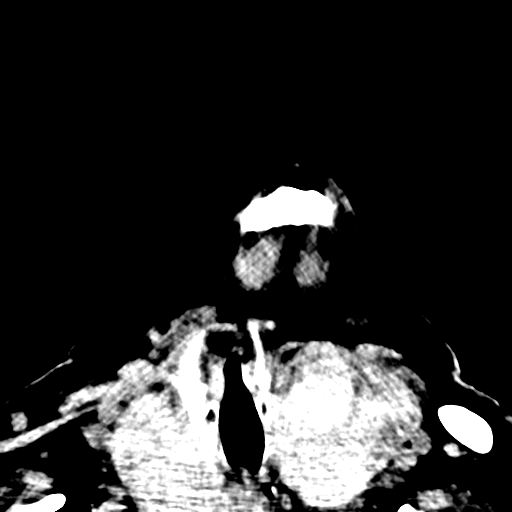
[im 7/92  bone]
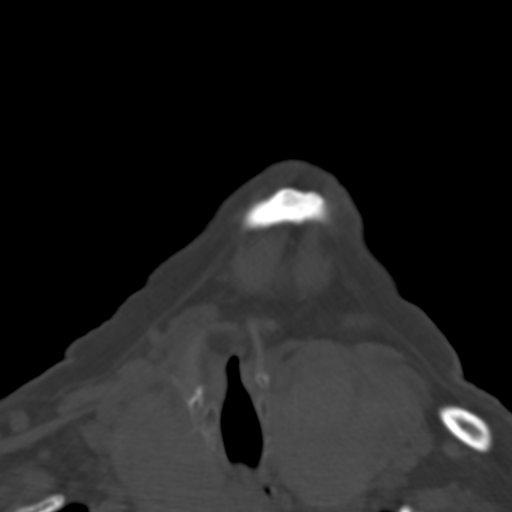
[im 16/92  bone]
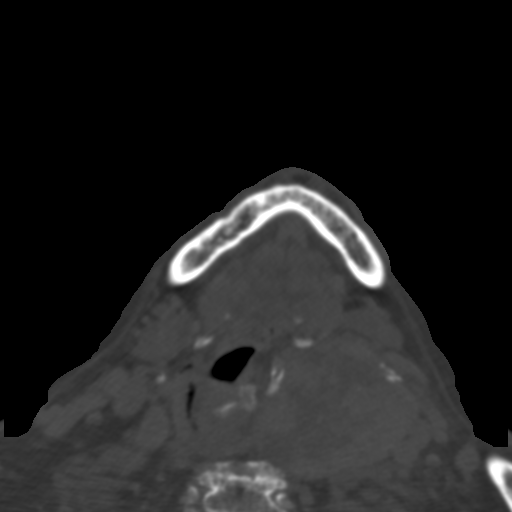
[im 26/92  bone]
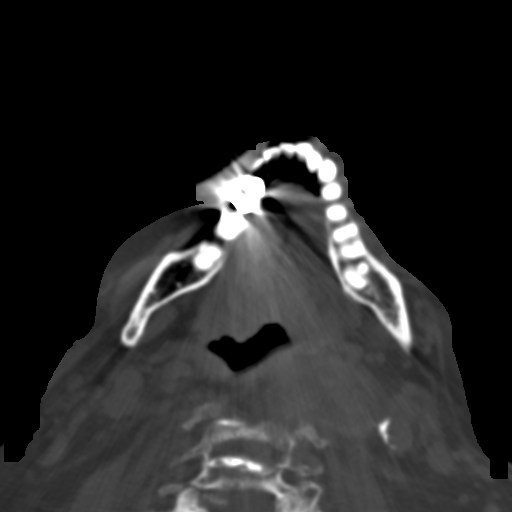
[im 32/92  bone]
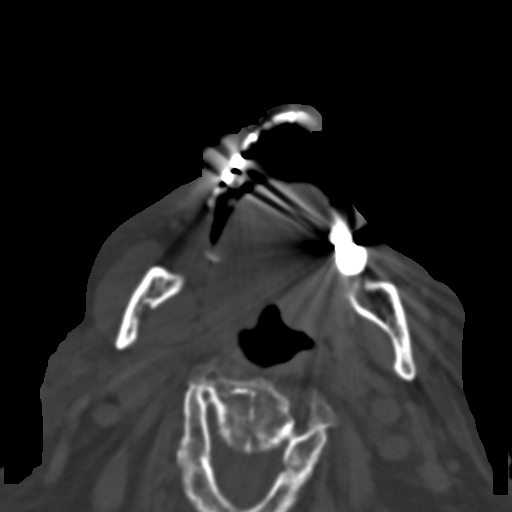
[im 41/92  brain]
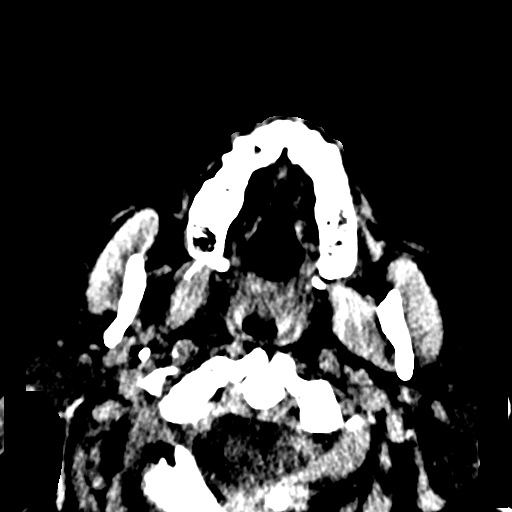
[im 41/92  bone]
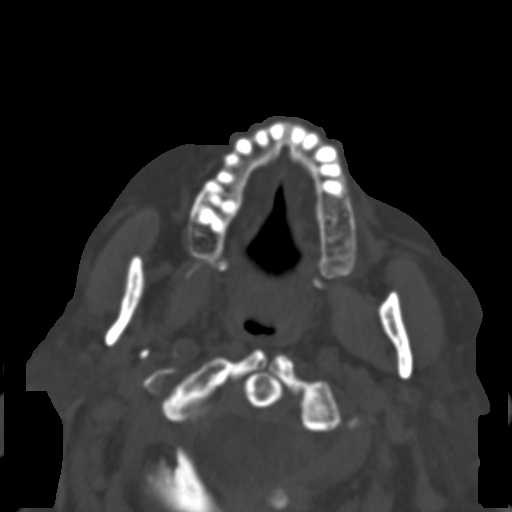
[im 51/92  bone]
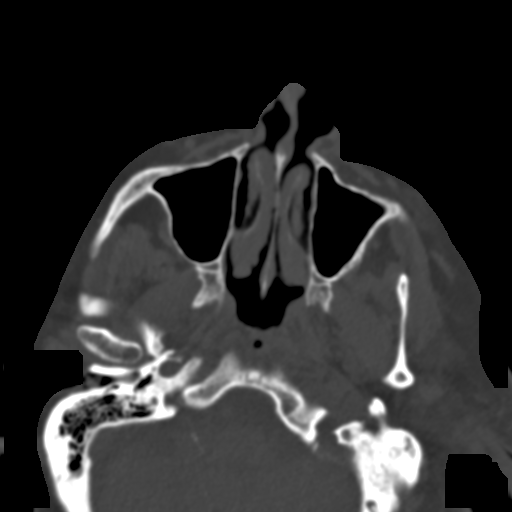
[im 60/92  bone]
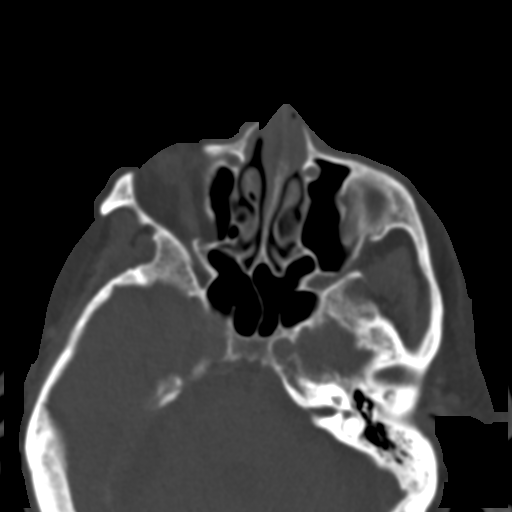
[im 70/92  bone]
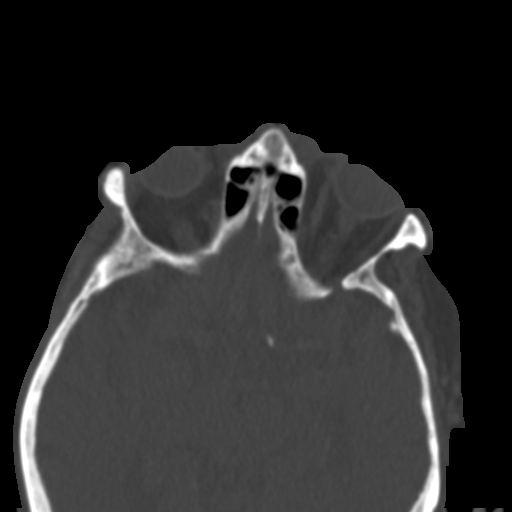
[im 76/92  brain]
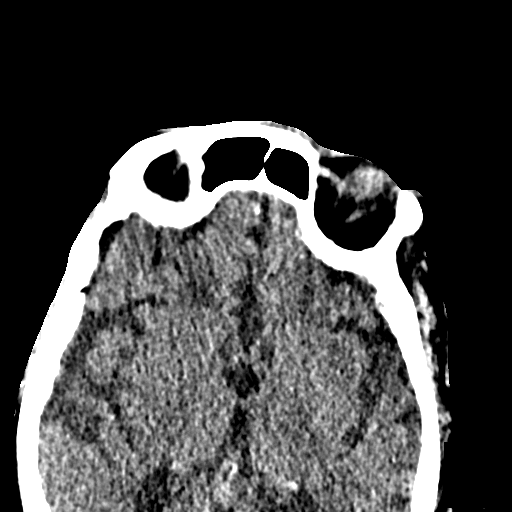
[im 76/92  bone]
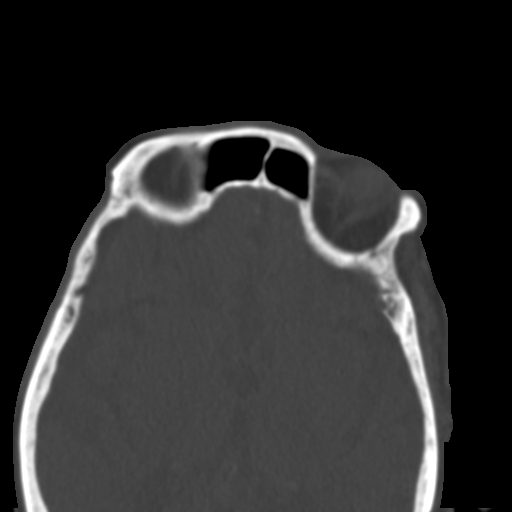
[im 85/92  bone]
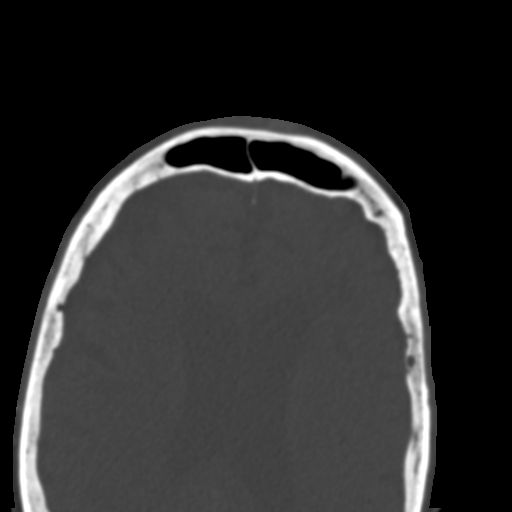

[Series 6: coronal soft · coronal · 0.35mm/px · 3 of 57 slices shown]
[im 19/57  bone]
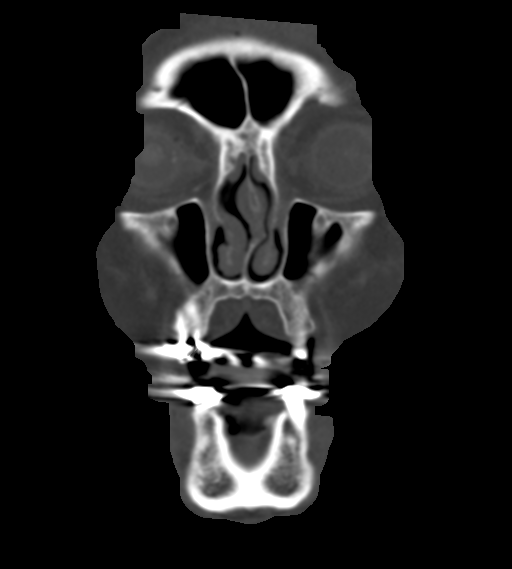
[im 25/57  bone]
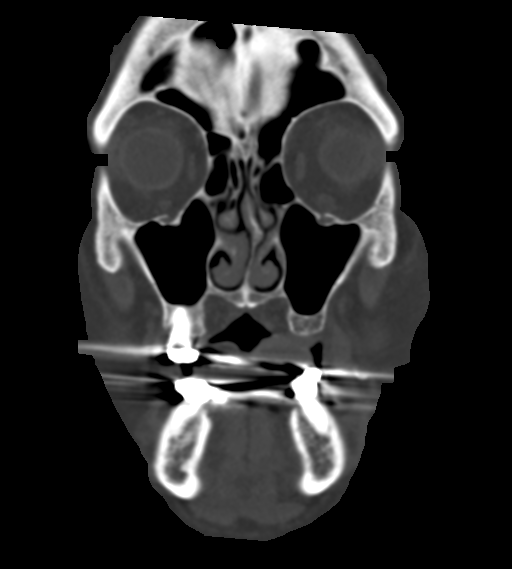
[im 32/57  bone]
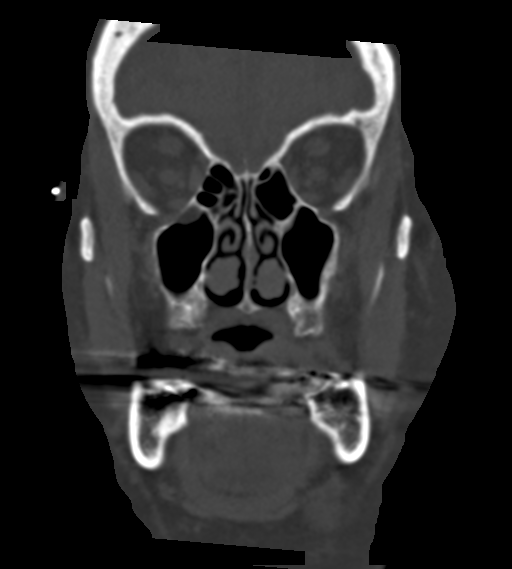

[Series 7: sagittal soft · sagittal · 0.33mm/px · 3 of 84 slices shown]
[im 28/84  bone]
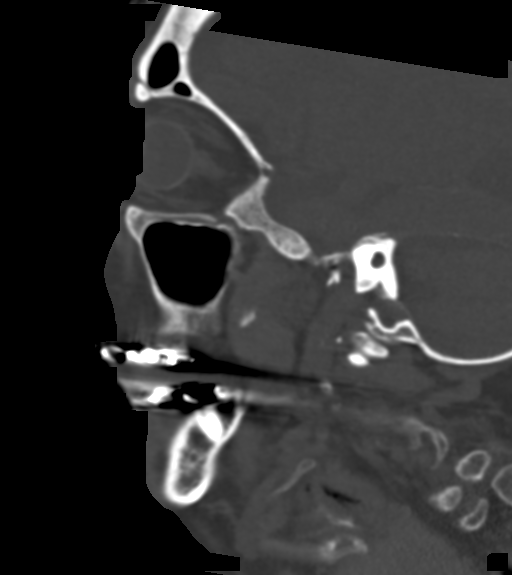
[im 42/84  bone]
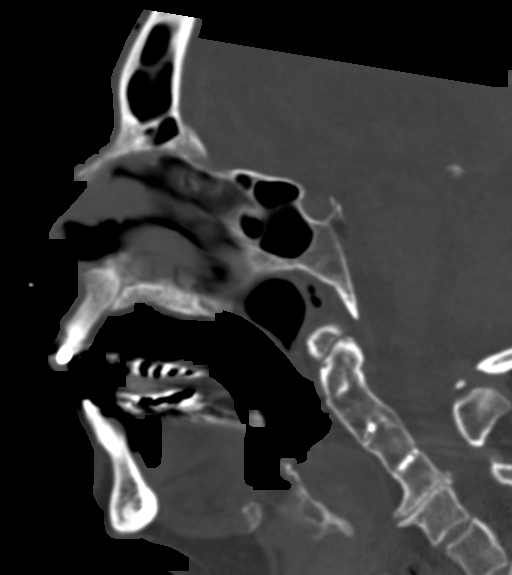
[im 56/84  bone]
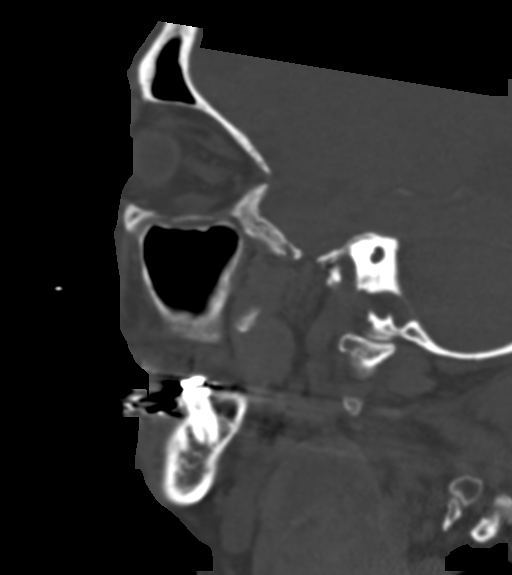

[16 of 47 positions shown; findings below may reference images not displayed]

FINDINGS: Osseous: Acute minimally depressed right left nasal bone fractures.
Slight leftward nasal septal deviation. No fracture of the zygomatic
arches, mandibles, or pterygoid plates. Scattered missing teeth.
Temporomandibular joints are congruent.

Orbits: Right inferior orbital fracture appears chronic/remote. This
was seen on prior head CT 11/07/2016. No acute orbital fracture.
Both globes are intact. Postsurgical change of the globes
bilaterally. No orbital hematoma.

Sinuses: No sinus fracture or fluid level.

Soft tissues: Thyroid goiter partially included, chronic and
unchanged. Midline frontal scalp laceration and hematoma.

Limited intracranial: Assessed on concurrent head CT. No acute
findings.
IMPRESSION: 1. Acute minimally depressed right and left nasal bone fractures.
2. Chronic/remote right inferior orbital fracture.
3. Midline frontal scalp laceration and hematoma.

## 2020-03-08 IMAGING — CT CT CERVICAL SPINE W/O CM
2 series · 10 of 27 positions shown, 13 images · non-contrast
Comparison: CT 11/07/2016

CLINICAL DATA: Unwitnessed fall.

EXAM:
CT CERVICAL SPINE WITHOUT CONTRAST
TECHNIQUE: Multidetector CT imaging of the cervical spine was performed without
intravenous contrast. Multiplanar CT image reconstructions were also
generated.

[Series 3: c spine soft · axial · 0.38mm/px · z∈[-178,-96]mm · 5 of 59 slices shown, 7 images]
[im 9/59  soft-tissue]
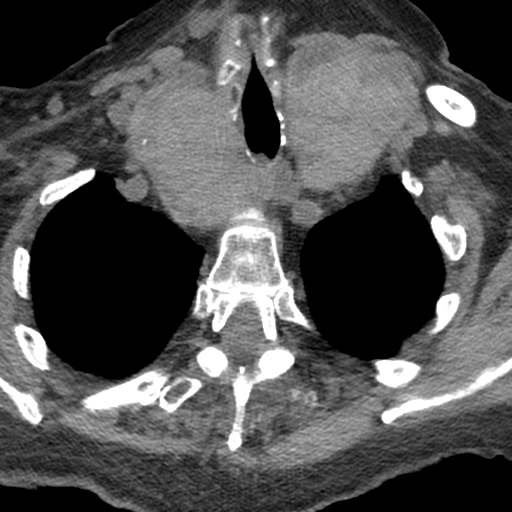
[im 9/59  bone]
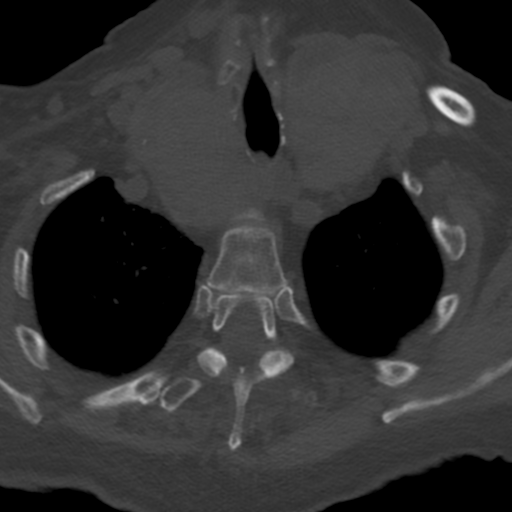
[im 18/59  bone]
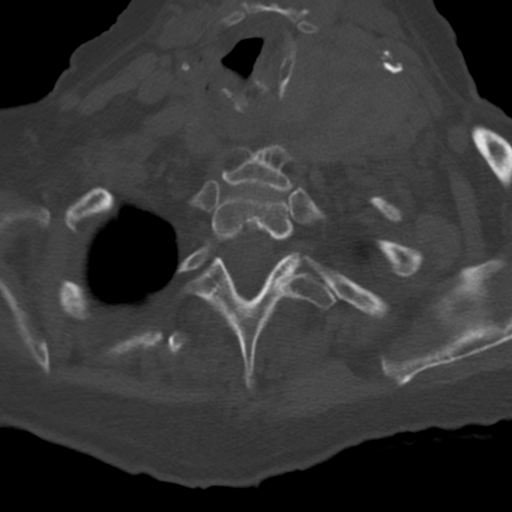
[im 32/59  bone]
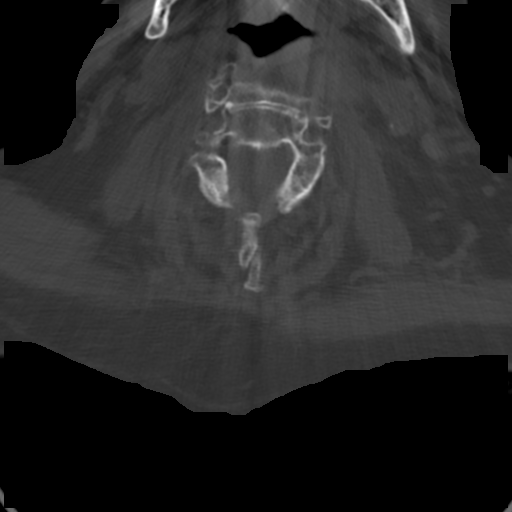
[im 41/59  bone]
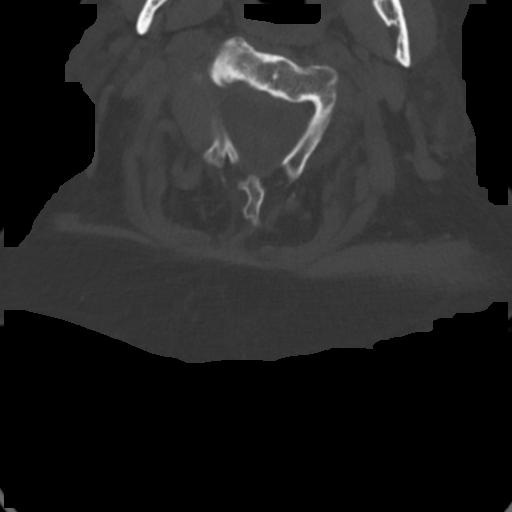
[im 50/59  soft-tissue]
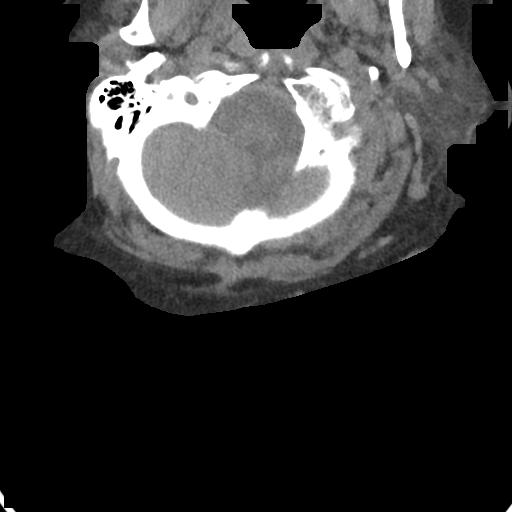
[im 50/59  bone]
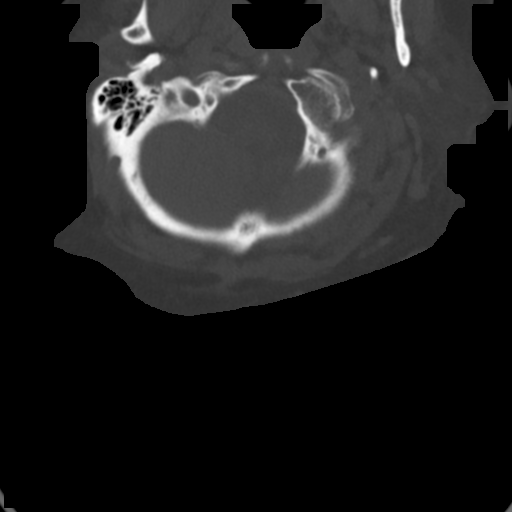

[Series 4: sagittal bone · sagittal · 0.23mm/px · 5 of 44 slices shown, 6 images]
[im 15/44  bone]
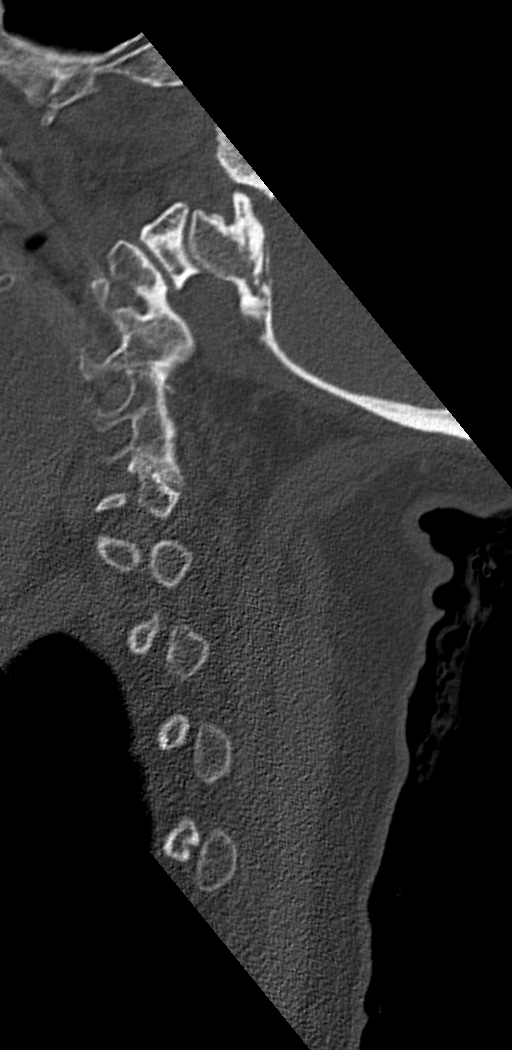
[im 18/44  bone]
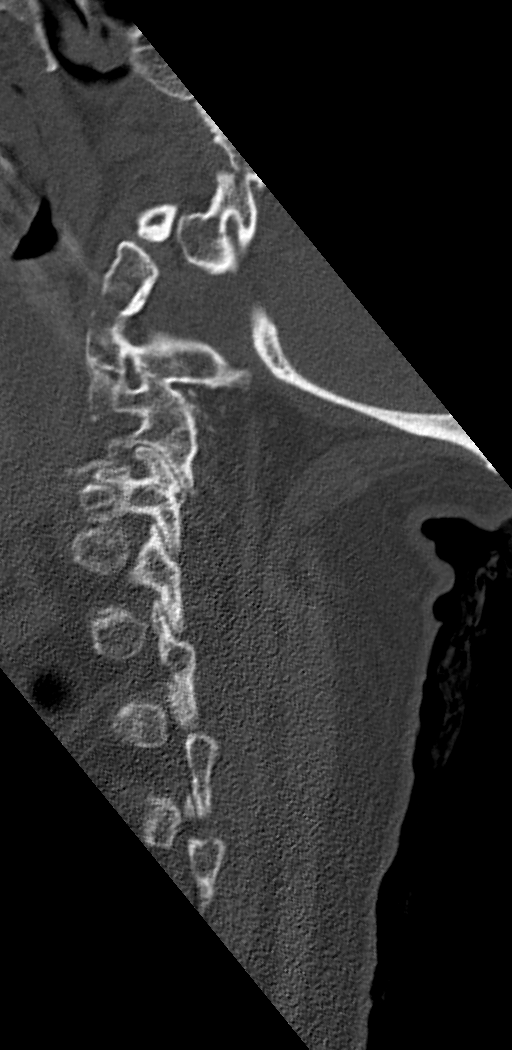
[im 22/44  soft-tissue]
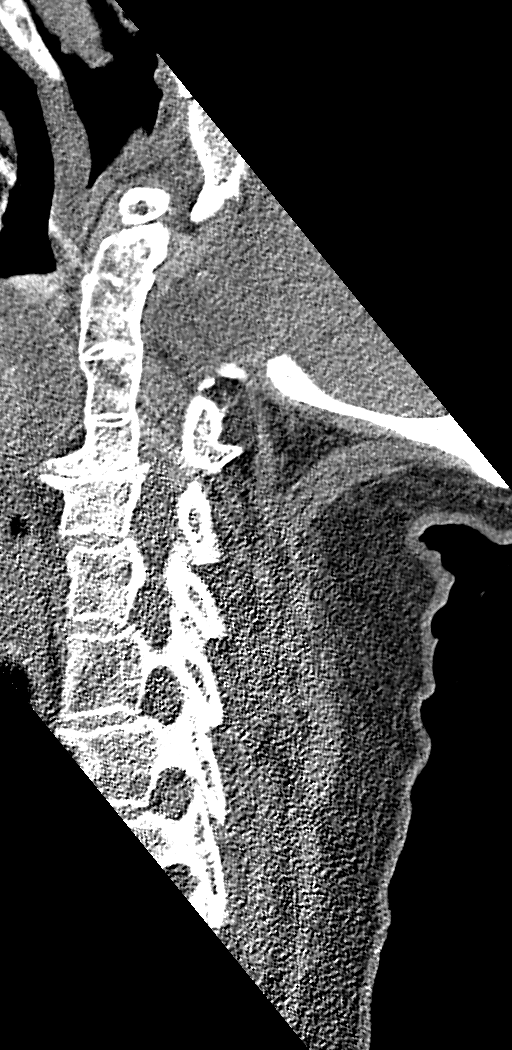
[im 22/44  bone]
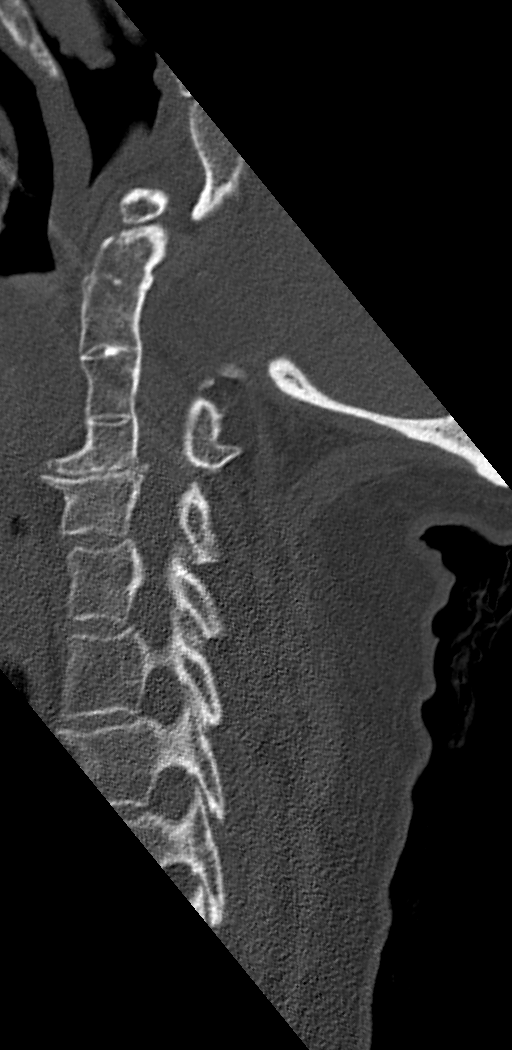
[im 26/44  bone]
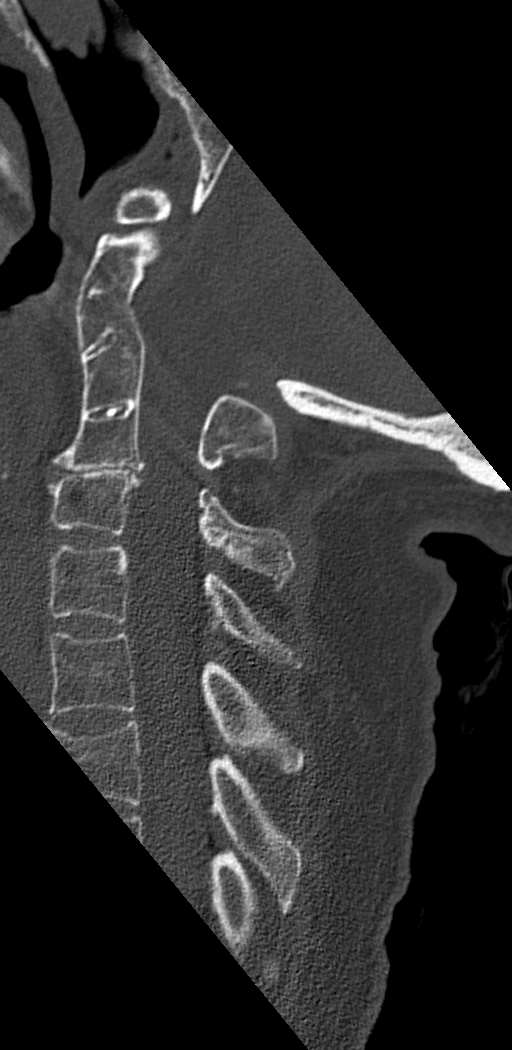
[im 29/44  bone]
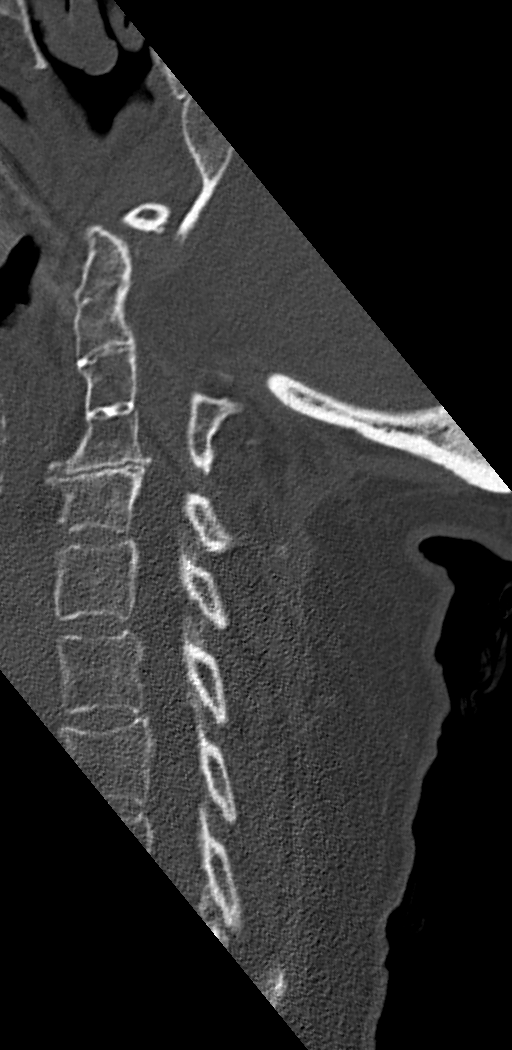

[10 of 27 positions shown; findings below may reference images not displayed]

FINDINGS: Alignment: No traumatic subluxation. Straightening of normal
lordosis unchanged.

Skull base and vertebrae: No acute fracture. Bony fusion C2, C3, and
C4 with rudimentary disc spaces. Angulation about the tip of the
dens is chronic and unchanged. None fusion anterior and posterior
arch of C1. No acute skull base fracture. Spinous process of C6 is
bifid.

Soft tissues and spinal canal: No prevertebral fluid or swelling. No
visible canal hematoma.

Disc levels: Congenital small discs at C2-C3 and C3-C4. degenerative
disc disease at C4-C5. Multilevel facet hypertrophy. Upper facets
are fused, as seen on prior.

Upper chest: Similar thyroid corner with markedly enlarged thyroid
gland, diffusely heterogeneous and nodular. No acute findings.

Other: Right anterior first and second ribs are partially fused.
IMPRESSION: 1. No acute fracture or subluxation of the cervical spine.
2. Congenital fusion of upper cervical spine with multilevel
degenerative change, stable from prior.

## 2020-03-10 DIAGNOSIS — Z20828 Contact with and (suspected) exposure to other viral communicable diseases: Secondary | ICD-10-CM | POA: Diagnosis not present

## 2020-03-26 ENCOUNTER — Other Ambulatory Visit
Admission: RE | Admit: 2020-03-26 | Discharge: 2020-03-26 | Disposition: A | Discharge status: Home / Self Care | Payer: Medicare Other | Source: Ambulatory Visit | Attending: Internal Medicine | Admitting: Internal Medicine

## 2020-03-26 DIAGNOSIS — R350 Frequency of micturition: Secondary | ICD-10-CM | POA: Insufficient documentation

## 2020-03-26 DIAGNOSIS — R410 Disorientation, unspecified: Secondary | ICD-10-CM | POA: Insufficient documentation

## 2020-03-26 LAB — URINALYSIS, COMPLETE (UACMP) WITH MICROSCOPIC
Bilirubin Urine: NEGATIVE
Glucose, UA: NEGATIVE mg/dL
Ketones, ur: NEGATIVE mg/dL
Nitrite: POSITIVE — AB
Protein, ur: NEGATIVE mg/dL
Specific Gravity, Urine: 1.02 (ref 1.005–1.030)
WBC, UA: >50 WBC/hpf — ABNORMAL HIGH (ref 0–5)
pH: 7 (ref 5.0–8.0)

## 2020-03-27 DIAGNOSIS — F0390 Unspecified dementia without behavioral disturbance: Secondary | ICD-10-CM | POA: Diagnosis not present

## 2020-03-28 ENCOUNTER — Encounter
Admission: RE | Admit: 2020-03-28 | Discharge: 2020-03-28 | Disposition: A | Discharge status: Home / Self Care | Payer: Medicare Other | Source: Ambulatory Visit | Attending: Internal Medicine | Admitting: Internal Medicine

## 2020-03-28 LAB — URINE CULTURE: Culture: >=100000 — AB

## 2020-04-16 DIAGNOSIS — R279 Unspecified lack of coordination: Secondary | ICD-10-CM | POA: Diagnosis not present

## 2020-04-16 DIAGNOSIS — M419 Scoliosis, unspecified: Secondary | ICD-10-CM | POA: Diagnosis not present

## 2020-04-16 DIAGNOSIS — G301 Alzheimer's disease with late onset: Secondary | ICD-10-CM | POA: Diagnosis not present

## 2020-04-16 DIAGNOSIS — Z9181 History of falling: Secondary | ICD-10-CM | POA: Diagnosis not present

## 2020-04-16 DIAGNOSIS — M6281 Muscle weakness (generalized): Secondary | ICD-10-CM | POA: Diagnosis not present

## 2020-04-16 DIAGNOSIS — F0390 Unspecified dementia without behavioral disturbance: Secondary | ICD-10-CM | POA: Diagnosis not present

## 2020-04-16 DIAGNOSIS — R2689 Other abnormalities of gait and mobility: Secondary | ICD-10-CM | POA: Diagnosis not present

## 2020-04-16 DIAGNOSIS — R262 Difficulty in walking, not elsewhere classified: Secondary | ICD-10-CM | POA: Diagnosis not present

## 2020-04-18 ENCOUNTER — Other Ambulatory Visit: Payer: Self-pay

## 2020-04-18 ENCOUNTER — Non-Acute Institutional Stay: Payer: Medicare Other | Admitting: Adult Health Nurse Practitioner

## 2020-04-18 DIAGNOSIS — F0281 Dementia in other diseases classified elsewhere with behavioral disturbance: Secondary | ICD-10-CM

## 2020-04-18 DIAGNOSIS — Z515 Encounter for palliative care: Secondary | ICD-10-CM | POA: Diagnosis not present

## 2020-04-18 DIAGNOSIS — F02818 Dementia in other diseases classified elsewhere, unspecified severity, with other behavioral disturbance: Secondary | ICD-10-CM

## 2020-04-19 NOTE — Progress Notes (Signed)
[facility] Consult Note Telephone: [phone]  Fax: [phone]  PATIENT NAME: Cindy Estes DOB: 03/16/1924 MRN: [id number]  PRIMARY CARE PROVIDER:   Kirk Ruths, MD  REFERRING PROVIDER:  Kirk Ruths, MD [facility] [place],  [place] [address]  RESPONSIBLE PARTY:   Yicel Shannon, daughter [phone]    RECOMMENDATIONS and PLAN:  1.  Advanced care planning.  Patient is DNR.  Will call daughter to update on visit.  2.  Functional status.  Patient requires assistance with ADLs including feeding.  She is incontinent of B&B.  Staff does report that she has frequent falls.  Has not had any recent major injury related to falls.  Does have skin tear to left calf that was treated with Keflex and staff reports it is getting better. Patient is sleepy today and does not participate with ROS. Continue supportive care at facility.    3.  Nutritional status.  Staff reports poor appetite.  Does supplement with Ensure.  Patient has actually had a slight weight increase.  Was 129.2 on 02/15/20 and on 04/15/20 weighs 131.8.  Continue assisting with feeding and supplementation and monitoring weights.  Palliative will continue to monitor for symptom management/decline and make recommendations as needed.  Will follow up in 6-8 weeks.    I spent 20 minutes providing this consultation,  from 10:00 to 10:20 including time spent with patient/family, chart review, provider coordination, documentation. More than 50% of the time in this consultation was spent coordinating communication.   HISTORY OF PRESENT ILLNESS:  Cindy Estes is a 86 y.o. year old female with multiple medical problems including Alzheimer's dementia, HLD, HTN, lymphocitic colitis, scoliosis. Palliative Care was asked to help address goals of care.   CODE STATUS: DNR  PPS: 40% HOSPICE ELIGIBILITY/DIAGNOSIS: TBD  PHYSICAL EXAM:  HR 66 O2  94% on 2L General: NAD, frail appearing Cardiovascular: regular rate and rhythm Pulmonary: clear ant fields Extremities: no edema, no joint deformities Skin: no rashes on exposed skin Neurological: Weakness; patient sleepy today and did not participate in ROS  PAST MEDICAL HISTORY:  Past Medical History:  Diagnosis Date  . Anxiety    unspecified  . Chronic back pain unk  . Depression unk  . GERD (gastroesophageal reflux disease)   . History of hiatal hernia 08/08/2011  . Hyperlipidemia, unspecified   . Hypertension   . Lymphocytic colitis   . MGUS (monoclonal gammopathy of unknown significance)   . Osteopenia   . Scoliosis deformity of spine     SOCIAL HX:  Social History   Tobacco Use  . Smoking status: Never Smoker  . Smokeless tobacco: Never Used  Substance Use Topics  . Alcohol use: No    Alcohol/week: 0.0 standard drinks    ALLERGIES:  Allergies  Allergen Reactions  . Memantine Other (See Comments)    Upset stomach     PERTINENT MEDICATIONS:  Outpatient Encounter Medications as of 04/18/2020  Medication Sig  . acetaminophen (TYLENOL) 325 MG tablet Take 650 mg by mouth 4 (four) times daily.  Marland Kitchen ALPRAZolam (XANAX XR) 1 MG 24 hr tablet Take 1 tablet (1 mg total) by mouth at bedtime.  Marland Kitchen aspirin EC 81 MG tablet Take 81 mg by mouth See admin instructions. Daily on Monday , Wednesday, and Friday for antiplatelet  . cyanocobalamin (,VITAMIN B-12,) 1000 MCG/ML injection Inject 1000 mcg once a day on the 6th of the month  .  divalproex (DEPAKOTE SPRINKLE) 125 MG capsule Take 250 mg by mouth 3 (three) times daily with meals.   . donepezil (ARICEPT) 10 MG tablet Take 10 mg by mouth at bedtime. Per Gurney Maxin, MD  . lidocaine (LIDODERM) 5 % Place 1 patch onto the skin daily. Remove & Discard patch within 12 hours or as directed by MD @ 8 pm  . NON FORMULARY Diet: Regular  . Nutritional Supplements (ENSURE ENLIVE PO) Take 1 Bottle by mouth daily.   . ondansetron  (ZOFRAN-ODT) 4 MG disintegrating tablet Take 4 mg by mouth every 4 (four) hours as needed for nausea or vomiting.  . valsartan-hydrochlorothiazide (DIOVAN-HCT) 160-12.5 MG tablet Take 1 tablet by mouth daily.  Marland Kitchen venlafaxine XR (EFFEXOR-XR) 75 MG 24 hr capsule Take 1 capsule (75 mg total) by mouth daily.   No facility-administered encounter medications on file as of 04/18/2020.      Chrishelle Zito Jenetta Downer, NP

## 2020-04-21 DIAGNOSIS — I10 Essential (primary) hypertension: Secondary | ICD-10-CM | POA: Diagnosis not present

## 2020-04-21 DIAGNOSIS — F0390 Unspecified dementia without behavioral disturbance: Secondary | ICD-10-CM | POA: Diagnosis not present

## 2020-04-21 DIAGNOSIS — F325 Major depressive disorder, single episode, in full remission: Secondary | ICD-10-CM | POA: Diagnosis not present

## 2020-04-21 DIAGNOSIS — Z789 Other specified health status: Secondary | ICD-10-CM | POA: Diagnosis not present

## 2020-04-21 DIAGNOSIS — R600 Localized edema: Secondary | ICD-10-CM | POA: Diagnosis not present

## 2020-04-22 DIAGNOSIS — M6281 Muscle weakness (generalized): Secondary | ICD-10-CM | POA: Diagnosis not present

## 2020-04-22 DIAGNOSIS — R262 Difficulty in walking, not elsewhere classified: Secondary | ICD-10-CM | POA: Diagnosis not present

## 2020-04-22 DIAGNOSIS — G301 Alzheimer's disease with late onset: Secondary | ICD-10-CM | POA: Diagnosis not present

## 2020-04-22 DIAGNOSIS — R279 Unspecified lack of coordination: Secondary | ICD-10-CM | POA: Diagnosis not present

## 2020-04-22 DIAGNOSIS — Z9181 History of falling: Secondary | ICD-10-CM | POA: Diagnosis not present

## 2020-04-22 DIAGNOSIS — R2689 Other abnormalities of gait and mobility: Secondary | ICD-10-CM | POA: Diagnosis not present

## 2020-04-24 DIAGNOSIS — M6281 Muscle weakness (generalized): Secondary | ICD-10-CM | POA: Diagnosis not present

## 2020-04-24 DIAGNOSIS — Z9181 History of falling: Secondary | ICD-10-CM | POA: Diagnosis not present

## 2020-04-24 DIAGNOSIS — R2689 Other abnormalities of gait and mobility: Secondary | ICD-10-CM | POA: Diagnosis not present

## 2020-04-24 DIAGNOSIS — R262 Difficulty in walking, not elsewhere classified: Secondary | ICD-10-CM | POA: Diagnosis not present

## 2020-04-24 DIAGNOSIS — R279 Unspecified lack of coordination: Secondary | ICD-10-CM | POA: Diagnosis not present

## 2020-04-24 DIAGNOSIS — G301 Alzheimer's disease with late onset: Secondary | ICD-10-CM | POA: Diagnosis not present

## 2020-04-28 DIAGNOSIS — Z20828 Contact with and (suspected) exposure to other viral communicable diseases: Secondary | ICD-10-CM | POA: Diagnosis not present

## 2020-05-05 DIAGNOSIS — Z20828 Contact with and (suspected) exposure to other viral communicable diseases: Secondary | ICD-10-CM | POA: Diagnosis not present

## 2020-05-06 DIAGNOSIS — Z9181 History of falling: Secondary | ICD-10-CM | POA: Diagnosis not present

## 2020-05-06 DIAGNOSIS — R2689 Other abnormalities of gait and mobility: Secondary | ICD-10-CM | POA: Diagnosis not present

## 2020-05-06 DIAGNOSIS — F0390 Unspecified dementia without behavioral disturbance: Secondary | ICD-10-CM | POA: Diagnosis not present

## 2020-05-06 DIAGNOSIS — M6281 Muscle weakness (generalized): Secondary | ICD-10-CM | POA: Diagnosis not present

## 2020-05-06 DIAGNOSIS — M419 Scoliosis, unspecified: Secondary | ICD-10-CM | POA: Diagnosis not present

## 2020-05-06 DIAGNOSIS — G301 Alzheimer's disease with late onset: Secondary | ICD-10-CM | POA: Diagnosis not present

## 2020-05-06 DIAGNOSIS — R279 Unspecified lack of coordination: Secondary | ICD-10-CM | POA: Diagnosis not present

## 2020-05-06 DIAGNOSIS — R262 Difficulty in walking, not elsewhere classified: Secondary | ICD-10-CM | POA: Diagnosis not present

## 2020-05-08 DIAGNOSIS — G301 Alzheimer's disease with late onset: Secondary | ICD-10-CM | POA: Diagnosis not present

## 2020-05-08 DIAGNOSIS — R279 Unspecified lack of coordination: Secondary | ICD-10-CM | POA: Diagnosis not present

## 2020-05-08 DIAGNOSIS — Z9181 History of falling: Secondary | ICD-10-CM | POA: Diagnosis not present

## 2020-05-08 DIAGNOSIS — R262 Difficulty in walking, not elsewhere classified: Secondary | ICD-10-CM | POA: Diagnosis not present

## 2020-05-08 DIAGNOSIS — F0390 Unspecified dementia without behavioral disturbance: Secondary | ICD-10-CM | POA: Diagnosis not present

## 2020-05-08 DIAGNOSIS — M6281 Muscle weakness (generalized): Secondary | ICD-10-CM | POA: Diagnosis not present

## 2020-05-14 DIAGNOSIS — G301 Alzheimer's disease with late onset: Secondary | ICD-10-CM | POA: Diagnosis not present

## 2020-05-14 DIAGNOSIS — R279 Unspecified lack of coordination: Secondary | ICD-10-CM | POA: Diagnosis not present

## 2020-05-14 DIAGNOSIS — M6281 Muscle weakness (generalized): Secondary | ICD-10-CM | POA: Diagnosis not present

## 2020-05-14 DIAGNOSIS — Z9181 History of falling: Secondary | ICD-10-CM | POA: Diagnosis not present

## 2020-05-14 DIAGNOSIS — R262 Difficulty in walking, not elsewhere classified: Secondary | ICD-10-CM | POA: Diagnosis not present

## 2020-05-14 DIAGNOSIS — F0390 Unspecified dementia without behavioral disturbance: Secondary | ICD-10-CM | POA: Diagnosis not present

## 2020-05-19 DIAGNOSIS — D1801 Hemangioma of skin and subcutaneous tissue: Secondary | ICD-10-CM | POA: Diagnosis not present

## 2020-05-19 DIAGNOSIS — D692 Other nonthrombocytopenic purpura: Secondary | ICD-10-CM | POA: Diagnosis not present

## 2020-05-19 DIAGNOSIS — S80811A Abrasion, right lower leg, initial encounter: Secondary | ICD-10-CM | POA: Diagnosis not present

## 2020-05-19 DIAGNOSIS — L814 Other melanin hyperpigmentation: Secondary | ICD-10-CM | POA: Diagnosis not present

## 2020-05-19 DIAGNOSIS — D0461 Carcinoma in situ of skin of right upper limb, including shoulder: Secondary | ICD-10-CM | POA: Diagnosis not present

## 2020-05-19 DIAGNOSIS — Z85820 Personal history of malignant melanoma of skin: Secondary | ICD-10-CM | POA: Diagnosis not present

## 2020-05-19 DIAGNOSIS — D485 Neoplasm of uncertain behavior of skin: Secondary | ICD-10-CM | POA: Diagnosis not present

## 2020-05-19 DIAGNOSIS — L821 Other seborrheic keratosis: Secondary | ICD-10-CM | POA: Diagnosis not present

## 2020-05-19 DIAGNOSIS — L570 Actinic keratosis: Secondary | ICD-10-CM | POA: Diagnosis not present

## 2020-05-19 DIAGNOSIS — Z85068 Personal history of other malignant neoplasm of small intestine: Secondary | ICD-10-CM | POA: Diagnosis not present

## 2020-05-23 ENCOUNTER — Non-Acute Institutional Stay: Payer: Medicare Other | Admitting: Adult Health Nurse Practitioner

## 2020-05-23 ENCOUNTER — Other Ambulatory Visit: Payer: Self-pay

## 2020-05-23 DIAGNOSIS — G301 Alzheimer's disease with late onset: Secondary | ICD-10-CM | POA: Diagnosis not present

## 2020-05-23 DIAGNOSIS — F02818 Dementia in other diseases classified elsewhere, unspecified severity, with other behavioral disturbance: Secondary | ICD-10-CM

## 2020-05-23 DIAGNOSIS — Z515 Encounter for palliative care: Secondary | ICD-10-CM

## 2020-05-23 DIAGNOSIS — F0281 Dementia in other diseases classified elsewhere with behavioral disturbance: Secondary | ICD-10-CM | POA: Diagnosis not present

## 2020-05-23 NOTE — Progress Notes (Signed)
[facility] Consult Note Telephone: [phone]  Fax: [phone]  PATIENT NAME: Cindy Estes DOB: 1923-07-30 MRN: [id number]  PRIMARY CARE PROVIDER:   Kirk Ruths, MD  REFERRING PROVIDER:  Kirk Ruths, MD [facility] [place],  [place] [address]  RESPONSIBLE PARTY:  Tawanda Schall, daughter [phone]    RECOMMENDATIONS and PLAN:  1.  Advanced care planning.  Patient is DNR.  Called daughter to update on visit.  Left VM with reason for call and contact info  2.  Functional status.  Patient requires assistance with ADLs including feeding.  She is incontinent of B&B.  Staff does report that she has frequent falls.  Has not had any recent major injury related to falls. Continue supportive care at facility.    3.  Nutritional status.  Staff reports poor appetite.  Does supplement with Ensure.  Patient getting adequate nutrition as her weight has increased from 131.8 pounds in August to current weight of 133.1 pounds.  Continue supplementation and monitoring weight  Patient has not had any infection or hospital visits since last visit. Palliative will continue to monitor for symptom management/decline and make recommendations as needed.  Will follow up in 6-8 weeks.    I spent 30 minutes providing this consultation,  from 9:00 to 9:30 including time spent with patient/family, chart review, provider coordination, documentation. More than 50% of the time in this consultation was spent coordinating communication.   HISTORY OF PRESENT ILLNESS:  ALLEAH Estes is a 86 y.o. year old female with multiple medical problems including Alzheimer's dementia, HLD, HTN, lymphocitic colitis, scoliosis. Palliative Care was asked to help address goals of care.   CODE STATUS: DNR  PPS: 40% HOSPICE ELIGIBILITY/DIAGNOSIS: TBD  PHYSICAL EXAM:  HR 52  O2 97% on 2L General: NAD, frail  appearing Cardiovascular: regular rate and rhythm Pulmonary: lung sounds clear; normal respiratory effort Abdomen: soft, nontender, + bowel sounds GU: no suprapubic tenderness Extremities: no edema, no joint deformities Skin: no rashes on exposed skin Neurological: Weakness; A&O to person and place    PAST MEDICAL HISTORY:  Past Medical History:  Diagnosis Date  . Anxiety    unspecified  . Chronic back pain unk  . Depression unk  . GERD (gastroesophageal reflux disease)   . History of hiatal hernia 08/08/2011  . Hyperlipidemia, unspecified   . Hypertension   . Lymphocytic colitis   . MGUS (monoclonal gammopathy of unknown significance)   . Osteopenia   . Scoliosis deformity of spine     SOCIAL HX:  Social History   Tobacco Use  . Smoking status: Never Smoker  . Smokeless tobacco: Never Used  Substance Use Topics  . Alcohol use: No    Alcohol/week: 0.0 standard drinks    ALLERGIES:  Allergies  Allergen Reactions  . Memantine Other (See Comments)    Upset stomach     PERTINENT MEDICATIONS:  Outpatient Encounter Medications as of 05/23/2020  Medication Sig  . acetaminophen (TYLENOL) 325 MG tablet Take 650 mg by mouth 4 (four) times daily.  Marland Kitchen ALPRAZolam (XANAX XR) 1 MG 24 hr tablet Take 1 tablet (1 mg total) by mouth at bedtime.  Marland Kitchen aspirin EC 81 MG tablet Take 81 mg by mouth See admin instructions. Daily on Monday , Wednesday, and Friday for antiplatelet  . cyanocobalamin (,VITAMIN B-12,) 1000 MCG/ML injection Inject 1000 mcg once a day on the 6th of the month  .  divalproex (DEPAKOTE SPRINKLE) 125 MG capsule Take 250 mg by mouth 3 (three) times daily with meals.   . donepezil (ARICEPT) 10 MG tablet Take 10 mg by mouth at bedtime. Per Gurney Maxin, MD  . lidocaine (LIDODERM) 5 % Place 1 patch onto the skin daily. Remove & Discard patch within 12 hours or as directed by MD @ 8 pm  . NON FORMULARY Diet: Regular  . Nutritional Supplements (ENSURE ENLIVE PO) Take 1  Bottle by mouth daily.   . ondansetron (ZOFRAN-ODT) 4 MG disintegrating tablet Take 4 mg by mouth every 4 (four) hours as needed for nausea or vomiting.  . valsartan-hydrochlorothiazide (DIOVAN-HCT) 160-12.5 MG tablet Take 1 tablet by mouth daily.  Marland Kitchen venlafaxine XR (EFFEXOR-XR) 75 MG 24 hr capsule Take 1 capsule (75 mg total) by mouth daily.   No facility-administered encounter medications on file as of 05/23/2020.    Fowler Antos Jenetta Downer, NP

## 2020-06-05 DIAGNOSIS — Z23 Encounter for immunization: Secondary | ICD-10-CM | POA: Diagnosis not present

## 2020-06-09 DIAGNOSIS — Z789 Other specified health status: Secondary | ICD-10-CM | POA: Diagnosis not present

## 2020-06-09 DIAGNOSIS — F0390 Unspecified dementia without behavioral disturbance: Secondary | ICD-10-CM | POA: Diagnosis not present

## 2020-06-09 DIAGNOSIS — I10 Essential (primary) hypertension: Secondary | ICD-10-CM | POA: Diagnosis not present

## 2020-06-09 DIAGNOSIS — F325 Major depressive disorder, single episode, in full remission: Secondary | ICD-10-CM | POA: Diagnosis not present

## 2020-06-23 DIAGNOSIS — D0461 Carcinoma in situ of skin of right upper limb, including shoulder: Secondary | ICD-10-CM | POA: Diagnosis not present

## 2020-09-03 ENCOUNTER — Non-Acute Institutional Stay: Payer: Medicare Other | Admitting: Adult Health Nurse Practitioner

## 2020-09-03 ENCOUNTER — Other Ambulatory Visit: Payer: Self-pay

## 2020-09-03 DIAGNOSIS — F02818 Dementia in other diseases classified elsewhere, unspecified severity, with other behavioral disturbance: Secondary | ICD-10-CM

## 2020-09-03 DIAGNOSIS — Z515 Encounter for palliative care: Secondary | ICD-10-CM

## 2020-09-03 DIAGNOSIS — F0281 Dementia in other diseases classified elsewhere with behavioral disturbance: Secondary | ICD-10-CM

## 2020-09-03 NOTE — Progress Notes (Unsigned)
[facility] Consult Note Telephone: [phone]  Fax: [phone]  PATIENT NAME: Cindy Estes DOB: 03-Apr-1924 MRN: [id number]  PRIMARY CARE PROVIDER:   Kirk Ruths, MD  REFERRING PROVIDER:  Kirk Ruths, MD [facility] [place],  [place] [address]  RESPONSIBLE PARTY:   Cindy Estes, daughter [phone]  Chief complaint: Follow-up palliative visit/debility   RECOMMENDATIONS and PLAN: 1.Advanced care planning. Patient is DNR.  Will call daughter to update on visit  2.  Dementia.  Patient requires assistance with ADLs including feeding.  She is incontinent of bowel and bladder.  She does have falls she tries to get up unassisted.  She has had a 3 pound weight loss.  In October 2021 she weighed 133.1 pounds and this month she weighs 130.1 pounds.  Continue supportive care at facility   Palliative will continue to monitor for symptom management/decline and make recommendations as needed.  We will follow up in 8 to 10 weeks.  I spent 35 minutes providing this consultation, including time spent with patient/family, provider coordination, chart review, documentation. More than 50% of the time in this consultation was spent coordinating communication.   HISTORY OF PRESENT ILLNESS:  JAMARIA AMBORN is a 87 y.o. year old female with multiple medical problems including Alzheimer's dementia, HLD, HTN, lymphocitic colitis, scoliosis. Palliative Care was asked to help address goals of care.  Patient is extremely hard of hearing making it difficult to get HPI/ROS from patient.  She is cooperative with exam but show slight frustration with not being able to understand provider.  Spoke with staff who have no new concerns.  Patient has had slight weight loss of 3 pounds over the past 4 months but seems to be stable in the 130s.  No reports of decreased appetite.Patient has not had any infection or  hospital visits since last visit.   CODE STATUS: DNR  PPS: 30% HOSPICE ELIGIBILITY/DIAGNOSIS: TBD  PHYSICAL EXAM:  HR 52  O2 98% on 2L General: NAD, frail appearing Eyes: Sclera anicteric and noninjected with no discharge noted EN MT: Moist mucous membranes Cardiovascular: regular rate and rhythm Pulmonary: lung sounds clear; normal respiratory effort Abdomen: soft, nontender, + bowel sounds Extremities: no edema, no joint deformities Skin: no rashes on exposed skin Neurological: Weakness; A&O to person and place; extremely hard of hearing  PAST MEDICAL HISTORY:  Past Medical History:  Diagnosis Date  . Anxiety    unspecified  . Chronic back pain unk  . Depression unk  . GERD (gastroesophageal reflux disease)   . History of hiatal hernia 08/08/2011  . Hyperlipidemia, unspecified   . Hypertension   . Lymphocytic colitis   . MGUS (monoclonal gammopathy of unknown significance)   . Osteopenia   . Scoliosis deformity of spine     SOCIAL HX:  Social History   Tobacco Use  . Smoking status: Never Smoker  . Smokeless tobacco: Never Used  Substance Use Topics  . Alcohol use: No    Alcohol/week: 0.0 standard drinks    ALLERGIES:  Allergies  Allergen Reactions  . Memantine Other (See Comments)    Upset stomach     PERTINENT MEDICATIONS:  Outpatient Encounter Medications as of 09/03/2020  Medication Sig  . acetaminophen (TYLENOL) 325 MG tablet Take 650 mg by mouth 4 (four) times daily.  Marland Kitchen ALPRAZolam (XANAX XR) 1 MG 24 hr tablet Take 1 tablet (1 mg total) by mouth at bedtime.  Marland Kitchen  aspirin EC 81 MG tablet Take 81 mg by mouth See admin instructions. Daily on Monday , Wednesday, and Friday for antiplatelet  . cyanocobalamin (,VITAMIN B-12,) 1000 MCG/ML injection Inject 1000 mcg once a day on the 6th of the month  . divalproex (DEPAKOTE SPRINKLE) 125 MG capsule Take 250 mg by mouth 3 (three) times daily with meals.   . donepezil (ARICEPT) 10 MG tablet Take 10 mg by mouth at  bedtime. Per Gurney Maxin, MD  . lidocaine (LIDODERM) 5 % Place 1 patch onto the skin daily. Remove & Discard patch within 12 hours or as directed by MD @ 8 pm  . NON FORMULARY Diet: Regular  . Nutritional Supplements (ENSURE ENLIVE PO) Take 1 Bottle by mouth daily.   . ondansetron (ZOFRAN-ODT) 4 MG disintegrating tablet Take 4 mg by mouth every 4 (four) hours as needed for nausea or vomiting.  . valsartan-hydrochlorothiazide (DIOVAN-HCT) 160-12.5 MG tablet Take 1 tablet by mouth daily.  Marland Kitchen venlafaxine XR (EFFEXOR-XR) 75 MG 24 hr capsule Take 1 capsule (75 mg total) by mouth daily.   No facility-administered encounter medications on file as of 09/03/2020.     Oscar Hank Jenetta Downer, NP

## 2020-12-10 ENCOUNTER — Encounter: Payer: Self-pay | Admitting: Adult Health Nurse Practitioner

## 2020-12-10 ENCOUNTER — Non-Acute Institutional Stay: Payer: Medicare Other | Admitting: Adult Health Nurse Practitioner

## 2020-12-10 ENCOUNTER — Other Ambulatory Visit: Payer: Self-pay

## 2020-12-10 VITALS — HR 100

## 2020-12-10 DIAGNOSIS — F02818 Dementia in other diseases classified elsewhere, unspecified severity, with other behavioral disturbance: Secondary | ICD-10-CM

## 2020-12-10 DIAGNOSIS — F0281 Dementia in other diseases classified elsewhere with behavioral disturbance: Secondary | ICD-10-CM

## 2020-12-10 DIAGNOSIS — Z515 Encounter for palliative care: Secondary | ICD-10-CM

## 2020-12-10 NOTE — Progress Notes (Addendum)
[facility] Consult Note Telephone: [phone]  Fax: [phone]    Date of encounter: 12/10/20 PATIENT NAME: [name] [id number]   [phone] (home) [phone] (work) DOB: Dec 01, 1923 MRN: [id number] PRIMARY CARE PROVIDER:    Kirk Ruths, MD,  [place] [address] [phone]  REFERRING PROVIDER:   Kirk Ruths, MD [facility] [place],  [place] [address] [phone]  RESPONSIBLE PARTY:    Contact Information    Name Relation Home Work Mobile   Panuco,[name] Daughter [phone]         I met face to face with patient in facility. Palliative Care was asked to follow this patient by consultation request of  Kirk Ruths, MD to address advance care planning and complex medical decision making. This is a follow up visit.  Will call daughter to update on today's visit                                   ASSESSMENT AND PLAN / RECOMMENDATIONS:   Advance Care Planning/Goals of Care: Goals include to maximize quality of life and symptom management.   CODE STATUS: DNR  Symptom Management/Plan:  Alzheimer's dementia: Patient is stable at this time.  Continues to require assistance with ADLs including feeding and is incontinent of B&B.  Continue supportive care at facility   Follow up Palliative Care Visit: Palliative care will continue to follow for complex medical decision making, advance care planning, and clarification of goals. Return 8-10 weeks or prn.  I spent 30 minutes providing this consultation. More than 50% of the time in this consultation was spent in counseling and care coordination.  PPS: 30%  HOSPICE ELIGIBILITY/DIAGNOSIS: TBD  Chief Complaint: follow up palliative visit  HISTORY OF PRESENT ILLNESS:  Cindy Estes is a 87 y.o. year old female  with Alzheimer's dementia, HLD,  HTN, lymphocitic colitis, scoliosis. Unable to contribute to HPI/ROS due to difficulty hearing.  She allows provider to do physical exam but can tell she is frustrated with not being able to hear provider.  Staff have no new concerns today.  Staff report her appetite is good.  He has not had any falls, infection, or hospital visits since last visit.  History obtained from review of EMR and interview with facility staff and Cindy Estes.    PHYSICAL EXAM:  General: NAD, frail appearing Eyes: Sclera anicteric and noninjected with no discharge noted EN MT: Moist mucous membranes Cardiovascular: regular rate and rhythm Pulmonary:lung sounds clear; normal respiratory effort Abdomen: soft, nontender, + bowel sounds Extremities: no edema, no joint deformities Skin: no rasheson exposed skin Neurological: Weakness; A&O to person and place; extremely hard of hearing  Thank you for the opportunity to participate in the care of Cindy Estes.  The palliative care team will continue to follow. Please call our office at [phone] if we can be of additional assistance.   Kervens Roper Jenetta Downer, NP , DNP  This chart was dictated using voice recognition software. Despite best efforts to proofread, errors can occur which can change the documentation meaning.   COVID-19 PATIENT SCREENING TOOL Asked and negative response unless otherwise noted:   Have you had symptoms of covid, tested positive or been in contact with someone with symptoms/positive test in the past 5-10 days? negative

## 2020-12-11 ENCOUNTER — Telehealth: Payer: Self-pay | Admitting: Adult Health Nurse Practitioner

## 2020-12-11 NOTE — Telephone Encounter (Signed)
Called to update on yesterday's visit.  Left VM with reason for call and contact info Shakerria Parran K. Olena Heckle NP

## 2021-02-18 ENCOUNTER — Non-Acute Institutional Stay: Payer: Medicare Other | Admitting: Student

## 2021-02-18 ENCOUNTER — Other Ambulatory Visit: Payer: Self-pay

## 2021-02-18 DIAGNOSIS — Z515 Encounter for palliative care: Secondary | ICD-10-CM

## 2021-02-18 DIAGNOSIS — R630 Anorexia: Secondary | ICD-10-CM

## 2021-02-18 DIAGNOSIS — G301 Alzheimer's disease with late onset: Secondary | ICD-10-CM

## 2021-02-18 NOTE — Progress Notes (Signed)
[facility] Consult Note Telephone: [phone]  Fax: [phone]    Date of encounter: 02/18/21 PATIENT NAME: Cindy Estes [id number]   [phone] (home) [phone] (work) DOB: 06/02/1924 MRN: [id number] PRIMARY CARE PROVIDER:    Kirk Ruths, MD,  [place] [address] [phone]  REFERRING PROVIDER:   Kirk Ruths, MD [facility] [place],  [place] [address] [phone]  RESPONSIBLE PARTY:    Contact Information     Name Relation Home Work Mobile   Cindy Estes,Cindy Estes Daughter [phone]          I met face to face with patient in the facility. Palliative Care was asked to follow this patient by consultation request of  Cindy Ruths, MD to address advance care planning and complex medical decision making. This is a follow up visit.                                   ASSESSMENT AND PLAN / RECOMMENDATIONS:   Advance Care Planning/Goals of Care: Goals include to maximize quality of life and symptom management.  CODE STATUS: DNR  Symptom Management/Plan:  Alzheimer's dementia-FAST score 6E. Patient is dependent for all adl's; staff to continue assisting daily care needs. Monitor for further cognitive and functional decline. Monitory for falls/safety. Continue Aricept as directed.   Appetite-patient's weight has been steady. She had several days where her appetite had started to decline, but is back to baseline per staff. Monitor for weight loss; staff encouraged to assist patient with eating as needed. Continue magic cup BID.   Follow up Palliative Care Visit: Palliative care will continue to follow for complex medical decision making, advance care planning, and clarification of goals. Return in 8 weeks or prn.  I spent 25 minutes providing this consultation. More than 50% of the time in this  consultation was spent in counseling and care coordination.   PPS: 30%  HOSPICE ELIGIBILITY/DIAGNOSIS: TBD  Chief Complaint: Palliative medicine follow up visit.  HISTORY OF PRESENT ILLNESS:  Cindy Estes is a 87 y.o. year old female  with Alzheimer's dementia-late onset,  hyperlipidemia, hypertension, lymphocytic colitis, scoliosis.   Patient resides at [facility], [facility]. Staff report patient being stable. She had a couple of days where she was not eating as well, but is back to baseline. Receives magic cup BID. She is dependent for all adl's. She is wearing oxygen at 2 lpm. Continues on aricept 66m daily for her dementia.   Patient is observed playing bingo. She is hard of hearing, must speak in her left ear. She was escorted to her room for assessment. She is able to answer some yes/no questions. Patient unable to substantially contribute to HPI and ROS due to her impaired hearing and impaired cognition.   History obtained from review of EMR, discussion with primary team, and interview with family, facility staff/caregiver and/or Cindy Estes  I reviewed available labs, medications, imaging, studies and related documents from the EMR.  Records reviewed and summarized above.   ROS  Patient unable to contribute to ROS due to impaired cognition and impaired hearing.   Physical Exam: Weight: 131 pounds Constitutional: NAD General: frail appearing  EYES: anicteric sclera, lids intact, no discharge  ENMT: intact hearing, oral mucous membranes moist, dentition intact CV: S1S2, RRR, no  LE edema Pulmonary: LCTA, no increased work of breathing, no cough, sats 99% at 2 lpm Abdomen: normo-active BS + 4 quadrants, soft and non tender GU: deferred MSK: moves all extremities, non-ambulatory Skin: warm and dry, no rashes or wounds on visible skin Neuro: generalized weakness, alert and oriented to person Psych: non-anxious affect, cooperative Hem/lymph/immuno: no widespread  bruising   Thank you for the opportunity to participate in the care of Cindy Estes.  The palliative care team will continue to follow. Please call our office at [phone] if we can be of additional assistance.   Ezekiel Slocumb, NP   COVID-19 PATIENT SCREENING TOOL Asked and negative response unless otherwise noted:   Have you had symptoms of covid, tested positive or been in contact with someone with symptoms/positive test in the past 5-10 days? No

## 2021-05-15 ENCOUNTER — Other Ambulatory Visit: Payer: Self-pay

## 2021-05-15 ENCOUNTER — Non-Acute Institutional Stay: Payer: Medicare Other | Admitting: Student

## 2021-05-15 DIAGNOSIS — Z515 Encounter for palliative care: Secondary | ICD-10-CM

## 2021-05-15 DIAGNOSIS — G301 Alzheimer's disease with late onset: Secondary | ICD-10-CM

## 2021-05-15 NOTE — Progress Notes (Signed)
[facility] Pall[facility] Consult Note Telephone: [phone]  Fax: [phone]    Date of encounter: 05/15/21 1:29 PM PATIENT NAME: Cindy Estes [place] [id number]   [phone] (home) [phone] (work) DOB: 1924/05/16 MRN: [id number] PRIMARY CARE PROVIDER:    Kirk Ruths, MD,  [place] [address] [phone]  REFERRING PROVIDER:   Kirk Ruths, MD [facility] [place],  [place] [address] [phone]  RESPONSIBLE PARTY:    Contact Information     Name Relation Home Work Mobile   Weatherholtz,[name] Daughter [phone]          I met face to face with patient in the facility. Palliative Care was asked to follow this patient by consultation request of  Kirk Ruths, MD to address advance care planning and complex medical decision making. This is a follow up visit.                                   ASSESSMENT AND PLAN / RECOMMENDATIONS:   Advance Care Planning/Goals of Care: Goals include to maximize quality of life and symptom management.    CODE STATUS: DNR  Symptom Management/Plan:  Alzheimer's dementia-FAST score 6E. Patient is dependent for all adl's; staff to continue assisting daily care needs. Monitor for further cognitive and functional decline. Monitory for falls/safety. Continue Aricept as directed. Monitor for further functional and cognitive declines.   Follow up Palliative Care Visit: Palliative care will continue to follow for complex medical decision making, advance care planning, and clarification of goals. Return in 8 weeks or prn.  I spent 15 minutes providing this consultation. More than 50% of the time in this consultation was spent in counseling and care coordination.   PPS: 30%  HOSPICE ELIGIBILITY/DIAGNOSIS: TBD  Chief Complaint: Palliative Medicine follow up visit.   HISTORY OF PRESENT  ILLNESS:  Cindy Estes is a 87 y.o. year old female  with  with Alzheimer's dementia-late onset, depression, anxiety, hyperlipidemia, hypertension, lymphocytic colitis, scoliosis.    Patient resides at [facility], [facility]. Staff report patient being stable. She is dependent for all adl's. She is wearing oxygen at 2 lpm. Continues on aricept 80m daily for her dementia. Depakote and Effexor for her mood/depression and alprazolam for anxiety. Her appetite has been fair; no weight loss noted; she continues on routine weights. No recent infections, recent ED visits or hospitalizations.  Patient is observed sitting up to recliner. She is very hard of hearing. She is able to answer few questions.   History obtained from review of EMR, discussion with primary team, and interview with family, facility staff/caregiver and/or Ms. ROdetta Estes  I reviewed available labs, medications, imaging, studies and related documents from the EMR.  Records reviewed and summarized above.   ROS  Patient unable to contribute to ROS due to impaired cognition and impaired hearing.    Physical Exam: Weight: 130.1 pounds Pulse 92, resp 20, b/p 128/78, sats 96% at 2 lpm Constitutional: NAD General: frail appearing EYES: anicteric sclera, lids intact, no discharge  ENMT: intact hearing, oral mucous membranes moist, dentition intact CV: S1S2, RRR, no LE edema Pulmonary: LCTA, no increased work of breathing, no cough Abdomen: normo-active BS + 4 quadrants, soft and non tender GU: deferred MSK: moves all extremities, non-ambulatory Skin: warm and dry, no rashes or  wounds on visible skin Neuro: generalized weakness,alert and oriented to person Psych: non-anxious affect, cooperative Hem/lymph/immuno: no widespread bruising   Thank you for the opportunity to participate in the care of Cindy Estes.  The palliative care team will continue to follow. Please call our office at [phone] if we can be of additional assistance.    Ezekiel Slocumb, NP   COVID-19 PATIENT SCREENING TOOL Asked and negative response unless otherwise noted:   Have you had symptoms of covid, tested positive or been in contact with someone with symptoms/positive test in the past 5-10 days? No

## 2021-06-02 ENCOUNTER — Non-Acute Institutional Stay: Payer: Medicare Other | Admitting: Student

## 2021-06-02 ENCOUNTER — Other Ambulatory Visit: Payer: Self-pay

## 2021-06-02 DIAGNOSIS — G301 Alzheimer's disease with late onset: Secondary | ICD-10-CM

## 2021-06-02 DIAGNOSIS — Z515 Encounter for palliative care: Secondary | ICD-10-CM

## 2021-06-02 NOTE — Progress Notes (Signed)
[facility] Pall[facility] Consult Note Telephone: [phone]  Fax: [phone]    Date of encounter: 06/02/21  PATIENT NAME: [place] [id number]   [phone] (home) [phone] (work) DOB: 1924-02-22 MRN: [id number] PRIMARY CARE PROVIDER:    Kirk Ruths, MD,  [place] [address] [phone]  REFERRING PROVIDER:   Kirk Ruths, MD Cindy Estes Cindy Estes,  [place] [address] [phone]  RESPONSIBLE PARTY:    Contact Information     Name Relation Home Work Mobile   Cindy Estes,Cindy Estes Daughter [phone]          I met face to face with patient in the facility. Palliative Care was asked to follow this patient by consultation request of  Cindy Ruths, MD to address advance care planning and complex medical decision making. This is a follow up visit.                                   ASSESSMENT AND PLAN / RECOMMENDATIONS:   Advance Care Planning/Goals of Care: Goals include to maximize quality of life and symptom management.   CODE STATUS: DNR  Symptom Management/Plan:  Alzheimer's dementia with agitation-FAST score 6E. Patient with worsening agitation. Followed by psychiatry; seen yesterday and medications adjusted. Will defer management to psychiatry for her behaviors/agitation. Continue Depakote at new dosage  250 mg TID, Effexor XR 150 mg daily, olanzapine 2.5 mg QHS, alprazolam 0.5 mg each morning and 1 mg QHS as directed. Patient has haldol PRN agitation through 06/14/21. Patient is dependent for all adl's; staff to continue assisting daily care needs. Monitor for further cognitive and functional decline. Monitory for falls/safety. Continue Aricept as directed. Monitor for further functional and cognitive declines.    Follow up Palliative Care Visit: Palliative care will continue to follow for complex  medical decision making, advance care planning, and clarification of goals. Return in 6-8 weeks or prn.  I spent 20 minutes providing this consultation. More than 50% of the time in this consultation was spent in counseling and care coordination.   PPS: 30%  HOSPICE ELIGIBILITY/DIAGNOSIS: TBD  Chief Complaint: Palliative Medicine follow up visit.  HISTORY OF PRESENT ILLNESS:  Cindy Estes is a 87 y.o. year old female  with Alzheimer's dementia-late onset, depression, anxiety, hyperlipidemia, hypertension, lymphocytic colitis, scoliosis.    Patient resides at [facility], [facility]. Nursing staff report patient recently yelling out more, increased agitation, combative with staff. Nurse Cindy Estes states patient has periods where she is calm, alternating with yelling out. Staff report her being calm today.Cindy Estes states that patient was seen by psych yesterday and medications were adjusted. She denies any other needs at this time. No functional declines reported. Patient is very hard of hearing; communicates via dry erase board.  Patient received resting in recliner. She is calm; no anxiety or agitation observed. She is able to answer questions when written on dry erase board. She is wearing oxygen at 2 lpm. Patient able to respond to direct questions. HPI and ROS primarily obtained from staff due to her impaired cognition.     History obtained from review of EMR, discussion with primary team, and interview with family, facility staff/caregiver and/or Cindy Estes.  I reviewed available labs, medications, imaging, studies and related documents from the EMR.  Records reviewed and summarized  above.    Physical Exam: Pulse 72, resp 20, sats 95% at 2 lpm Constitutional: NAD General: frail appearing EYES: anicteric sclera, lids intact, no discharge  ENMT: intact hearing, oral mucous membranes moist CV: S1S2, RRR, no LE edema Pulmonary: LCTA, no increased work of breathing, no cough, room  air Abdomen: normo-active BS + 4 quadrants, soft and non tender GU: deferred MSK: moves all extremities, non-ambulatory Skin: warm and dry, no rashes or wounds on visible skin Neuro: generalized weakness, alert and oriented to person Psych: non-anxious affect Hem/lymph/immuno: no widespread bruising   Thank you for the opportunity to participate in the care of Cindy Estes.  The palliative care team will continue to follow. Please call our office at [phone] if we can be of additional assistance.   Cindy Slocumb, NP   COVID-19 PATIENT SCREENING TOOL Asked and negative response unless otherwise noted:   Have you had symptoms of covid, tested positive or been in contact with someone with symptoms/positive test in the past 5-10 days? No

## 2021-06-15 ENCOUNTER — Non-Acute Institutional Stay: Payer: Medicare Other | Admitting: Student

## 2021-06-15 ENCOUNTER — Other Ambulatory Visit: Payer: Self-pay

## 2021-06-15 DIAGNOSIS — F02811 Dementia in other diseases classified elsewhere, unspecified severity, with agitation: Secondary | ICD-10-CM

## 2021-06-15 DIAGNOSIS — Z515 Encounter for palliative care: Secondary | ICD-10-CM

## 2021-06-15 DIAGNOSIS — G301 Alzheimer's disease with late onset: Secondary | ICD-10-CM

## 2021-06-15 NOTE — Progress Notes (Signed)
[facility] Pall[facility] Consult Note Telephone: [phone]  Fax: [phone]    Date of encounter: 06/15/21 4:07 PM PATIENT NAME: Cindy Estes [id number]   [phone] (home) [phone] (work) DOB: October 01, 1981 MRN: [id number] PRIMARY CARE PROVIDER:    Kirk Ruths, MD,  [place] [address] [phone]  REFERRING PROVIDER:   Kirk Ruths, MD [facility] [place],  [place] [address] [phone]  RESPONSIBLE PARTY:    Contact Information     Name Relation Home Work Mobile   Cindy Estes Daughter [phone]          I met face to face with patient and family in the facility. Palliative Care was asked to follow this patient by consultation request of  Kirk Ruths, MD to address advance care planning and complex medical decision making. This is a follow up visit.                                   ASSESSMENT AND PLAN / RECOMMENDATIONS:   Advance Care Planning/Goals of Care: Goals include to maximize quality of life and symptom management. Our advance care planning conversation included a discussion about:     CODE STATUS: DNR  Patient with marked decline in past 2 weeks. Given her recent changes/declines. Will discuss with hospice medical director regarding eligibility. Daughter is in agreement with hospice evaluation. Palliative medicine will continue to provide support to patient and family through transition to hospice services.   Symptom Management/Plan:  Alzheimer's dementia-FAST score 7D. Patient with functional and cognitive decline. She is now dependent for all adl's. Needing to be fed; monitor for aspiration. Continue mechanical soft diet. Patient with 10 pound weight loss in the past 3 weeks. Monitor for falls/safety. Patient being referred to hospice.   Agitation-patient with worsening agitation.  Staff report worsening since Depakote dosage increased and starting Zyprexa. Will discontinue Zyprexa as this could be contributing to worsening behaviors. Continue Depakote as directed. Haldol ordered every 8 hours PRN; frequency changed to every 4 hours PRN.  Follow up Palliative Care Visit: Palliative care will continue to follow for complex medical decision making, advance care planning, and clarification of goals. Return prn.  I spent 40 minutes providing this consultation. More than 50% of the time in this consultation was spent in counseling and care coordination.   PPS: 20%  HOSPICE ELIGIBILITY/DIAGNOSIS: TBD  Chief Complaint: Palliative Medicine follow up visit.   HISTORY OF PRESENT ILLNESS:  Cindy Estes is a 87 y.o. year old female  with  Alzheimer's dementia-late onset, depression, anxiety, hyperlipidemia, hypertension, lymphocytic colitis, scoliosis. Hx of respiratory failure-oxygen dependent.  Facility staff request patient to be evaluated due to marked decline in the past 2 weeks. She is now a FAST score 7D; previously a 7A. Patient now only speaking 1-2 words; was able to communicate via dry erase board, no longer doing this. She is no longer getting out of bed, increased weakness. She has worsening agitation; yelling out frequently. She was started on haldol prn every 8 hours; staff reports it helping but not lasting 8 hours. Staff also report agitation worsened with recent medication changes; Depakote was increased and Zyprexa started.  She was able to feed herself a week ago; no longer able to feed herself. She is having increased difficulty chewing;  diet down graded to mechanical soft. Eating around 15% of meals. Treated for a UTI in past month. Weight loss noted in past 3 weeks. She was 130 pounds 05/23/21, now 120 pounds. BMI 22.6. PPS now 20%. HPI and ROS obtained from facility staff due to patient's impaired cognition.     Discussed with daughter via telephone. She is in  agreement with keeping patient comfortable at facility and proceeding with hospice evaluation.   History obtained from review of EMR, discussion with primary team, and interview with family, facility staff/caregiver and/or [name].  I reviewed available labs, medications, imaging, studies and related documents from the EMR.  Records reviewed and summarized above.   ROS  Patient unable to contribute due to her advanced dementia.   Physical Exam: Pulse 92, resp 20, b/p 118/ 70, sats 97% at 2 lpm Constitutional: NAD General: frail appearing  EYES: anicteric sclera, lids intact, no discharge  ENMT: intact hearing, oral mucous membranes dry, dentition intact CV: S1S2, RRR, no LE edema Pulmonary: LCTA, no increased work of breathing, no cough Abdomen: normo-active BS + 4 quadrants, soft and non tender, GU: deferred MSK: sarcopenia, non-ambulatory Skin: warm and dry, no rashes or wounds on visible skin Neuro: generalized weakness, alert, disoriented Psych: agitated Hem/lymph/immuno: no widespread bruising   Thank you for the opportunity to participate in the care of [name].  The palliative care team will continue to follow. Please call our office at [phone] if we can be of additional assistance.   Ezekiel Slocumb, NP   COVID-19 PATIENT SCREENING TOOL Asked and negative response unless otherwise noted:   Have you had symptoms of covid, tested positive or been in contact with someone with symptoms/positive test in the past 5-10 days? No
